# Patient Record
Sex: Female | Born: 1935 | Race: White | Hispanic: No | Smoking: Never smoker
Health system: Southern US, Community
[De-identification: ages and names within clinical notes are randomized; demographics above are authoritative.]

## PROBLEM LIST (undated history)

## (undated) DIAGNOSIS — L089 Local infection of the skin and subcutaneous tissue, unspecified: Secondary | ICD-10-CM

## (undated) DIAGNOSIS — K219 Gastro-esophageal reflux disease without esophagitis: Secondary | ICD-10-CM

## (undated) DIAGNOSIS — F419 Anxiety disorder, unspecified: Secondary | ICD-10-CM

## (undated) DIAGNOSIS — M545 Low back pain, unspecified: Secondary | ICD-10-CM

## (undated) DIAGNOSIS — G4700 Insomnia, unspecified: Secondary | ICD-10-CM

## (undated) DIAGNOSIS — R413 Other amnesia: Principal | ICD-10-CM

## (undated) DIAGNOSIS — R32 Unspecified urinary incontinence: Secondary | ICD-10-CM

## (undated) DIAGNOSIS — R7303 Prediabetes: Secondary | ICD-10-CM

## (undated) DIAGNOSIS — R0600 Dyspnea, unspecified: Secondary | ICD-10-CM

## (undated) DIAGNOSIS — E669 Obesity, unspecified: Secondary | ICD-10-CM

## (undated) DIAGNOSIS — F329 Major depressive disorder, single episode, unspecified: Secondary | ICD-10-CM

## (undated) DIAGNOSIS — F32A Depression, unspecified: Secondary | ICD-10-CM

## (undated) DIAGNOSIS — E039 Hypothyroidism, unspecified: Secondary | ICD-10-CM

## (undated) DIAGNOSIS — H3581 Retinal edema: Secondary | ICD-10-CM

## (undated) DIAGNOSIS — M1990 Unspecified osteoarthritis, unspecified site: Secondary | ICD-10-CM

## (undated) DIAGNOSIS — G4730 Sleep apnea, unspecified: Secondary | ICD-10-CM

## (undated) DIAGNOSIS — I10 Essential (primary) hypertension: Secondary | ICD-10-CM

## (undated) HISTORY — DX: Insomnia, unspecified: G47.00

## (undated) HISTORY — DX: Low back pain, unspecified: M54.50

## (undated) HISTORY — DX: Major depressive disorder, single episode, unspecified: F32.9

## (undated) HISTORY — DX: Anxiety disorder, unspecified: F41.9

## (undated) HISTORY — DX: Depression, unspecified: F32.A

## (undated) HISTORY — DX: Essential (primary) hypertension: I10

## (undated) HISTORY — PX: CHOLECYSTECTOMY: SHX55

## (undated) HISTORY — DX: Gastro-esophageal reflux disease without esophagitis: K21.9

## (undated) HISTORY — DX: Other amnesia: R41.3

## (undated) HISTORY — PX: LUMBAR DISC SURGERY: SHX700

## (undated) HISTORY — PX: OTHER SURGICAL HISTORY: SHX169

## (undated) HISTORY — DX: Low back pain: M54.5

## (undated) HISTORY — DX: Unspecified osteoarthritis, unspecified site: M19.90

## (undated) HISTORY — PX: KNEE SURGERY: SHX244

## (undated) HISTORY — DX: Obesity, unspecified: E66.9

## (undated) HISTORY — DX: Hypothyroidism, unspecified: E03.9

## (undated) HISTORY — DX: Retinal edema: H35.81

## (undated) HISTORY — PX: PARATHYROIDECTOMY: SHX19

---

## 1999-09-12 ENCOUNTER — Encounter: Payer: Self-pay | Admitting: Orthopedic Surgery

## 1999-09-18 ENCOUNTER — Inpatient Hospital Stay (HOSPITAL_COMMUNITY): Admission: RE | Admit: 1999-09-18 | Discharge: 1999-09-21 | Payer: Self-pay | Admitting: Orthopedic Surgery

## 1999-09-21 ENCOUNTER — Inpatient Hospital Stay (HOSPITAL_COMMUNITY)
Admission: RE | Admit: 1999-09-21 | Discharge: 1999-09-25 | Payer: Self-pay | Admitting: Physical Medicine & Rehabilitation

## 1999-09-25 ENCOUNTER — Emergency Department (HOSPITAL_COMMUNITY): Admission: EM | Admit: 1999-09-25 | Discharge: 1999-09-25 | Payer: Self-pay | Admitting: Emergency Medicine

## 1999-09-25 ENCOUNTER — Encounter: Payer: Self-pay | Admitting: Emergency Medicine

## 1999-10-03 ENCOUNTER — Inpatient Hospital Stay (HOSPITAL_COMMUNITY): Admission: RE | Admit: 1999-10-03 | Discharge: 1999-10-06 | Payer: Self-pay | Admitting: Orthopedic Surgery

## 1999-10-11 ENCOUNTER — Encounter: Payer: Self-pay | Admitting: Emergency Medicine

## 1999-10-11 ENCOUNTER — Emergency Department (HOSPITAL_COMMUNITY): Admission: EM | Admit: 1999-10-11 | Discharge: 1999-10-11 | Payer: Self-pay | Admitting: Emergency Medicine

## 2000-03-18 ENCOUNTER — Inpatient Hospital Stay (HOSPITAL_COMMUNITY): Admission: EM | Admit: 2000-03-18 | Discharge: 2000-03-24 | Payer: Self-pay | Admitting: Orthopedic Surgery

## 2000-03-21 ENCOUNTER — Encounter: Payer: Self-pay | Admitting: Orthopedic Surgery

## 2001-07-15 ENCOUNTER — Encounter: Payer: Self-pay | Admitting: Orthopedic Surgery

## 2001-07-17 ENCOUNTER — Inpatient Hospital Stay (HOSPITAL_COMMUNITY): Admission: RE | Admit: 2001-07-17 | Discharge: 2001-07-20 | Payer: Self-pay | Admitting: Orthopedic Surgery

## 2001-09-21 ENCOUNTER — Inpatient Hospital Stay (HOSPITAL_COMMUNITY): Admission: RE | Admit: 2001-09-21 | Discharge: 2001-09-23 | Payer: Self-pay | Admitting: Orthopedic Surgery

## 2001-09-23 ENCOUNTER — Inpatient Hospital Stay (HOSPITAL_COMMUNITY)
Admission: AD | Admit: 2001-09-23 | Discharge: 2001-10-02 | Payer: Self-pay | Admitting: Physical Medicine & Rehabilitation

## 2001-11-16 ENCOUNTER — Inpatient Hospital Stay (HOSPITAL_COMMUNITY): Admission: RE | Admit: 2001-11-16 | Discharge: 2001-11-25 | Payer: Self-pay | Admitting: Orthopedic Surgery

## 2001-11-17 ENCOUNTER — Encounter: Payer: Self-pay | Admitting: Orthopedic Surgery

## 2001-11-25 ENCOUNTER — Inpatient Hospital Stay (HOSPITAL_COMMUNITY)
Admission: RE | Admit: 2001-11-25 | Discharge: 2001-12-04 | Payer: Self-pay | Admitting: Physical Medicine & Rehabilitation

## 2001-12-03 ENCOUNTER — Encounter: Payer: Self-pay | Admitting: Physical Medicine & Rehabilitation

## 2002-01-01 ENCOUNTER — Inpatient Hospital Stay (HOSPITAL_COMMUNITY): Admission: RE | Admit: 2002-01-01 | Discharge: 2002-01-08 | Payer: Self-pay | Admitting: Orthopedic Surgery

## 2002-01-01 ENCOUNTER — Encounter (INDEPENDENT_AMBULATORY_CARE_PROVIDER_SITE_OTHER): Payer: Self-pay | Admitting: *Deleted

## 2002-01-01 ENCOUNTER — Encounter: Payer: Self-pay | Admitting: Orthopedic Surgery

## 2002-07-07 ENCOUNTER — Ambulatory Visit (HOSPITAL_COMMUNITY): Admission: RE | Admit: 2002-07-07 | Discharge: 2002-07-07 | Payer: Self-pay | Admitting: Family Medicine

## 2002-09-13 ENCOUNTER — Other Ambulatory Visit: Admission: RE | Admit: 2002-09-13 | Discharge: 2002-09-13 | Payer: Self-pay | Admitting: Family Medicine

## 2003-01-12 ENCOUNTER — Ambulatory Visit (HOSPITAL_COMMUNITY): Admission: RE | Admit: 2003-01-12 | Discharge: 2003-01-12 | Payer: Self-pay | Admitting: Family Medicine

## 2003-02-11 ENCOUNTER — Encounter: Payer: Self-pay | Admitting: Neurological Surgery

## 2003-02-16 ENCOUNTER — Inpatient Hospital Stay (HOSPITAL_COMMUNITY): Admission: RE | Admit: 2003-02-16 | Discharge: 2003-02-18 | Payer: Self-pay | Admitting: Neurological Surgery

## 2003-02-16 ENCOUNTER — Encounter: Payer: Self-pay | Admitting: Neurological Surgery

## 2003-03-29 ENCOUNTER — Ambulatory Visit: Admission: RE | Admit: 2003-03-29 | Discharge: 2003-03-29 | Payer: Self-pay | Admitting: Unknown Physician Specialty

## 2004-02-03 ENCOUNTER — Encounter: Admission: RE | Admit: 2004-02-03 | Discharge: 2004-02-03 | Payer: Self-pay | Admitting: Neurological Surgery

## 2005-02-13 ENCOUNTER — Ambulatory Visit (HOSPITAL_COMMUNITY): Admission: RE | Admit: 2005-02-13 | Discharge: 2005-02-13 | Payer: Self-pay | Admitting: Gastroenterology

## 2005-11-19 ENCOUNTER — Inpatient Hospital Stay (HOSPITAL_COMMUNITY): Admission: EM | Admit: 2005-11-19 | Discharge: 2005-11-24 | Payer: Self-pay | Admitting: Emergency Medicine

## 2007-02-24 ENCOUNTER — Ambulatory Visit: Payer: Self-pay | Admitting: Endocrinology

## 2007-02-24 ENCOUNTER — Encounter: Payer: Self-pay | Admitting: Endocrinology

## 2007-02-24 ENCOUNTER — Encounter: Payer: Self-pay | Admitting: *Deleted

## 2007-02-24 DIAGNOSIS — M545 Low back pain, unspecified: Secondary | ICD-10-CM | POA: Insufficient documentation

## 2007-02-24 DIAGNOSIS — Z862 Personal history of diseases of the blood and blood-forming organs and certain disorders involving the immune mechanism: Secondary | ICD-10-CM | POA: Insufficient documentation

## 2007-02-24 DIAGNOSIS — E039 Hypothyroidism, unspecified: Secondary | ICD-10-CM | POA: Insufficient documentation

## 2007-02-24 DIAGNOSIS — K219 Gastro-esophageal reflux disease without esophagitis: Secondary | ICD-10-CM | POA: Insufficient documentation

## 2007-02-24 DIAGNOSIS — F3289 Other specified depressive episodes: Secondary | ICD-10-CM | POA: Insufficient documentation

## 2007-02-24 DIAGNOSIS — Z8639 Personal history of other endocrine, nutritional and metabolic disease: Secondary | ICD-10-CM

## 2007-02-24 DIAGNOSIS — I10 Essential (primary) hypertension: Secondary | ICD-10-CM | POA: Insufficient documentation

## 2007-02-24 DIAGNOSIS — F329 Major depressive disorder, single episode, unspecified: Secondary | ICD-10-CM

## 2007-02-24 LAB — CONVERTED CEMR LAB: PTH: 131.7 pg/mL — ABNORMAL HIGH (ref 14.0–72.0)

## 2007-03-30 ENCOUNTER — Encounter: Payer: Self-pay | Admitting: Endocrinology

## 2008-02-14 ENCOUNTER — Ambulatory Visit (HOSPITAL_BASED_OUTPATIENT_CLINIC_OR_DEPARTMENT_OTHER): Admission: RE | Admit: 2008-02-14 | Discharge: 2008-02-14 | Payer: Self-pay | Admitting: Family Medicine

## 2008-02-20 ENCOUNTER — Ambulatory Visit: Payer: Self-pay | Admitting: Internal Medicine

## 2008-07-27 ENCOUNTER — Encounter: Admission: RE | Admit: 2008-07-27 | Discharge: 2008-08-24 | Payer: Self-pay | Admitting: Family Medicine

## 2008-11-10 ENCOUNTER — Emergency Department (HOSPITAL_COMMUNITY): Admission: EM | Admit: 2008-11-10 | Discharge: 2008-11-10 | Payer: Self-pay | Admitting: Emergency Medicine

## 2008-12-15 ENCOUNTER — Encounter (HOSPITAL_COMMUNITY): Admission: RE | Admit: 2008-12-15 | Discharge: 2009-03-15 | Payer: Self-pay | Admitting: General Surgery

## 2009-02-06 ENCOUNTER — Encounter (INDEPENDENT_AMBULATORY_CARE_PROVIDER_SITE_OTHER): Payer: Self-pay | Admitting: General Surgery

## 2009-02-06 ENCOUNTER — Ambulatory Visit (HOSPITAL_COMMUNITY): Admission: RE | Admit: 2009-02-06 | Discharge: 2009-02-08 | Payer: Self-pay | Admitting: General Surgery

## 2009-06-13 ENCOUNTER — Encounter: Payer: Self-pay | Admitting: Internal Medicine

## 2009-06-27 ENCOUNTER — Encounter: Payer: Self-pay | Admitting: Internal Medicine

## 2009-07-13 ENCOUNTER — Encounter: Payer: Self-pay | Admitting: Internal Medicine

## 2009-07-13 ENCOUNTER — Ambulatory Visit (HOSPITAL_BASED_OUTPATIENT_CLINIC_OR_DEPARTMENT_OTHER): Admission: RE | Admit: 2009-07-13 | Discharge: 2009-07-13 | Payer: Self-pay | Admitting: Allergy and Immunology

## 2009-07-15 ENCOUNTER — Ambulatory Visit: Payer: Self-pay | Admitting: Internal Medicine

## 2009-08-18 ENCOUNTER — Ambulatory Visit: Payer: Self-pay | Admitting: Internal Medicine

## 2009-08-18 DIAGNOSIS — G4730 Sleep apnea, unspecified: Secondary | ICD-10-CM

## 2009-08-18 DIAGNOSIS — G4700 Insomnia, unspecified: Secondary | ICD-10-CM | POA: Insufficient documentation

## 2009-08-21 DIAGNOSIS — J309 Allergic rhinitis, unspecified: Secondary | ICD-10-CM | POA: Insufficient documentation

## 2009-08-30 ENCOUNTER — Telehealth: Payer: Self-pay | Admitting: Internal Medicine

## 2009-08-31 ENCOUNTER — Encounter: Payer: Self-pay | Admitting: Internal Medicine

## 2009-09-15 ENCOUNTER — Encounter: Payer: Self-pay | Admitting: Internal Medicine

## 2009-09-24 ENCOUNTER — Encounter: Payer: Self-pay | Admitting: Internal Medicine

## 2010-06-02 ENCOUNTER — Encounter: Payer: Self-pay | Admitting: Neurological Surgery

## 2010-06-12 NOTE — Letter (Signed)
Summary: [facility]  [facility]   Imported By: Lester McLain 08/29/2009 09:20:19  _____________________________________________________________________  External Attachment:    Type:   Image     Comment:   External Document

## 2010-06-12 NOTE — Progress Notes (Signed)
Summary: status of order  Phone Note Call from Patient Call back at Home Phone [phone]   Caller: Patient Call For: young Summary of Call: Checking on the status of cpap order. Initial call taken by: Darletta Moll,  August 30, 2009 11:48 AM  Follow-up for Phone Call        CY sent order to start pt on CPAP on 08-18-2009.  Pt calling stating she still hasn't heard anything from DME company.  Will forward message to PCCs to addres.  Aundra Millet Reynolds LPN  August 30, 2009 11:57 AM   Additional Follow-up for Phone Call Additional follow up Details #1::        lmtcb with [facility] Kristen who is filling in for [place] at [facility]. Stated that she called office and they told her they didn't have an order. Reprinted order and gave order to [facility] and asked that they contact Mr. Arscott today. Rhonda Cobb  August 30, 2009 12:54 PM

## 2010-06-12 NOTE — Assessment & Plan Note (Signed)
Summary: sleep apnea/ mbw   Copy to:  karam Primary Provider/Referring Provider:  Della Goo  CC:  Pulmonary Consult-Dr. Lucie Leather..  History of Present Illness: August 18, 2009- 73 yoF referred by Dr Lucie Leather concerned about sleep problems. She says she has been a a poor sleeper much of her life, with difficulty initiating and maintaining sleep. She is widowed now and alone with no reports about sleep. She had used little medication, until recently Dr Lovell Sheehan has tried trazodone which hasn't helped. A combination of valium for sleep mixed with hydrocodone for pain caused confusion. Bedtime 11PM-1AM. Sleep latency 15 minutes to 2 hours, waking 2-5 x/ night before up at 730AM. Feels sleepy in day, but "busy brain" and can't sleep. 3-4 cups pf coffee daily. Feels badly/ malaise til noon. NPSG- 07/13/09- Delayed sleep onset 1230AM despite trazodone and oxycodone.AHI 3.5/hr, RDI 29.9/hr.  Current Medications (verified): 1)  Protonix 40 Mg  Solr (Pantoprazole Sodium) .... Take 1 By Mouth Qd 2)  Ibuprofen 800 Mg  Tabs (Ibuprofen) .... Take 1 By Mouth Two Times A Day As Needed 3)  Diphenhydramine Hcl 25 Mg  Caps (Diphenhydramine Hcl) .... Take 1 By Mouth At Bedtime As Needed 4)  Zantac 300 Mg Tabs (Ranitidine Hcl) .... Take 1 By Mouth Once Daily 5)  Pantoprazole Sodium 40 Mg Tbec (Pantoprazole Sodium) .... Take 1 By Mouth Once Daily 6)  Zyrtec Hives Relief 10 Mg Tabs (Cetirizine Hcl) .... Take 1 By Mouth Once Daily 7)  Trazodone Hcl 50 Mg Tabs (Trazodone Hcl) .... Take 2-3 At Bedtime As Needed Sleep 8)  Vitamin B-12 2500 Mcg Subl (Cyanocobalamin) .... Once Daily 9)  Magnesium 250 Mg Tabs (Magnesium) .... Take 1 By Mouth Once Daily 10)  Biotin 1000 Mcg Tabs (Biotin) .... Take 1 By Mouth Once Daily  Allergies (verified): 1)  ! Penicillin 2)  ! Cipro 3)  ! Valium 4)  ! * Zovirax  Past History:  Family History: Last updated: 08/18/2009 negative for parathyroid disease Hx of allergies in  family; pt unsure of who had/has allergies. Cancer: mother, father, both grandmothers, cousins; High rate of family members with Cancer.  Social History: Last updated: 08/18/2009 patient widowed and retired;lives alone; Has children Non smoker No ETOH Disabled due to knee injury  Risk Factors: Smoking Status: never (02/24/2007)  Past Medical History: LOW BACK PAIN (ICD-724.2) DEPRESSION (ICD-311) HYPERTENSION (ICD-401.9) GERD (ICD-530.81) HYPOTHYROIDISM NOS (ICD-244.9) HYPERPARATHYROIDISM, HX OF (ICD-V12.2) Insomnia with sleep apnea- NPSG 07/13/09- AHI 3.5, RDI 29.9/hr. Allergic Rhinitis  Past Surgical History: Cholecystectomy Lumbar disc surgery Parathyroidectomy  Family History: negative for parathyroid disease Hx of allergies in family; pt unsure of who had/has allergies. Cancer: mother, father, both grandmothers, cousins; High rate of family members with Cancer.  Social History: patient widowed and retired;lives alone; Has children Non smoker No ETOH Disabled due to knee injury  Review of Systems      See HPI       The patient complains of shortness of breath with activity, non-productive cough, acid heartburn, and sore throat.  The patient denies shortness of breath at rest, productive cough, coughing up blood, chest pain, irregular heartbeats, indigestion, loss of appetite, weight change, abdominal pain, difficulty swallowing, tooth/dental problems, headaches, nasal congestion/difficulty breathing through nose, sneezing, itching, ear ache, anxiety, depression, hand/feet swelling, joint stiffness or pain, rash, change in color of mucus, and fever.    Vital Signs:  Patient profile:   75 year old female Height:      58 inches  Weight:      196.13 pounds BMI:     41.14 O2 Sat:      97 % on Room air Pulse rate:   88 / minute BP sitting:   114 / 80  (right arm) Cuff size:   regular  Vitals Entered By: Reynaldo Minium CMA (August 18, 2009 2:36 PM)  O2 Flow:  Room  air  Physical Exam  Additional Exam:  General: A/Ox3; pleasant and cooperative, NAD, overweight, talkative SKIN: no rash, lesions NODES: no lymphadenopathy HEENT: [place]/AT, EOM- WNL, Conjuctivae- clear, PERRLA, TM-WNL, Nose- clear, Throat- clear and wnl, Mallampati  III NECK: Supple w/ fair ROM, JVD- none, normal carotid impulses w/o bruits Thyroid- normal to palpation CHEST: Clear to P&A HEART: RRR, no m/g/r heard ABDOMEN: Soft and nl; nml bowel sounds; ZOX:WRUE, nl pulses, no edema  NEURO: Grossly intact to observation      Impression & Recommendations:  Problem # 1:  INSOMNIA WITH SLEEP APNEA UNSPECIFIED (ICD-780.51)  The insomnia is chronic, but there may be enough sleep apnea to tie this together. We have discused medical and management options and will try autotitrating CPAP. Also, we will try changing trazodone, which hasn't helped, to temazepam. We spent time discussing sleep hygiene. We will want to emphasize other treatment and support, including cognitive behavioral therapy, in the future.  Medications Added to Medication List This Visit: 1)  Ibuprofen 800 Mg Tabs (Ibuprofen) .... Take 1 by mouth two times a day as needed 2)  Diphenhydramine Hcl 25 Mg Caps (Diphenhydramine hcl) .... Take 1 by mouth at bedtime as needed 3)  Zantac 300 Mg Tabs (Ranitidine hcl) .... Take 1 by mouth once daily 4)  Pantoprazole Sodium 40 Mg Tbec (Pantoprazole sodium) .... Take 1 by mouth once daily 5)  Zyrtec Hives Relief 10 Mg Tabs (Cetirizine hcl) .... Take 1 by mouth once daily 6)  Trazodone Hcl 50 Mg Tabs (Trazodone hcl) .... Take 2-3 at bedtime as needed sleep 7)  Temazepam 15 Mg Caps (Temazepam) .Marland Kitchen.. 1-2 for sleep if needed 8)  Vitamin B-12 2500 Mcg Subl (Cyanocobalamin) .... Once daily 9)  Magnesium 250 Mg Tabs (Magnesium) .... Take 1 by mouth once daily 10)  Biotin 1000 Mcg Tabs (Biotin) .... Take 1 by mouth once daily 11)  Cpap   Other Orders: Consultation Level IV (45409) DME  Referral (DME)  Patient Instructions: 1)  Please schedule a follow-up appointment in 6 weeks for CPAP follow-up 2)  See PCC to start CPAP 3)  Try script temazepam instead of trazodone as a sleep aid. Prescriptions: TEMAZEPAM 15 MG CAPS (TEMAZEPAM) 1-2 for sleep if needed  #50 x 2   Entered and Authorized by:   Waymon Budge MD   Signed by:   Waymon Budge MD on 08/18/2009   Method used:   Print then Give to Patient   RxID:   [id number]

## 2010-06-12 NOTE — Letter (Signed)
Summary: [facility]  [facility]   Imported By: Lester Woodall 08/29/2009 09:21:59  _____________________________________________________________________  External Attachment:    Type:   Image     Comment:   External Document

## 2010-06-12 NOTE — Letter (Signed)
Summary: CMN for CPAP Supplies/[facility]  CMN for CPAP Supplies/[facility]   Imported By: Sherian Rein 09/29/2009 14:26:33  _____________________________________________________________________  External Attachment:    Type:   Image     Comment:   External Document

## 2010-08-17 LAB — CBC
Hemoglobin: 14.3 g/dL (ref 12.0–15.0)
Platelets: 206 10*3/uL (ref 150–400)
RDW: 15.4 % (ref 11.5–15.5)

## 2010-08-17 LAB — CALCIUM
Calcium: 7.9 mg/dL — ABNORMAL LOW (ref 8.4–10.5)
Calcium: 8.2 mg/dL — ABNORMAL LOW (ref 8.4–10.5)
Calcium: 9 mg/dL (ref 8.4–10.5)

## 2010-08-17 LAB — BASIC METABOLIC PANEL
Calcium: 10.5 mg/dL (ref 8.4–10.5)
GFR calc non Af Amer: >60 mL/min (ref >60)
Glucose, Bld: 111 mg/dL — ABNORMAL HIGH (ref 70–99)
Sodium: 140 mEq/L (ref 135–145)

## 2010-08-19 LAB — COMPREHENSIVE METABOLIC PANEL
ALT: 22 U/L (ref 0–35)
AST: 27 U/L (ref 0–37)
Alkaline Phosphatase: 166 U/L — ABNORMAL HIGH (ref 39–117)
CO2: 24 mEq/L (ref 19–32)
GFR calc Af Amer: >60 mL/min (ref >60)
GFR calc non Af Amer: >60 mL/min (ref >60)
Glucose, Bld: 120 mg/dL — ABNORMAL HIGH (ref 70–99)
Potassium: 3.7 mEq/L (ref 3.5–5.1)
Sodium: 139 mEq/L (ref 135–145)
Total Protein: 7.1 g/dL (ref 6.0–8.3)

## 2010-08-19 LAB — URINALYSIS, ROUTINE W REFLEX MICROSCOPIC
Bilirubin Urine: NEGATIVE
Hgb urine dipstick: NEGATIVE
Ketones, ur: 15 mg/dL — AB
Protein, ur: 100 mg/dL — AB
Urobilinogen, UA: 0.2 mg/dL (ref 0.0–1.0)

## 2010-08-19 LAB — DIFFERENTIAL
Basophils Relative: 0 % (ref 0–1)
Eosinophils Absolute: 0 10*3/uL (ref 0.0–0.7)
Eosinophils Relative: 0 % (ref 0–5)
Monocytes Relative: 6 % (ref 3–12)
Neutrophils Relative %: 82 % — ABNORMAL HIGH (ref 43–77)

## 2010-08-19 LAB — GLUCOSE, CAPILLARY: Glucose-Capillary: 138 mg/dL — ABNORMAL HIGH (ref 70–99)

## 2010-08-19 LAB — CBC
Hemoglobin: 14.8 g/dL (ref 12.0–15.0)
RBC: 5.2 MIL/uL — ABNORMAL HIGH (ref 3.87–5.11)

## 2010-09-25 NOTE — Procedures (Signed)
NAME:  Jody Porter, Jody Porter           ACCOUNT NO.:  [id number]   MEDICAL RECORD NO.:  [id number]          PATIENT TYPE:  OUT   LOCATION:  SLEEP CENTER                 FACILITY:  [facility]   PHYSICIAN:  Clinton D. Maple Hudson, MD, FCCP, FACPDATE OF BIRTH:  1937[birth year]10-[birth year]   DATE OF STUDY:  02/14/2008                            NOCTURNAL POLYSOMNOGRAM   REFERRING PHYSICIAN:   INDICATION FOR STUDY:  Insomnia with sleep apnea.   EPWORTH SLEEPINESS SCORE:  Epworth sleepiness score 2/24.  BMI 44.5,  weight 213 pounds, height 58 inches, neck 16 inches.   MEDICATIONS:  Home medications charted and reviewed.   SLEEP ARCHITECTURE:  Total sleep time 325 minutes with sleep efficiency  73.3%.  Stage I was 9.2%, stage II 4.2%, stage III 0.8%, REM 5.8% of  total sleep time.  Sleep latency 66 minutes, REM latency 193.5 minutes,  awake after sleep onset 52 minutes, arousal index 16.4.  Elavil 50 mg  was taken at bedtime.   RESPIRATORY DATA:  Apnea-hypopnea index (AHI) 3.5 per hour.  A total of  19 events was scored, all hypopneas.  Events were not positional.  REM  AHI 28.4.  There were not enough earlier events to permit CPAP titration  by split protocol on the study night.   OXYGEN DATA:  Moderate snoring with oxygen desaturation to a nadir of  81%.  Mean oxygen saturation through the study was 92.6% on room air.  A  total of 2.2 minutes was spent with saturation less than 88%.   CARDIAC DATA:  Normal sinus rhythm.   MOVEMENT-PARASOMNIA:  No significant movement disturbance.  No bathroom  trips.   IMPRESSIONS-RECOMMENDATIONS:  1. Sleep onset was delayed until 11 p.m. and the patient took Elavil      at bedtime.  2. Occasional respiratory sleep disturbance, apnea-hypopnea index 3.5      per hour (normal range 0-5 per hour).  Moderate snoring with oxygen      desaturation to a nadir of 81%.  Events were not positional.     Clinton D. Maple Hudson, MD, [facility], FACP  Diplomate, Biomedical engineer of Sleep Medicine  Electronically Signed    CDY/MEDQ  D:  02/20/2008 13:24:03  T:  02/20/2008 23:43:49  Job:  [id number]

## 2010-09-28 NOTE — H&P (Signed)
[facility]  Patient:    Jody Porter, Jody Porter Visit Number: [id number] MRN: [id number]          Service Type: SUR Location: 4W 0484 01 Attending Physician:  Loanne Drilling Dictated by:   Marcie Bal Troncale, P.A.C. Admit Date:  07/17/2001 Discharge Date: 07/20/2001   CC:         Maricela Bo, M.D.   History and Physical  DATE OF BIRTH:  01-22-1936.  S.S. No.:  [id number]  CHIEF COMPLAINT:  Left knee pain.  HISTORY OF PRESENT ILLNESS:  The patient is a 75 female who has had worsening left knee pain to the point of daily constant pain that is interfering with her activities of daily living.  She has failed conservative measures including synvisc injections without relief.  Because of the findings on her clinical and radiographic examinations she has now elected to undergo surgical intervention on the left knee.  PAST MEDICAL HISTORY: 1. Significant for seasonal allergies. 2. Hypoglycemia. 3. History of constipation with impaction after narcotic pain medications.  PAST SURGICAL HISTORY: 1. She had a right total knee arthroplasty in May 2001, with subsequent fracture and arthrotomy, and debridement in February 2003. 2. She also had an earlier irrigation and debridement of that knee in November of 2001. 3. She has also had cholecystectomy, appendectomy, and C-sections x4.  SOCIAL HISTORY:  The patient is widowed and she lives alone.  She denies any tobacco or alcohol use.   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  FAMILY HISTORY:  Significant for a history of cerebral hemorrhage and hypoglycemia in her father who died at age 42.  Mother is living at age 78 with a history of skin and colorectal cancer.  ALLERGIES:  PENICILLIN causing a rash.  ZOVIRAX causing a rash.  VALIUM causing confusion.  ASPIRIN palpitations.  OGEN and PROGESTERONE nausea and vomiting.  VOLTAREN liver problems.   CODEINE GI upset and HYDROCODONE causing her to be "loopy."  CURRENT MEDICATIONS: 1. Elavil 25 mg q.h.s. 2. Ibuprofen p.r.n. 3. Tylenol p.r.n. 4. Ultracet p.r.n.  REVIEW OF SYSTEMS:  She denies any recent fever or chills.  She denies diplopia, headaches or blurred vision.  Does report some rhinorrhea secondary to seasonal allergies.  Also, questionable about a recent panic attack. Denies any chest pain, shortness of breath, or cough.  No abdominal pain, nausea, vomiting, diarrhea or constipation.  No melena or bright red blood per rectum.  No urinary frequency, hematuria, or dysuria.  No numbness or tingling in her extremities.  PHYSICAL EXAMINATION:  VITAL SIGNS:  Pulse is 100, respiratory rate 20 unlabored. Blood pressure 134/90.  GENERAL:  This is a mildly obese female in no acute distress.  HEENT:  Head is atraumatic, normocephalic.  Pupils are round and reactive to light and extraocular movements are intact.  Oropharynx is clear without redness or lesions.  NECK:  Supple.  No cervical adenopathy.  CHEST: Clear to auscultation bilaterally with no wheezes or crackles.  HEART:  Regular rate and rhythm with no murmur, rub or gallops.  ABDOMEN: Soft, nontender, nondistended with active bowel sounds.  BREAST AND GENITOURINARY:  Not examined and not pertinent to present illness.  SKIN:  Intact without rash or lesions.  MOTOR AND SENSORY EXAMINATIONS:  Grossly intact.  2+ DP and PT pulses in the affected leg.  Range of motion of the left knee is 5 to 95 degrees with severe crepitus.  There is no knee effusion.  She has diffuse tenderness to palpation over the knee.  RADIOGRAPHIC STUDIES:  Demonstrates severe osteoarthritis of the left knee.  IMPRESSION: 1. Osteoarthritis left knee. 2. Seasonal allergies. 3. Hypoglycemia. 4. History of constipation with impaction secondary to narcotic use.  PLAN:  The patient will be admitted to [facility] to undergo a  left total knee arthroplasty by Dr. Lequita Halt on Sep 21, 2001.  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  Also, the patient went to rehabilitation after her first total joint and feels like she will need that again with this surgery.  All questions have been encouraged and answered.  Preoperative labs and signed consents will be obtained. Dictated by:   Marcie Bal Troncale, P.A.C. Attending Physician:  Loanne Drilling DD:  09/15/01 TD:  09/16/01 Job: [id number]

## 2010-09-28 NOTE — Discharge Summary (Signed)
[facility]  Patient:    Jody Porter, Jody Porter            MRN: [id number] Adm. Date:  [id number] Disc. Date: [id number] Attending:  Devoria Albe Dictator:   Marveen Reeks. Dasnoit, P.A.                           Discharge Summary  ADMISSION DIAGNOSES: 1. Right patellar tendon rupture. 2. Right total knee replacement three weeks prior to this presentation. 3. Obesity. 4. Diffuse osteoarthritis. 5. Jehovahs Witness.  DISCHARGE DIAGNOSES: 1. Right patellar tendon rupture. 2. Right total knee replacement three weeks prior to this presentation. 3. Obesity. 4. Diffuse osteoarthritis. 5. Jehovahs Witness.  OPERATION PERFORMED:  Patellar tendon repair, right knee, by Dr. [name], Oct 03, 1999.  HISTORY:  Patient is a 75 year old white female who underwent right total knee replacement three weeks prior to this presentation.  She was doing well until she fell on her flexed knee, ambulating with her walker -- she apparently lost her balance.  She noted the immediate onset of pain in the knee.  She presented to Dr. Lequita Halt for evaluation.  It was found that she had ruptured the patellar tendon.  It was felt that she would need to go to the operating room for repair.  The procedure, risks, benefits, complications and postoperative course were discussed with the patient at length and she was in agreement with this plan.  LABORATORY DATA:  Labs on admission revealed a hemoglobin of 11.3 with hematocrit of 34.9, WBC of 9.1 and 514,000 platelets.  Coagulation studies revealed a slightly elevated pro time at 15.1 and was otherwise normal. Chemistry profile revealed a slightly elevated glucose at 119 and was otherwise normal except for an elevated ALP at 129.  Urinalysis was essentially normal.  HOSPITAL COURSE:  Patient was admitted after undergoing right patellar tendon repair with Restore patch augmentation; this was done by Dr. [name] under general  anesthesia, minimal blood loss.  Patient was restarted on her Coumadin per the pharmacy protocol.  Physical therapy was started at weightbearing as tolerating with no motion of the right knee, wearing knee immobilizer full-time on the right knee.  The first day postop, she was doing very well.  She was afebrile with stable vital signs.  Dressing was clean and dry.  Neurovascular and motor functions were intact.  Second day postop, pro time of 17.0 with an INR of 1.7.  She had a max temperature of 100.1, otherwise, doing well.  Mild-to-moderate pain.  Physical therapy was progressing slowly.  Dressing was changed.  The wound itself looked very good; minimal swelling.  Placed in a new dressing with her knee immobilizer.  The following day, on Oct 06, 1999, she had made excellent progress with her physical therapy, having moderate pain, and this was well-controlled though with p.o. medications.  She was voiding without difficulty and had flatus.  No other complaints of shortness of breath or chest pain.  On exam, her dressing was dry, knee immobilizer was in place, calf was soft and neurovascular and motor functions were intact.  At this time, it was felt that the patient was stable and ready for discharge after additional physical therapy that day, to be discharged in the afternoon of the 26th.  DISCHARGE MEDICATIONS: 1. Percocet 5 mg one or two p.o. q.4-6h. p.r.n. pain. 2. Robaxin 500 mg one p.o. q.8h. p.r.n. muscle spasms. 3. Coumadin 4 mg p.o. q.d. as  per pharmacy protocol.  WOUND CARE:  Her dressing will remain in place until she returns to see [place]. She will use her knee immobilizer full-time.  ACTIVITY:  The patient will continue to ambulate with her walker.  She will be weightbearing as tolerated.  No motion to the knee.  Patient will be seen by a home care physical therapist to assess safety; she will also be seen by home health nurse for pro times, with the first being on Oct 09, 1999.  RETURN APPOINTMENT:  She will return to see Dr. [name] in our office in approximately 10 days time or sooner should she have any problems.  CONDITION ON DISCHARGE:  Stable and improving.  FINAL DISCHARGE DIAGNOSIS:  Rupture of right patellar tendon, three weeks status post right total knee replacement. DD:  10/09/99 TD:  10/12/99 Job: [id number]

## 2010-09-28 NOTE — Discharge Summary (Signed)
[facility]  Patient:    Jody Porter, Jody Porter Visit Number: [id number] MRN: [id number]          Service Type: SUR Location: 4W 0467 01 Attending Physician:  Loanne Drilling Dictated by:   Druscilla Brownie Shela Nevin, P.A. Admit Date:  09/21/2001 Disc. Date: 09/23/01   CC:         Maricela Bo, M.D., [place], [place]   Discharge Summary  PROCEDURE:  On Sep 21, 2001, the patient underwent left total knee replacement arthroplasty, Ralene Bathe, P.A., assistant.  ADMITTING DIAGNOSES: 1. Osteoarthritis of the left knee. 2. Borderline anemia. 3. Episodes of hypoglycemia. 4. Seasonal allergies.  DISCHARGE DIAGNOSES: 1. Osteoarthritis of the left knee. 2. Borderline anemia. 3. Episodes of hypoglycemia. 4. Seasonal allergies. 5. Mild postoperative anemia treated with erythropoietin.  HISTORY OF PRESENT ILLNESS:  This 75 year old lady with deteriorating condition concerning her left knee has now come to the point where she has marked interference with day-to-day activities, constant pain, and no response to Synvisc injections.  X-rays have shown marked deterioration of the joint. It was decided that the patient would benefit for surgical intervention and was admitted for the above procedure.  HOSPITAL COURSE:  The patient tolerated the surgical procedure quite well.  As expected, she had a drop in her hemoglobin, particularly in light of the fact that she did have a chronic anemia preoperatively.  She was treated with erythropoietin as she is a Air traffic controller Witness and, of course, no blood products would be used with her.  She was also placed on iron supplements, Trinsicon one t.i.d.  The erythropoietin was begun on Sep 21, 2001, with 40,000 units subcu q.d. x3 days.  On the day of transfer, her wound was dry, and we were using p.o. medications for her discomfort.  We found that a bed would be available in rehabilitation and we felt this was necessary due to  the fact that the patient did so well with similar program last time and will have minimal help at home.  LABORATORY DATA:  Preoperative hemoglobin of 11.8 with hematocrit of 35.6. Blood chemistries were essentially normal, glucose was slightly elevated at 165.  Final hemoglobin was 9.3.  No chest x-ray, no EKG is seen on this chart.  CONDITION ON DISCHARGE:  Improved, stable.  PLAN:  The patient is transferred to [facility] rehabilitation for an intensive inpatient rehab program.  They are to follow Dr. Homero Fellers Aluisios protocol.  I believe she has one more dose of 40,000 units of Procrit.  Recommend continuing on the iron.  We used Compazine for nausea, and we used Dilaudid for discomfort, Reglan for nausea as well, and Robaxin as a muscle relaxant. Oxycodone was used for her discomfort. Dictated by:   Druscilla Brownie. Shela Nevin, P.A. Attending Physician:  Loanne Drilling DD:  09/23/01 TD:  09/23/01 Job: [id number]

## 2010-09-28 NOTE — Op Note (Signed)
[facility]  Patient:    Jody Porter, Jody Porter Visit Number: [id number] MRN: [id number]          Service Type: SUR Location: 4W 0484 01 Attending Physician:  Loanne Drilling Dictated by:   [name], M.D. Proc. Date: 07/17/01 Admit Date:  07/17/2001                             Operative Report  PREOPERATIVE DIAGNOSIS:  Right knee infection.  POSTOPERATIVE DIAGNOSIS:  Right knee infection.  PROCEDURE:  Irrigation and debridement, right knee.  SURGEON:  [name], M.D.  ASSISTANT:  Avel Peace, P.A.-C.  ANESTHESIA:  General.  ESTIMATED BLOOD LOSS:  Minimal.  DRAINS:  Hemovac x1.  TOURNIQUET TIME:  26 minutes ate 350 mmHg.  COMPLICATIONS:  None.  CONDITION:  Stable to recovery.  BRIEF CLINICAL NOTE:  [name] is a 75 year old female who had a right total knee done approximately two years ago. Postop course was complicated by a fall with patellar tendon rupture. She had that repaired and eventually developed infection. She was treated with IV antibiotics and did fine. Recently she developed increased pain and had an area on the skin that was draining. She presents now for irrigation and debridement. We discussed possible options if the joint was infected and we just opted today to wash it out and see how much improvement we could obtain as opposed to doing a resection arthroplasty.  DESCRIPTION OF PROCEDURE:  After successful administration of general anesthetic, a tourniquet was placed high on the right thigh and right lower extremity prepped and draped in the usual sterile fashion. Extremities elevated and the tourniquet was inflated to 300. The mid portion of her incision was utilized, skin cut with a 10 blade through subcutaneous tissue. There was an area where there was some abnormal appearing sinus. The tract is completely excised. This goes to the joint and there was some cloudy fluid draining from the joint. This was done  for Gram stain C&S. We then did a mini arthrotomy, opened the joint and removed abnormal appearing synovium. Pulsatile lavage was then used to thoroughly irrigate the joint with 3 liters of saline solution. We utilized another liter of antibiotic solution. The joint was then inspected and the remainder of the tissue looks normal. We then closed the arthrotomy over a Hemovac drain with interrupted #1 PDS. The tourniquet was released with a total time of 26 minutes. The subcu was closed with interrupted 2-0 Vicryl and skin with staples. The incision was clean and dry and a bulky sterile dressing applied. The patient was awakened and transported to recovery in stable condition. Dictated by:   [name], M.D. Attending Physician:  Loanne Drilling DD:  07/17/01 TD:  07/17/01 Job: [id number]

## 2010-09-28 NOTE — Discharge Summary (Signed)
NAME:  Jody Porter, Jody Porter                     ACCOUNT NO.:  [id number]   MEDICAL RECORD NO.:  [id number]                   PATIENT TYPE:  INP   LOCATION:  NA                                   FACILITY:  [facility]   PHYSICIAN:  Gus Rankin. Aluisio, M.D.              DATE OF BIRTH:  1936/04/09   DATE OF ADMISSION:  11/16/2001  DATE OF DISCHARGE:  11/25/2001                                 DISCHARGE SUMMARY   ADMITTING DIAGNOSES:  1. Infected right total knee replacement arthroplasty.  2. Obesity.  3. Jehovah's Witness.  4. Gastroesophageal reflux disease.   DISCHARGE DIAGNOSES:  1. Infected right total knee arthroplasty status post resection arthroplasty     right knee with placement of antibiotic impregnated spacer.  2. Obesity.  3. Jehovah's Witness.  4. Gastroesophageal reflux disease.  5. Moderate blood loss anemia.  Did not require transfusion.  6. PICC line placement.   PROCEDURE:  The patient was taken to the operating room on November 16, 2001 and  underwent resection of a right knee arthroplasty with placement of an  antibiotic impregnated spacer.  Surgeon Dr. Lequita Halt.  Assistant Avel Peace, P.A.-C.  Surgery under general anesthesia.  Hemovac drain x1.   BRIEF HISTORY:  The patient is a 75 year old female who had previous right  total knee arthroplasty done approximately two years ago.  Postoperatively  she developed a patella tendon rupture.  She had a repair and subsequently  developed an infection.  She has had two debridements with eventual  eradication, but then had a reinfection with a sinus tract.  At this point  due to the infection she has elected to proceed with stage I resection of  the right total knee arthroplasty.   LABORATORY DATA:  CBC on admission:  Hemoglobin 11.0, hematocrit 33.5, white  cell count 6.4, red cell count 4.38.  Differential within normal limits.  Serial CBCs were followed.  Hemoglobin dropped postoperatively down to 7.8,  then to 6.9, and  got as low as 6.4.  Was back up to 7.1 prior to discharge.  PT/PTT on admission were 14.2 and 36, respectively.  Serial pro times were  followed for Coumadin protocol.  Last noted PT/INR 22.1 and 2.2,  respectively.  Chemistry panel on admission:  Minimally elevated glucose of  130, elevated ALT of 162.  Remaining chemistry panel all within normal  limits.  Serial BMETs were followed.  Sodium did drop postoperative down  from 138 to 134, was back up to 135.  Calcium dropped from 9.7 to 8.3, was  back up to 8.4.  Vancomycin trough level taken on November 23, 2001 less than  5.0.  Urinalysis on admission was negative.  Fluid culture taken of the  right knee at the time of surgery:  Gram stain showed no organisms; however,  rare Enterococcus species grew out.  This was positive for Enterococcus  species x2 cultures.  The anaerobic culture of  the Gram stain showed no  organisms and no anaerobes on the culture x2.   EKG dated July 15, 2001:  Normal sinus rhythm, normal EKG, no significant  change since last tracing of August 11, 1999 confirmed by Dr. Corliss Marcus.  Chest x-ray November 17, 2001:  Placement of PICC line positioned with its tip at  the distal SVC and right atrial junction.  Postoperative chest confirms  appropriate catheter position.   HOSPITAL COURSE:  The patient was admitted to [facility], taken to  OR, underwent above stated procedure without complications.  The patient  tolerated procedure well.  Was later transferred to recovery room and then  to the orthopedic floor for continued postoperative care.  Vital signs were  followed.  Hemovac drain placed at time of surgery.  Hemovac drain was  pulled on postoperative day two.  H&Hs were followed very closely.  She had  blood loss anemia, but did not require transfusion.  Hemoglobins were  followed closely and found in the laboratory data section of this chart.  Two fluid cultures, two anaerobe cultures were taken at the time  of surgery.  The two fluid cultures did prove to be positive for Enterococcus species.  Anaerobe cultures were negative.  She continued on her iron supplement.  She  had an antibiotic impregnated spacer placed.  She was nonweightbearing to  touchdown weightbearing.  She was able to do physical therapy which was  ordered postoperatively; however, there was no specific range of motion  exercises due to the spacer being placed.  Pharmacy was consulted to assist  with vancomycin dosing and also for Coumadin protocol.  PICC line was placed  on November 17, 2001 for IV antibiotics.  Consult was called in to rehabilitation  services.  The patient was seen in consultation by rehabilitation for  possible evaluation stay on the rehabilitation services for continued IV  antibiotics, appropriate skilled care.  She was initially placed on IV Ancef  following surgery.  This was discontinued and was started on vancomycin on  day three.  The pharmacy was consulted at that point to assist with  vancomycin protocol.  The fluid cultures did prove to be positive by  postoperative day four.  She was very slow to progress with physical therapy  and there was some miscommunication between the patient and therapist of  what she was actually allowed to do.  The patient was told she was not to do  any physical therapy; however, she was allowed to do ambulation and gait  training with a touchdown weightbearing status.  However, she was not to do  any formal range of motion activities with the knee since the knee  arthroplasty had been resected.  She continued on IV antibiotics and was  eventually weaned over to p.o. medications for pain control.  She did have  intermittent bouts of pain throughout the hospital.  Wound care was  followed.  Dressing change was initiated on postoperative day three.  The  wound was healing well.  She did continue to have some intermittent drainage from the distal incision which is felt to be  related to some of her  postoperative swelling.  Appeared to be serosanguineous in nature.  Otherwise, the incision appeared to be healing well.  On November 24, 2001 she  was getting up with physical therapy and unfortunately slipped and lost her  footing.  She did not fall hard onto the floor.  She, with assistance, was  able to  sit down softly.  She did not have any increased pain in the knee  and she was not complaining of any back pain, neck pain, or leg pain.  She  was helped back to bed.  She continued on her IV antibiotics.  It was noted  on the following day of November 25, 2001 that a bed became available on the  subacute unit on [facility].  They were able to locate a bed for  skilled care.  Since the patient was doing much better, it was decided that  she would be transferred over to subacute unit for continued care.   DISCHARGE PLAN:  The patient was transferred to [facility].   DISCHARGE DIAGNOSES:  Please see above.   DISCHARGE MEDICATIONS:  1. Ultram one or two q.4-6h. as needed for mild pain.  2. Vancomycin IVprotocol.  3. Coumadin protocol.  4. Ferrous sulfate 325 mg p.o. t.i.d. with meals.  5. Colace 100 mg p.o. b.i.d.  6. Percocet one or two q.4-6h. p.r.n. for pain.  7. Tylenol one or two q.4-6h. p.r.n. for mild pain or temperature.  8. Elavil 50 mg one p.o. q.d.  9. Robaxin 500 mg p.o. q.6h. p.r.n. spasm.  10.      Restoril 15-30 mg p.o. q.h.s.   ACTIVITY:  She is touchdown weightbearing to the right lower extremity.  Continue gait training ambulation.  Touchdown weightbearing.  No active  range of motion or no passive range of motion to the knee at this point.  Knee immobilizer when up out of bed.  She may have the knee immobilizer off  while in bed and not doing any activity.  Continue with dressing changes  daily.   FOLLOW UP:  The patient is to follow up with Dr. Lequita Halt in the office  following discharge from the subacute unit.    DISPOSITION:  [facility].   CONDITION ON DISCHARGE:  Improving.     Alexzandrew L. Julien Girt, P.A.              Gus Rankin Aluisio, M.D.    ALP/MEDQ  D:  12/30/2001  T:  12/30/2001  Job:  [id number]

## 2010-09-28 NOTE — Op Note (Signed)
[facility]  Patient:    Jody Porter, Jody Porter Visit Number: [id number] MRN: [id number]          Service Type: SUR Location: 4W 0467 01 Attending Physician:  Loanne Drilling Dictated by:   [name], M.D. Proc. Date: 09/21/01 Admit Date:  09/21/2001                             Operative Report  PREOPERATIVE DIAGNOSIS:  Osteoarthritis, left knee.  POSTOPERATIVE DIAGNOSIS:  Osteoarthritis, left knee.  PROCEDURE:  Left total knee arthroplasty.  SURGEON:  [name], M.D.  ASSISTANT:  Ralene Bathe, P.A.  ANESTHESIA:  General.  ESTIMATED BLOOD LOSS:  Minimal.  DRAINS:  Hemovac x1.  COMPLICATIONS:  None.  TOURNIQUET TIME:  55 minutes at 350 mmHg.  CONDITION:  Stable to recovery.  BRIEF CLINICAL NOTE:  Jody Porter is a 75 year old female with severe osteoarthritis of the left knee with pain refractory to nonoperative management. She presents now for left total knee arthroplasty.  DESCRIPTION OF PROCEDURE:  After the successful administration of general anesthetic, a tourniquet was placed high the left thigh and left lower extremity prepped and draped in the usual sterile fashion. The extremity was wrapped in Esmarch, knee flexed, tourniquet inflated to 350 mmHg. A midline incision is made through the skin and then she had a very thick layer of subcutaneous tissue down to the extensor mechanism. A fresh blade was used to make a medial parapatellar arthrotomy. The soft tissue over the proximal medial tibia subperiosteally elevated to the joint line with a knife to the semimembranosus bursa with curved osteotome. The soft tissue over the proximal lateral tibia was also elevated with attention being paid to avoiding the patella tendon and the tibial tubercle. The patellofemoral ligament was cut, patella everted, and knee flexed to 90 degrees. The ACL and PCL removed.  A drill was used to create a starting hole in the distal femur and the  canal was irrigated. A five degree left valgus alignment guide was placed. Referencing off the posterior condyles, rotations marked, block pinned to remove 9 mm off the distal femur. Distal femoral resection was made with an oscillating saw.  A sizing block was placed, size 2.5 is the most appropriate. Rotation is marked off the epicondylar axis. The AP block is placed and the anterior and posterior cuts made with a 2.5.  The tibia subluxed forward, menisci removed. The extramedullary tibial alignment guide is placed referencing proximally to the medial third tibial tubercle and distally on the second metatarsal axis at the tibial crest. The block is then pinned to remove 10 mm off the nondeficient lateral side. Tibial resection is made with the oscillating saw. The cut surface shows a size 2 will be most appropriate. The intercondylar and chamfer cuts were then made on the femur. A size 2.5 posterior stabilized femur, size 2 fixed bearing tibial tray and 10 mm posterior stabilized insert placed. Full extension is achieved with excellent varus valgus balance past 100 degrees of flexion. Rotation is marked and corresponds to the second metatarsal axis. The patella is then everted, ______ to be 21 mm with free resection taken to 13 and 32 mm template placed. Lug holes drilled, trial placed and tracks normally. The proximal tibia is then prepared with the modular drill and keel punch. Osteophytes removed off the posterior femur with the trial in place. The wound is then copiously irrigated with the pulsatile lavage. Cement is  mixed and once ready for implantation, a size 2.5 posterior stabilized femur, size 2 fixed bearing tibial tray and 32 patella are cemented into place, patella is held with a clamp. A 10 mm trial insert is placed, knee held in full extension and all extruded cement removed. Once the cement is fully hardened, the permanent 10 mm posterior stabilized insert is impacted into  the tibial tray. Excellent balance is noted throughout range of motion. The wound is copiously irrigated with antibiotic solution, extensor mechanism closed over a Hemovac drain with interrupted #1 PDS. Flexing against gravity is at 110 degrees at which point her calf hits her thigh. The subcu is then closed in two layers with interrupted 2-0 Vicryl.  The tourniquet is released for a total time of 55 minutes. The subcuticular layer is closed with a continuous subcuticular 4-0 monocryl. The incision is clean and dry and Steri-Strips and bulky sterile dressing applied. The drain is hooked to suction, placed into a knee immobilizer, awakened and transported to recovery in stable condition. Dictated by:   [name], M.D. Attending Physician:  Loanne Drilling DD:  09/21/01 TD:  09/22/01 Job: [id number]

## 2010-09-28 NOTE — H&P (Signed)
Jody Porter, Jody Porter                     ACCOUNT NO.:  [id number]   MEDICAL RECORD NO.:  [id number]                   PATIENT TYPE:  INP   LOCATION:  0480                                 FACILITY:  [facility]   PHYSICIAN:  Gus Rankin. Aluisio, M.D.              DATE OF BIRTH:  02-18-1936   DATE OF ADMISSION:  11/16/2001  DATE OF DISCHARGE:  11/25/2001                                HISTORY & PHYSICAL   CHIEF COMPLAINT:  Infected right knee.   HISTORY OF PRESENT ILLNESS:  The patient is a 75 year old female who has a  known history of right total knee replacement.  She has developed problems  and has been seen and followed on an outpatient basis for possible infection  in the right knee.  It was determined that she did have infection in and  around the prosthesis.  Due to the significant findings and it was felt that  the patient would best be served undergoing a two-staged surgical resection  and reimplantation of the right total knee arthroplasty.  The risks and  benefits of the procedure have been discussed with the patient at length.  It is known that she is Jehovah's' Witness and has had previous history of  blood loss anemia.  She does not accept transfusions.  The risks and  benefits have been discussed at length with the patient and she has elected  to proceed with the first of two stages in surgical resection and  reimplantation.   ALLERGIES:  PENICILLIN causes rash.  VOLTAREN causes elevation of liver  enzymes. CODEINE and HYDROCODONE causes hallucinations.  ASPIRIN causes  increased heart rate.  ZOVIRAX caused a rash.  ESTROGEN feels horrible.   CURRENT MEDICATIONS:  1. Oxycodone p.r.n. pain.  2. Cipro 500 mg one half tablet twice a day.  3. Amitriptyline 50 mg a day.  4. Iron.   PAST MEDICAL HISTORY:  History of anemia. The patient is known Jehovah's'  Witness.  History of osteoarthritis.  Obesity.  Gastroesophageal reflux  disease.   PAST SURGICAL HISTORY:  Repair  of ruptured patella tendon on the right.  Right total knee replacement arthroplasty in 2001.  Debridement of right  knee March 2003.  Cholecystectomy.  Left knee in March 2003.   SOCIAL HISTORY:  The patient is a 75 year old female. She denies use of  alcohol products and tobacco products.  She is a known Jehovah's' Witness.   FAMILY HISTORY:  Noncontributory.   REVIEW OF SYSTEMS:  GENERAL:  No fever, chills, or night sweats.  NEUROLOGIC:  No seizures, syncope, or paralysis.  RESPIRATORY:  No shortness  of breath, productive cough, or hemoptysis.  CARDIOVASCULAR:  No chest pain,  angina or orthopnea.  GI:  No nausea, vomiting, diarrhea or constipation.  GU:  No dysuria, hematuria or discharge.  MUSCULOSKELETAL:  Pertinent to  that of the right knee found in history of present illness.   PHYSICAL EXAMINATION:  GENERAL  APPEARANCE:  The patient is a 75 year old  white female well-developed, well-nourished, overweight, obese. She is alert  and oriented, cooperative and pleasant at the time of the examination.  VITAL SIGNS:  Temperature 98, pulse 102, respiratory rate 18, blood pressure  110/70.  HEENT:  Normocephalic and atraumatic.  Pupils are equal, round and reactive  to light.  Few dentition. Otherwise oropharynx is clear.  NECK:  Supple.  LUNGS:  Clear to auscultation anterior and posterior chest wall.  CARDIOVASCULAR:  Regular rate and rhythm with no murmurs.  ABDOMEN:  Large, protuberant abdomen, bowel sounds present.  RECTAL, BREASTS, GENITALIA:  Examinations not done, no pertinent to present  illness.  EXTREMITIES:  Limited to the right lower extremity.  The patient does have  some swelling and fusion noted in and about the right knee with some mild  erythema.  Pain noted on passive range of motion.  Knee is stable.   IMPRESSION:  1. Infected right total knee.  2. Obesity.  3. Gastroesophageal reflux disease.  4. Jehovah's' Witness.  5. History of postoperative blood loss  anemia.   PLAN:  The patient will  be admitted to [facility] to undergo  resection of the right total knee arthroplasty.  Risks and benefits have  been discussed and the patient subsequently admitted to the hospital.      Alexzandrew L. Julien Girt, P.A.              Gus Rankin Aluisio, M.D.    ALP/MEDQ  D:  12/30/2001  T:  12/30/2001  Job:  [id number]

## 2010-09-28 NOTE — Discharge Summary (Signed)
[facility]  Patient:    Jody Porter, Jody Porter            MRN: [id number] Adm. Date:  [id number] Disc. Date: [id number] Attending:  Loanne Drilling Dictator:   Alexzandrew L. Perkins, P.A.-C.                           Discharge Summary  ADMISSION DIAGNOSES: 1. Superficial versus deep infection right total knee with draining sinus    tract. 2. Status post right total knee replacement arthroplasty. 3. Status post repair patellar tendon rupture. 4. History of hypoglycemia. 5. History of constipation and colonic impaction. 6. Seasonal allergies.  DISCHARGE DIAGNOSES: 1. Superficial versus deep infection right total knee with draining sinus    tract. 2. Status post right total knee replacement arthroplasty. 3. Status post repair patellar tendon rupture. 4. History of hypoglycemia. 5. History of constipation and colonic impaction. 6. Seasonal allergies. 7. Right knee infection/Enterococcus species.  PROCEDURE:  The patient was taken to the OR on March 19, 2000, and underwent irrigation and debridement of the right knee.  Surgeon:  Dr. Homero Fellers Aluisio. Surgery under general anesthesia.  Hemovac x 1.  Tourniquet time of 17 minutes at 400 mmHg.  BRIEF ADMISSION HISTORY:  The patient is a 75 year old female who had a total knee arthroplasty done approximately six months ago, then sustained a patella tendon rupture approximately two weeks postop.  She had a patellar tendon repair.  She presented with increased pain of about five days with draining from the inferior aspect of the old incision.  She was subsequently admitted to the hospital for IV antibiotics and also irrigation and debridement via surgery.  LABORATORY AND X-RAY DATA:  CBC on admission showed a hemoglobin of 12.3, hematocrit of 37.6, white cell count 9.6, red cell count 4.93.  Follow-up CBC on March 20, 2000, showed a hemoglobin drop down to 9.3, hematocrit of 28.6, white count of  9.5, red cell count of 3.69.  Differential on admission was normal.  Follow-up differential on March 20, 2000, showed an increase in neutrophils from 69 to 81.  Sed rate on admission was elevated at 36.  PT/ptt on admission were 14.2 and 35 respectively with an INR of 1.2.  BMET on admission:  Slightly elevated BUN of 29, elevated calcium of 11, otherwise BMET within normal limits.  Tissue culture taken at time of surgery on March 19, 2000, showed Grams smear with no organisms, no white cells. Culture grew out rate Enterococcus species sensitive to ampicillin, gentamicin, and vancomycin.  Secondary tissue culture taken on March 19, 2000:  Grams smear no wbcs, no organisms seen, also confirmed enterococcus species.  HOSPITAL COURSE:  The patient was admitted to [facility], placed at bed rest, placed on IV, and started on IV antibiotics.  She was taken to the operating room on the following day to undergo the above stated procedure. She tolerated the procedure well and has one Hemovac drain placed at the time of surgery.  Findings at the time of surgery appear to be fat necrosis with a sinus tract to the patella tendon.  There was no component loosening appreciated.  Dr. Lequita Halt decided to leave the drain in for a couple of days following surgery.  Also, postop she was taken to the recovery room and then to the orthopedic floor for recovery.  She had a PICC line placed postoperatively for appropriate IV antibiotics.  Drain was pulled on postop  day #2.  Discharge planning was consulted to assist with assistance for posthospital care and also arrangement of six weeks of IV antibiotics.  The patient had some intermittent nausea following surgery.  This was monitored closely, and antiemetics were used as necessary.  She was also placed back on Pepcid which she took at home as needed.  Antibiotics were adjusted due to sensitivities.  Ancef was discontinued; she was started on  ampicillin.  She was later switched over to vancomycin.  It was decided that vancomycin would be the drug of choice due to her PENICILLIN allergy.  Discharge planning arranged for IV antibiotics.  Once the patient was doing quite better, it was felt the patient could be discharged home on March 24, 2000.  DISCHARGE PLAN:  The patient is discharged on March 24, 2000.  DISCHARGE MEDICATIONS: 1. IV vancomycin x 6 weeks. 2. Darvocet p.r.n. pain. 3. Robaxin p.r.n. spasm. 4. Enteric-coated aspirin q.d.  DISCHARGE INSTRUCTIONS:  Diet:  As tolerated.  Activity:  As tolerated.  DISCHARGE FOLLOWUP:  Follow up middle or the end of next week following discharge.  Call the office for an appointment.  DISPOSITION:  Home.  CONDITION ON DISCHARGE:  Improved. DD:  04/24/00 TD:  04/25/00 Job: [id number]

## 2010-09-28 NOTE — H&P (Signed)
[facility]  Patient:    Jody Porter, Jody Porter            MRN: [id number] Adm. Date:  [id number] Disc. Date: [id number] Attending:  Devoria Albe Dictator:   Della Goo, P.A.-C.                         History and Physical  This is being dictated from the records, as the initial history and physical is  unable to be found, and a repeat is being done at this later date.  CHIEF COMPLAINT:  Right knee pain.  HISTORY OF PRESENT ILLNESS:  Ms. Sensabaugh is a 75 year old white female with progressive pain and dysfunction of both knees, with the right being more symptomatic than the left.  Unfortunately she has been unable to receive relief of her discomfort with conservative measures, and is now having pain that is interfering with her ability to do activities of daily living.  X-rays have revealed varus deformities with bone on bone changes of both knees.  Due to her  significant symptoms, as well as her findings on x-ray, it was felt she would require surgical intervention.  Being that her right knee is most symptomatic, he is being admitted at this time to undergo a right total knee replacement.  Due to the patients religion of Jehovahs Witness, and being unable to accept blood products, she did receive Procrit injections prior to this admission. She has signed a legal consent form asking that no blood products be given during this hospitalization.  CURRENT MEDICATIONS: 1. Effexor 75 mg q.d. 2. Advil p.r.n. 3. Tylenol p.r.n. 4. Pepcid AC over-the-counter p.r.n.  ALLERGIES:  ASPIRIN, PENICILLIN, ZOVIRAX, OGEN, VOLTAREN, AND PROGESTERONE. Specific reactions are unknown, other than PENICILLIN causes a rash.  PAST MEDICAL HISTORY: 1. Significant for an anxiety disorder, which she feels is related to the loss    of her husband in October 2000. 2. Occasional gastroesophageal reflux symptoms. 3. She has occasional environmental allergies. 4.  The patient had recent onset of borderline hypertension; however, is not    using any type of medication, and is trying to control this by diet.  PAST SURGICAL HISTORY: 1. C-section x 4. 2. Gallbladder in 1968. 3. Appendectomy in 1968.  SOCIAL HISTORY:  The patient lives alone.  She has no intake of tobacco or alcohol. The patients family medical doctor is Dr. Maricela Bo.  FAMILY HISTORY:  Significant for "multiple cancers" and arthritis.  She is unable to give specifics regarding these multiple cancers.  REVIEW OF SYSTEMS:  CNS:  The patient denies blurred vision, double vision, seizure disorder, headaches, or paralysis.  She does wear corrective lenses. CARDIORESPIRATORY:  No chest pain, shortness of breath, cough, or sputum production, or hemoptysis.  GU/GI:  No nausea, vomiting, diarrhea, or constipation. No dysuria, hematuria, melena, or blood stools.  When she does have occasional heartburn, she uses over-the-counter Pepcid AC.  MUSCULOSKELETAL:  As per the history of present illness.  HEMATOLOGIC:  She has no history of jaundice, hepatitis, anemia, bleeding tendencies, or blood clots.  Once again she will not receive blood products.  PHYSICAL EXAMINATION:  VITAL SIGNS:  The patient is afebrile, pulse 78 and regular, blood pressure 140/98.  GENERAL:  She is a well-developed, well-nourished white female, alert and oriented x 4, in no acute distress.  HEENT:  Normocephalic, atraumatic.  Pupils equal, round, reactive to light and accommodation.  Extraocular movements intact.  Nose is without  drainage. Oropharynx without erythema or edema.  NECK:  Supple, no adenopathy, no thyromegaly.  LUNGS:  Clear to auscultation.  HEART:  A regular rate and rhythm.  No murmur heard.  ABDOMEN:  Soft, nontender. Bowel sounds present.  GENITOURINARY/RECTAL/BREASTS:  Not performed, not pertinent to the present illness.  EXTREMITIES:  The patient has varus deformity of both  knees, right greater than  left.  She has pain with range of motion and with palpation of the joint line.  There is no ligamentous instability.  There is crepitus with range of motion. Distally pulses are +1 dorsalis pedis bilaterally.  There is no cyanosis, clubbing, or edema of the lower extremities.  IMPRESSION: 1. End-stage osteoarthritis bilateral knees, right greater than left. 2. Anxiety. 3. Borderline hypertension. 4. Occasional reflux. 5. Jehovahs Witness, requiring no use of blood products.  PLAN:  The patient will be admitted to the hospital to undergo a right total knee replacement.  This will be performed by Dr. [name].  Most likely the patient will require inpatient rehabilitation prior to discharge to her home, as she will require independence prior to returning to her home. DD:  11/07/99 TD:  11/07/99 Job: [id number]

## 2010-09-28 NOTE — Op Note (Signed)
[place]  Patient:    Jody Porter, Jody Porter            MRN: [id number] Proc. Date: 09/18/99 Adm. Date:  [id number] Attending:  [name] V                           Operative Report  PREOPERATIVE DIAGNOSIS:  Osteoarthritis, right knee.  POSTOPERATIVE DIAGNOSIS:  Osteoarthritis, right knee.  OPERATION PERFORMED:  Right total knee arthroplasty.  SURGEON:  Trudee Grip, M.D.  ASSISTANT:  Alexzandrew L. Perkins, P.A.-C.  ANESTHESIA:  General.  ESTIMATED BLOOD LOSS:  Minimal.  DRAINS:  Hemovac x 1.  TOURNIQUET TIME:  44 minutes at 375 and down for 10 minutes, then up for 33 at 400.  COMPLICATIONS:  None.  CONDITION:  Stable to recovery.  INDICATIONS FOR PROCEDURE:  Jody Porter is a 75 year old female with severe osteoarthritis of both knees which has been refractory to nonoperative management.  She is now for right total knee arthroplasty as it has been more symptomatic.  DESCRIPTION OF PROCEDURE:  After successful administration of general anesthetic, a tourniquet was placed high on her right thigh.  The right lower extremity was prepped and draped in the usual sterile fashion.  The extremity was wrapped with an Esmarch and the tourniquet was inflated to 375 secondary to massive soft tissue distribution in the thigh.  Knee was flexed and incision made with a 10 blade.  A very thick layer of subcutaneous tissue was cut down to the extensor mechanism.  Subcutaneous flaps were elevated and then a medial parapatellar arthrotomy was made with a fresh blade.  The soft tissue over the proximal medial tibial subperiosteally elevated at the joint line with a knife and the semimembranosus broached with a curved osteotome.  Soft tissue of the proximal lateral tibia was also elevated.  Careful attention was paid to avoiding injury to patellar tendon or tibial tubercle.  The lateral patellofemoral ligament was then cut, patella everted and  knee flexed to 90 degrees.  ACL and PCL were removed and then the drill holes made for the alignment guide.  The 5 degree right valgus alignment guide was placed and the distal femoral cutting block was pinned to remove 9 mm of the distal femur. Distal femoral resection was made and the sizing block placed and the most appropriate size was 2-1/2.  The rotation was marked and corresponded with the epicondylar axis.  The size 2.5 anterior posterior cutting block was placed and the anterior posterior cuts were made.  The tibia was then subluxed forward and the menisci removed.  The extramedullary tibial alignment guide was then placed referencing proximally at the medial aspect of the tibial tubercle and distally on the tibial crest and second metatarsal axis.  The block was then ____________ 10 mm over the nondeficient lateral side.  This barely skimmed the medial side thus an additional 2 mm was taken.  The intercondylar block was then placed on the femur and the intercondylar Chamfer cuts made.  The trial size 2.5 femur and size 2 tibia were placed with a 12.5 mm posterior stabilized insert.  She achieved full extension with excellent varus valgus balance.  She was also flexed beyond 90 with excellent balance in the left off.  Rotation was marked and corresponds to the second metatarsal axis.  Patella was then everted and ____________ measured to be 21 and free hand resection taken down to 12 mm.  A 35  mm template was placed and local drilled.  A trial patella was placed and tracked normally.  At this point the trial patella was removed.  Trial tibial removed and the femur palpated and there was no evidence of any posterior osteophytes.  The tibial base plate was then pinned to the proximal tibia and the ____________ drill and keel punch made.  The cut bone surface was then prepared with pulsatile lavage and cement mixed.  ____________ implantation tibial component, then the femoral  component were cemented and a 12.5 mm spacer was placed with the knee held in full extension.  All extruded cement was removed.  The patella was also cemented and held with the clamp.  Once the cement was fully hardened, patella clamp was removed and the permanent 12.5 mm posterior stabilized insert was placed in the size 2 tibial tray.  The wound was then copiously irrigated with antibiotic solution and the extensor mechanism closed over one limb of the Hemovac drain.  Subcutaneous was closed in three layers with interrupted 0, then 2-0 Vicryl.  Skin was closed with staples.  Skin was cleaned and dried and a bulky sterile dressing applied.  The patient was then awakened and transported to recovery in stable condition. DD:  09/18/99 TD:  09/19/99 Job: [id number]

## 2010-09-28 NOTE — Op Note (Signed)
[place]  Patient:    Jody Porter, Jody Porter Visit Number: [id number] MRN: [id number]          Service Type: [facility] Location: [id number] Attending Physician:  Jody Porter Dictated by:   Jody Porter, P.A. Proc. Date: 11/25/01 Admit Date:  11/25/2001   CC:         Jody Porter, M.D.  Jody Porter, M.D.   Operative Report  DISCHARGE DIAGNOSES: 1. Status post removal of right total knee arthroplasty and resection    secondary to infection. 2. Anemia.  HISTORY OF PRESENT ILLNESS:  The patient is a 75 year old white female with a history of right total knee arthroplasty in March 2001 and left total knee arthroplasty in May 2003, who presents with infected right total knee.  The patient underwent a removal of arthroplasty and placement of an antibiotic spacer and cement on July 7 by Dr. Lequita Porter.  PT reports at this time that patient is ambulating minimum assist 20 feet with a rolling walker, transfer with minimum assist.  She is touchdown weightbearing.  Hospital course significant for anemia.  The patient refused to receive transfusions secondary to being a [employer].  The patient is scheduled to have her knee replacement done within about five weeks of completing antibiotics.  The patient was transferred to inpatient rehab department on November 25, 2001.  PAST MEDICAL HISTORY:  Significant for seasonal allergies, ruptured patella.  PAST SURGICAL HISTORY:  Significant for right total knee arthroplasty March 2001, left total knee arthroplasty Sep 21, 2001, appendectomy, and cholecystectomy.  MEDICATIONS:  Oxycodone, Elavil, and iron.  ALLERGIES:  PENICILLIN, CODEINE, VALIUM, ASPIRIN, OGEN.  PRIMARY CARE Jody Porter:  Jody Porter, M.D.  FAMILY HISTORY:  Noncontributory.  SOCIAL HISTORY:  The patient is a widow and lives in a one-level home with one step to entry.  Independent prior to admission.  She denies any tobacco  or alcohol abuse.  HOSPITAL COURSE:  [name] was admitted to the [facility] rehab department on November 25, 2001, for comprehensive inpatient rehabilitation, where she received more than three hours of PT, OT therapy daily.  Overall [name] made great progress while in rehab.  She remained on IV vancomycin, which has been monitored by pharmacy.  The patient received oxycodone as needed for pain.  Hospital course significant for severe anemia.  Admission hemoglobin was 7.6, actually elevated from previous hemoglobin.  She remained on iron 325 mg p.o. q.d. and refused transfusions.  Repeat hemoglobin was performed on July 21, which was up to 8.1.  The patient remained on Coumadin for DVT prophylaxis.  She had Dopplers performed on November 26, 2001, which demonstrated no evidence of DVT, superficial thrombus, or Bakers cyst.  The patient was placed on Protonix 40 mg p.o. q.d. for GI prophylaxis while on Coumadin.  The patient also had experienced occasional nausea.  She received Phenergan 12.5-25 mg p.o. q.6h. p.r.n. as needed.  The nausea eventually subsided.  There were no other medical issues that occurred while the patient was in rehab.  Latest labs indicated the latest INR performed on December 02, 2001, was 1.9. Latest hemoglobin 8.1, hematocrit 25.0, white blood cell count 5.5, platelet count 391.  Latest potassium 4.1.  Sodium 138, glucose 107, BUN 10, creatinine 0.6, She had vancomycin trough performed on November 30, 2001, which was 15.2. AST 13, ALT 12.  She had a urine culture performed on November 25, 2001, which was greater than 100,000 _____ species,  probably a contaminant.  At time of discharge, sterile dry dressings demonstrate no signs of drainage, staples were removed, and Steri-Strips were applied.  The patient was approximately ambulating with modified assist 50-25 feet, performed most ADLs with supervision to modified assist.  The patient was transferred to  skilled nursing facility.  DISCHARGE MEDICATIONS: 1. Ferrous sulfate 325 mg p.o. t.i.d. 2. Senokot S two tablets p.o. q.h.s. 3. Multivitamin p.o. q.d. 4. Protonix 40 mg p.o. q.d. 5. Elavil 50 mg p.o. q.h.s. 6. Coumadin _____ mg p.o. q.d. 7. Vancomycin 150 mg q.24h. IV at 250 ml/hr., to complete a total of 42 days.    She is presently at day 16 out of 42. 6. Phenergan 12.5-25 mg p.o. q.6h. p.r.n. 7. Tylenol 325-650 mg p.o. q.4-6h. p.r.n. 8. Robaxin 500 mg p.o. q.6h. p.r.n. 9. Oxycodone 5-10 mg p.o. q.4-6h. p.r.n.  DISCHARGE INSTRUCTIONS:  Activity-wise, she is to use her walker.  Actually, no bending in her right knee.  She is touchdown weightbearing, to wear a knee immobilizer at all times.  FOLLOW-UP:  The patient is to follow up with Dr. Lequita Porter within two weeks, follow up with primary care Jody Porter within four to six weeks.  Follow up with Dr. Ellwood Porter as needed. Dictated by:   Jody Porter, P.A. Attending Physician:  Jody Porter DD:  12/03/01 TD:  12/03/01 Job: [id number]

## 2010-09-28 NOTE — H&P (Signed)
NAME:  Jody Porter, Jody Porter                     ACCOUNT NO.:  [id number]   MEDICAL RECORD NO.:  [id number]                   PATIENT TYPE:  INP   LOCATION:  NA                                   FACILITY:  [facility]   PHYSICIAN:  Gus Rankin. Aluisio, M.D.              DATE OF BIRTH:  1936-01-14   DATE OF ADMISSION:  01/01/2002  DATE OF DISCHARGE:                                HISTORY & PHYSICAL   CHIEF COMPLAINT:  Prior infected right knee.   HISTORY OF PRESENT ILLNESS:  The patient is a 75 year old female who has  recently undergone resection of right total knee arthroplasty approximately  six weeks ago for infection.  She developed subsequent recurrent infections  in the right knee.  Had original total knee replacement placed and did quite  well until she developed a patellar tendon rupture.  Following the repair of  the rupture she developed subsequent infections which were washed out and  irrigated, however, continued to reoccur.  She developed a draining sinus  tract which did require resection fo the entire prosthesis.  She has been  undergoing IV antibiotics, vancomycin, for six weeks.  Her labs have been  followed on an outpatient basis.  Her hemoglobin has come back up and her  sed rate is back down to normal limits.  It is felt that she has reached a  point where she is ready for stage two of the two stage surgery.  The risks  and benefits were discussed and she was subsequently admitted to the  hospital for stage two.   ALLERGIES:  PENICILLIN causes a rash.  ZOVIRAX causes a rash.  VALIUM causes  confusion.  ASPIRIN causes palpitations.  OGEN and PROGESTERONE cause  nausea.   CURRENT MEDICATIONS:  Ferrous sulfate 325 mg p.o. t.i.d., Senokot S two tabs  daily at night, multivitamin daily, Protonix 40 mg q.d., Elavil 50 mg q.h.s.  She is on Coumadin.  She has been utilizing IV vancomycin 1500 mg every 24  hours via PICC line.  Vicodin 1 every 6 hours as needed for pain.  She  is  recommended to stop the Coumadin prior to surgery.   PAST MEDICAL HISTORY:  Anxiety.  History of gallbladder disease.  Post  menopausal.  Seasonal allergies.  History of postoperative blood loss  anemia.  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   PAST SURGICAL HISTORY:  Right total knee replacement arthroplasty in 2001.  Repair of patellar tendon right knee with subsequent debridement of right  knee.  Cholecystectomy.  Appendectomy.  C-section x 4.  Most recently she  had resection of the right total knee arthroplasty with placement of  antibiotic impregnanted spacer.   SOCIAL HISTORY:  She is a widow, lives alone.  Denies the use of tobacco  products or alcohol products.  Please note that she is Jehovah's Witness and  does not accept blood products.  FAMILY HISTORY:  Significant for cerebral hemorrhage and hypoglycemia in her  father who is deceased at age 84.  Mother with history of skin and  colorectal cancer.   REVIEW OF SYMPTOMS:  GENERAL:  No fevers, chills or night sweats.  NEURO:  No seizure, syncope or paralysis.  RESPIRATORY:  No shortness of breath,  productive cough or hemoptysis.  CARDIOVASCULAR:  No chest pain, angina or  orthopnea.  GI:  No nausea, vomiting, diarrhea or constipation.  GU:  No  dysuria, hematuria or discharge.  MUSCULOSKELETAL:  Pertinent for the right  knee as indicated in the history of present illness.   PHYSICAL EXAMINATION:  VITAL SIGNS:  Pulse 74, respirations 12, blood  pressure 120/78.  GENERAL:  The patient is a 75 year old white female, well-nourished, well-  developed, overweight/obese.  She is alert, oriented and cooperative, very  pleasant at time of exam.  HEENT:  Normocephalic.  Few dentition.  Oropharynx is clear.  NECK:  Supple.  EOMs are intact.  CHEST:  Clear to auscultation anterior and posterior chest walls.  HEART:  Regular rate and rhythm.  No murmurs.  S1, S2 noted.  ABDOMEN:  Large, round,  protuberant abdomen.  Bowel sounds are present.  RECTAL, BREAST, GENITALIA:  Not done.  Not pertinent to present illness.  EXTREMITIES:  Right lower extremity previous incision is well healed.  Very  little range of motion with flexion from 10-30.  She does have a cement  spacer in the knee at this time.  Motor function is intact.   IMPRESSION:  1. Infected right total knee with subsequent removal and placement of     antibiotic impregnanted cement spacer.  2. Anxiety.  3. History of gallbladder disease.  4. Post menopausal.  5. History of blood loss anemia.  6. Seasonal allergies.   PLAN:  Patient admitted to [facility] to undergo stage two with  removal of the cement spacer and reimplantation of the right total knee  replacement arthroplasty.  Surgery will be performed by Dr. Trudee Grip.     Alexzandrew L. Julien Girt, P.A.              Gus Rankin Aluisio, M.D.    ALP/MEDQ  D:  12/30/2001  T:  01/01/2002  Job:  [id number]

## 2010-09-28 NOTE — Discharge Summary (Signed)
[place]  Patient:    Jody Porter, Jody Porter            MRN: [id number] Adm. Date:  [id number] Disc. Date: 09/25/99 Attending:  Herold Harms Dictator:   Bynum Bellows. Idacavage, P.A.C. CC:         Maricela Bo, M.D.             Trudee Grip, M.D.                           Discharge Summary  DIAGNOSES: 1. Status post right total knee arthroplasty. 2. Coumadin anticoagulation. 3. Anemia. 4. Anxiety.  HISTORY OF PRESENT ILLNESS:  The patient is a 75 year old female with a history of osteoarthritis in the bilateral knees, right greater than left, which failed conservative treatment.  She elected to undergo right total knee arthroplasty on Sep 18, 1999, by Trudee Grip, M.D.  She was allowed weightbearing as tolerated and placed on Coumadin for DVT prophylaxis.  She was admitted to inpatient rehabilitation on Sep 21, 1999, where she has participated in greater than three hours per day of physical and occupational therapy.  At the time of admission, she was noted to be minimal assist with bed mobility and transfers and able to ambulate 12 feet with a rolling walker and minimal assistance.  HOSPITAL COURSE:  While on the rehabilitation unit, the patient was plagued with muscle spasms which were somewhat alleviated with Robaxin.  Due to the fact that she was a difficult stick, she was converted to Lovenox so that she would not require daily INRs.  Blood work performed on Sep 22, 1999, demonstrated a white blood count of 5.6, a hemoglobin of 10, a hematocrit of 29.9, platelets of 236, a sodium of 137, potassium 3.6, chloride 100, CO2 29, BUN 10, creatinine 0.7, and glucose 149.  The patient has progressed to the point that she is ready for supervisory level, modified independent, and it was felt that she could be discharged to home with aspirin twice a day for DVT prophylaxis.  DISCHARGE MEDICATIONS: 1. Trinsicon three times a day. 2.  Percocet one every four hours as needed for pain. 3. Robaxin 500 mg every six hours as needed for spasm. 4. One enteric-coated aspirin twice a day.  ACTIVITY:  She is instructed to follow knee precautions and use a walker.  She is weightbearing as tolerated.  DIET:  Full and balanced diet.  WOUND CARE:  She is instructed to keep the incision clean and dry and wash with mild soap and water.  FOLLOW-UP:  She will have home health PT.  Follow up with Trudee Grip, M.D., in one week for staple removal.  CONDITION ON DISCHARGE:  Stable. DD:  09/25/99 TD:  09/25/99 Job: [id number]

## 2010-09-28 NOTE — Discharge Summary (Signed)
[facility]  Patient:    Jody Porter, Jody Porter Visit Number: [id number] MRN: [id number]          Service Type: SUR Location: 4W 0484 01 Attending Physician:  Loanne Drilling Dictated by:   Marcie Bal Troncale, P.A.C. Admit Date:  07/17/2001 Discharge Date: 07/20/2001                             Discharge Summary  PRIMARY DIAGNOSIS:  Right knee infection.  SECONDARY DIAGNOSES: 1. History of hypoglycemia. 2. History of seasonal allergies. 3. History of constipation with impaction.  PAST SURGICAL HISTORY:  Irrigation and debridement of right knee by Dr. Lequita Halt with the assistance of Alexzandrew Julien Girt, P.A.-C., on July 17, 2001.  See operative summary for further details.  CONSULTATIONS:  None.  LABORATORY DATA:  Admission H&H were 11.9 and 36.5 on March 5.  On March 8, she was 9.0 and 27.5 for her hemoglobin and hematocrit respectively.  PT, INR, and PTT preoperatively were 14.2, 1.1, and 37 respectively.  Routine chemistry preoperatively shows a slightly elevated calcium at 10.7.  All other parameters were within normal limits with a normal BUN and creatinine of 20 and 0.8 respectively, and a normal sodium and potassium 137 and 3.8 respectively.  Postoperative day #1 on July 18, 2001, she had a slight dip in her sodium to 134 and slight elevation of her glucose to 121.  Urinalysis preoperatively was all within normal limits with negative parameters. Cultures were preliminary at the time of discharge but, up to this point, showed rare WBCs but no organisms and no growth, both aerobic and anaerobic. A chest x-ray on July 15, 2001, showed no active disease.  EKG also on March 5 showed normal sinus rhythm and a normal EKG, and no significant change since the last tracing on August 11, 1999.  CHIEF COMPLAINT:  Right knee pain.  HISTORY OF PRESENT ILLNESS:  Jody Porter is a 75 year old female who underwent a primary right total knee  arthroplasty in May 2001.  She subsequently developed an infection to that right knee and underwent an irrigation and debridement in March 2001.  She had done fairly well until this past fall when she began developing increasing erythema and drainage from the knee.  She had been following up with Dr. Lequita Halt and it is felt that she would benefit from repeat irrigation and debridement as well as wound cultures.  She has elected now to undergo surgical intervention.  HOSPITAL COURSE:  Following the surgical procedure, patient was taken to the PACU in stable condition, transferred to the orthopedic floor in good condition.  She received routine postoperative pain medications.  She was on vancomycin IV initially for empiric treatment of her knee infection.  Wound cultures were followed closely and, at the time of discharge, they were still preliminary, but they were showing no growth, no organisms, and just rare WBCs.  Patients wound was examined on postoperative day #2 and #3 and found to be clean, dry, and intact without any erythema, swelling, or signs or symptoms of infection.  Patient did well with therapy ambulating without difficulty.  She reported significant improvement in her knee pain.  By postoperative day #3, Dr. Lequita Halt evaluated the patient and noted that the knee was looking great without any erythema and the cultures were remaining negative.  At that point, it was felt she was safe and stable for discharge home on oral antibiotics.  DISPOSITION:  Patient is being discharged home.  Diet is regular as tolerated. Follow up in the [facility] office to see either the nurses or one of the PAs for staple removal next week.  She is then to follow up with Dr. Lequita Halt in two weeks for a recheck of her knee.  She is to call [phone] for those appointments.  Activity:  She may be weightbearing as tolerated on the right lower extremity.  Once daily dressing changes of  the knee.  May shower on postoperative day #5.  DISCHARGE MEDICATIONS: 1. Cipro 500 mg p.o. b.i.d. x 2 weeks. 2. Robaxin 500 mg p.o. q.8h. p.r.n. spasm. 3. Percocet 5/325 one or two every 4 to 6 hours as needed for pain.  CONDITION ON DISCHARGE:  Good and improved. Dictated by:   Marcie Bal Troncale, P.A.C. Attending Physician:  Loanne Drilling DD:  07/20/01 TD:  07/21/01 Job: [id number]

## 2010-09-28 NOTE — H&P (Signed)
[facility]  Patient:    Jody Porter, Jody Porter            MRN: [id number] Adm. Date:  [id number] Disc. Date: [id number] Attending:  Loanne Drilling Dictator:   Alexzandrew L. Perkins, P.A.-C.                         History and Physical  CHIEF COMPLAINT:  Painful right knee.  HISTORY OF PRESENT ILLNESS:  The patient is a 75 year old female who is well known to Dr. Homero Fellers Aluisio.  She has previously undergone a right total knee replacement arthroplasty and then a subsequent patella tendon repair following a tendon rupture, which was sustained during a fall.  She has been followed for an open wound.  She is seen and found to have a draining sinus tract in and about the anterior right knee secondary to infection.  Due to the significant findings, it is felt she would best be served to be admitted to the hospital to undergo irrigation and debridement and IV antibiotic.  The patient is subsequently admitted to [facility].  ALLERGIES:  PENICILLIN causes hives.  ZOVIRAX causes rash.  INTOLERANCE:  ASPIRIN causes GI upset.  OGEN, PROGESTERONE nausea and vomiting.  VOLTAREN causes liver problems.  PAST MEDICAL HISTORY: 1. Seasonal allergies. 2. Hypoglycemia. 3. History of constipation with impaction.  PAST SURGICAL HISTORY: 1. Right total knee replacement arthroplasty in May 2001 with subsequent    patella tendon rupture following fall. 2. Gallbladder surgery. 3. Appendectomy. 4. C-section x 4.  SOCIAL HISTORY:  The patient denies tobacco products or alcohol products.  The patient is a Jehovah Witness, no blood products.  FAMILY HISTORY:  Noncontributory.  REVIEW OF SYSTEMS:  GENERAL:  No fevers, chills, or night sweats.  NEUROLOGIC: No seizures, syncope, paralysis.  RESPIRATORY:  No shortness of breath, productive cough, or hemoptysis.  The patient does have seasonal allergies. CARDIOVASCULAR:  No chest pain, angina, or orthopnea.  GI:   The patient does have a history of a colonic impaction, also constipation.  No blood or mucus in the stool.  No diarrhea.  GU:  No dysuria, hematuria, or discharge. MUSCULOSKELETAL:  Pertinent for the right knee found in the history of present illness.  PHYSICAL EXAMINATION:  VITAL SIGNS:  Temperature 96.8, pulse 74, respirations 18, blood pressure 139/76.  GENERAL:  The patient is a 75 year old female, short in stature, overweight. She is alert, oriented, and cooperative, somewhat mildly anxious at the time of exam.  She appears to be her stated age.  HEENT:  Normocephalic, atraumatic.  Pupils are round and reactive.  Oropharynx is clear.  NECK:  Supple.  No carotid bruits are appreciated.  CHEST:  Clear to auscultation and percussion.  No rhonchi or rales are appreciated.  HEART:  Regular rhythm.  S1, S2 noted.  No rubs, thrills, palpitations, or murmur appreciated on exam.  ABDOMEN:  She has a large, round, protuberant abdomen.  Bowel sounds are present.  She does not have any rebound or guarding on exam.  RECTAL/BREASTS/GENITALIA:  Not done.  Not pertinent to present illness.  EXTREMITIES:  Significant for the right lower extremity.  Previous right surgical anterior knee incision, as seen on exam.  The patient has a small, draining sinus tract over the joint line.  There is some surrounding erythema. Tissues are somewhat erythematous and indurated.  Distal pulses are noted to be intact.  Sensation is intact throughout the right lower extremity.  IMPRESSION: 1. Superficial versus deep infection, right knee. 2. Status post right total knee replacement arthroplasty. 3. Status post repair of patella tendon rupture.  PLAN:  The patient is subsequently admitted to [facility] for bed rest, IV antibiotics, and also to undergo surgical I&D, as needed. DD:  04/24/00 TD:  04/24/00 Job: [id number]

## 2010-09-28 NOTE — H&P (Signed)
NAMESERAH, NICOLETTI           ACCOUNT NO.:  [id number]   MEDICAL RECORD NO.:  [id number]          PATIENT TYPE:  EMS   LOCATION:  MAJO                         FACILITY:  [facility]   PHYSICIAN:  Hettie Holstein, D.O.    DATE OF BIRTH:  01-12-1936   DATE OF ADMISSION:  11/19/2005  DATE OF DISCHARGE:                                HISTORY & PHYSICAL   PRIMARY CARE PHYSICIAN:  Lianne Bushy, M.D.; unassigned to our service.   NEUROSURGEON:  Tia Alert, M.D.   CHIEF COMPLAINT:  Intractable left low back, buttock and thigh pain, and  inability to walk.   HISTORY OF PRESENTING ILLNESS:  Mrs. Lazarus is a 75 year old  independently-living, obese female with a history of degenerative disk  disease status post laminectomy, prostatectomy, foraminotomy, for central  cord and nerve root decompression by Dr. Yetta Barre in October of 2004.  She had  been doing fairly well, with the exception of the past several months when  she developed progressively worsening pain, to the point where she was  unable to walk at home today.  She had been seen by her primary care  physician, and had been following up with Dr. Yetta Barre, who ordered an MRI a  couple of weeks ago.  This was performed at [facility].  Therefore, I did  not have access to these images at this point.  I have contacted Dr. Purnell Shoemaker,  who was in the operating room, and I discussed this case with him.  He was  informed by Dr. Yetta Barre that there was nothing surgical on this particular  MRI, and that she did not need anything from them except to be admitted for  pain control.  In any event, will request these imaging studies just for our  records.  In any event, Mrs. Platte continues to be in quite  considerable pain.  She was on Dilaudid p.o. at home by Dr. Yetta Barre, and this  has not been successful.   She does have multiple drug allergies, as listed below.  In any event, she  was evaluated by the ED staff, and she was felt to have otherwise  no other  complaints, with the exception of the spasm pain as described above.  She  was examined in the department and exhibited no evidence of neurologic  deficits, though the exam was quite limited due to her intense reactions to  pain.  She is being admitted for further pain control.   PAST MEDICAL HISTORY:  As described above.  1.  Lumbar degenerative disk disease.  2.  Status post laminectomy.  3.  Fasciotomy.  4.  Foraminotomy for central canal, as well as nerve root decompression in      October of 2004.  5.  Apparently, she had some sciatica that has resolved and improved.  6.  History of anxiety.  7.  Status post cesarean section x4.  8.  Status post cholecystectomy and appendectomy.  9.  History of right knee surgery x6; left x1.   MEDICATIONS:  1.  Dilaudid 4 mg q.4h.  This was recently initiated.  She had been able to  tolerate both morphine and Dilaudid in the past.  2.  Amitriptyline 20 mg q.a.m.  3.  Protonix 40 mg daily.  4.  Benadryl 25 mg p.r.n. at home.   ALLERGIES:  1.  She reports a rash with PENICILLIN.  2.  She reports a rash with ZOVIRAX.  She reports intolerances to:  1.  Though she does state VOLTAREN, she did have elevations in her liver      enzymes.  2.  Intolerance to CODEINE where she develops some confusion.  3.  Intolerance to HYDROCODONE where she develops some confusion.  4.  ASPIRIN, she states, increased her heart rate.  5.  She reports intolerance to ESTROGEN.  6.  She develops GI upset with CIPRO.  7.  VALIUM causes prolonged confusion.   SOCIAL HISTORY:  The patient lives alone.  She is on disability.  She is  widowed for 6 years.  She has 4 children.  Her daughter can be reached at  [phone].  She denies tobacco or alcohol.   FAMILY HISTORY:  Mother is alive at 94.  Father died at age 70 with a  cerebral bleed.   REVIEW OF SYSTEMS:  The patient denies any loss of bladder or bowel control.  There has been no nausea, vomiting,  chest pain, shortness of breath.  She  has had exquisite pain.  She has been unable to walk.  No swelling of her  lower extremities.  No bloody emesis or bloody stools.  She denies  incontinence.  Further review of systems were unremarkable.   LABORATORY DATA:  None were obtained in the emergency department.  We are  requesting a urinalysis.   ASSESSMENT:  1.  Intractable low back pain.  2.  Lumbar degenerative disk disease.  3.  Morbid obesity.  4.  Anxiety.  5.  Multiple drug allergies and intolerances.   PLAN AT THIS TIME:  Mrs. Chiang is being admitted for further pain  management and control.  I have discussed this case with Dr. Purnell Shoemaker in the  operating room this evening, who is covering from Dr. Yetta Barre.  In addition,  will request [facility] MRI studies.  Mrs. Haller had been ordered  nerve conduction studies in the  outpatient setting; however, on discussion with Dr. Purnell Shoemaker, it was conveyed  to him that there were no acute surgical issues, and that her presentation  was predominantly for pain management.  In any event, we will follow her  course.  She will be placed on a PCA, as well as muscle relaxants.      Hettie Holstein, D.O.  Electronically Signed     ESS/MEDQ  D:  11/19/2005  T:  11/20/2005  Job:  [id number]   cc:   Lianne Bushy, M.D.  Fax: [phone]   Tia Alert, MD  Fax: [phone]

## 2010-09-28 NOTE — Discharge Summary (Signed)
[place]. [facility]  Patient:    Jody Porter, Jody Porter Visit Number: [id number] MRN: [id number]          Service Type: [facility] Location: 4100 [id number] [id number] Attending Physician:  Faith Rogue T Dictated by:   Dian Situ, P.A. Admit Date:  09/23/2001 Discharge Date: 10/02/2001   CC:         Maricela Bo, M.D.  [name], M.D.   Discharge Summary  DISCHARGE DIAGNOSES: 1. Status post left total knee replacement. 2. Postoperative anemia.  HISTORY OF PRESENT ILLNESS:  Jody Porter is a 75 year old female with history of right total knee replacement with OA of left knee who failed conservative therapy with increased pain and elected to undergo left total knee replacement on May 12, by Dr. Lequita Halt.  Postoperatively, she was noted to have decrease in her H&H, but has refused any transfusions as a Jehovah Witness.  She has had problems with nausea and pain control issues. Currently, she is weightbearing as tolerated and on Coumadin for DVT prophylaxis.  Physical therapy was initiated.  The patient was minimal assist for transfers and minimal assist with ambulating 12 feet with rolling walker, knee flexion at 40 degrees.  PAST MEDICAL HISTORY: 1. Seasonal allergies. 2. Constipation. 3. Obstipation with narcotics. 4. Right total knee replacement. 5. I&D in February 2003. 6. Cholecystectomy. 7. Appendectomy. 8. C-section x4. 9. Anxiety disorder.  ALLERGIES:  PENICILLIN, OGEN, PROGESTERONE, CODEINE, VALIUM, ASPIRIN, VOLTAREN, HYDROCODONE.  SOCIAL HISTORY:  The patient is widowed and lives in one-level home with one step at entry and was independent prior to admission.  She does not use any tobacco or alcohol.  HOSPITAL COURSE:  Jody Porter was admitted to rehabilitation on Sep 23, 2001, for inpatient therapies to consist of PT/OT daily.  Past admission, she has been maintained on Coumadin for DVT prophylaxis.  She was  started on OxyContin CR to help with pain control issue.  She has had complaints of nausea questioned secondary to Trinsicon or laxatives and medications were adjusted.  She was noted to have a moderate amount of edema and ecchymosis at the surgical site which is much improved by the time of discharge.  The patients knee incision is noted to be healing well without any signs or symptoms of infection, drainage or erythema noted.  Labs checked past admission showed hemoglobin 9.4, hematocrit 28.7, white count 5.8 and platelets 269.  Check of UA showed greater than 100,000 colonies of enterococcus species as well as hafnia alvei that was pansensitive.  She was initially started on amoxicillin, however, has had problems with nausea. She was changed over to Cipro and is to complete seven total days of antibiotic therapy.  During her stay in rehabilitation, Jody Porter has made good progress.  She has participated in therapies and has progressed currently to being modified independent for transfers, modified independent for ambulating 100-120 feet. She is able to navigate a ramp with independent levels requiring some supervision for car transfers.  She is modified independent for ADLs including toileting.  She will need supervision for kitchen activities.  The patient will continue to receive home health PT/OT by [facility] passed discharge.  Home health has been arranged to draw PT and [facility] to follow Coumadin for three additional weeks past discharge.  On Oct 02, 2001, the patient is discharged to home.  DISCHARGE MEDICATIONS: 1. Cipro 250 mg p.o. b.i.d. 2. OxyContin CR 20 mg p.o. b.i.d. x1 week, then one pill per day  x1 week, then    discontinue. 3. Senokot S two p.o. q.h.s. 4. Vioxx 25 mg per day. 5. Coumadin 6 mg per day. 6. Skelaxin 400 mg p.o. q.i.d. p.r.n.  DIET:  Regular.  WOUND CARE:  Keep area clean and dry.  SPECIAL INSTRUCTIONS:  No alcohol, no  smoking and no driving.  No aspirin products or NSAIDs while on Coumadin.  FOLLOWUP:  The patient is to follow up with Dr. Lequita Halt in one week.  Follow up with Dr. Riley Kill as needed. Dictated by:   Dian Situ, P.A. Attending Physician:  Faith Rogue T DD:  10/01/01 TD:  10/05/01 Job: [id number]

## 2010-09-28 NOTE — H&P (Signed)
[facility]  Patient:    Jody Porter, Jody Porter            MRN: [id number] Adm. Date:  [id number] Disc. Date: [id number] Attending:  Devoria Albe Dictator:   Alexzandrew L. Perkins, P.A.-C.                         History and Physical  CHIEF COMPLAINT:  Right knee pain.  HISTORY OF PRESENT ILLNESS:  The patient is a 75 year old female patient of Dr. Homero Fellers Aluisios whose previously undergone a right total knee replacement arthroplasty 3 weeks prior to her admission. She has been doing quite well in her postoperative period up until a point where she lost her balance at home, fell onto her right knee. She was seen for Dr. Lequita Halt due to the right knee pain and was found to have ruptured her patella tendon. It was felt that she would require surgical intervention. The patient was set up for surgery and subsequently admitted to [facility].  ALLERGIES:  No known drug allergies.  CURRENT MEDICATIONS:  Percocet p.r.n. pain, iron supplement, muscle relaxant, Coumadin daily, and aspirin.  PAST MEDICAL HISTORY:  Borderline hypertension, reflux, osteoarthritis.  PAST SURGICAL HISTORY:  Right total knee replacement arthroplasty, cholecystectomy, appendectomy, cesarean section x 4.  SOCIAL HISTORY:  The patient denies the use of alcohol products of tobacco products.  FAMILY HISTORY:  Significant for cancer and arthritis.  REVIEW OF SYSTEMS:  GENERAL:  No fevers, chills, night sweats. NEURO: No seizures, syncope or paralysis. RESPIRATORY;  The patient only has some shortness of breath with exertion; however, no shortness of breath at rest, no productive cough or hemoptysis. CARDIOVASCULAR: No chest pain, angina or orthopnea. GI:  No nausea, vomiting, diarrhea or constipation. She does have some occasional reflux. GU:  No dysuria, hematuria, discharge. MUSCULOSKELETAL:  Pertinent to that of the right knee found in the history of present  illness.  PHYSICAL EXAMINATION:  VITAL SIGNS:  Temperature 97.3, pulse 91, respirations 20, blood pressure 132/88.  GENERAL:  The patient is a 75 year old female, well-nourished, well-developed who appears to be in no acute distress.  She is alert, oriented and cooperative at the time of exam.  HEENT:  Normocephalic, atraumatic. Pupils are round and reactive. Oropharynx is clear.  NECK:  Supple.  CHEST:  Clear to auscultation and percussion. No rhonchi or rales.  HEART:  Regular rate and rhythm, no murmurs are appreciated.  ABDOMEN:  Soft abdomen, bowel sounds present. No rebound or guarding.  RECTAL/BREASTS/GENITALIA:  Not done not pertinent to present illness.  EXTREMITIES:  Right lower extremity prior healing right total knee anterior scar. The patient does have a moderate amount of swelling and much adiposity with the leg.  NEUROLOGIC:  Motor function is grossly intact. Sensation is intact.  IMPRESSION: 1. Ruptured patella tendon, right knee. 2. Status post right total knee replacement arthroplasty. 3. Obesity. 4. Diffuse osteoarthritis. 5. Jehovah Witness.  PLAN:  The patient will be admitted to [facility] to undergo repair of the ruptured right patella tendon.DD:  10/24/99 TD:  10/24/99 Job: [id number]

## 2010-09-28 NOTE — Discharge Summary (Signed)
[facility]  Patient:    Jody Porter, Jody Porter            MRN: [id number] Adm. Date:  [id number] Disc. Date: 09/21/99 Attending:  Loanne Drilling Dictator:   Atlee Abide, P.A.                           Discharge Summary  OPERATIONS:  On Sep 18, 1999 the patient underwent right total knee replacement arthroplasty, Avel Peace, P.A.-C. assisted.  ADMITTING DIAGNOSES: 1. Osteoarthritis right knee. 2. Chronic anxiety.  DISCHARGE DIAGNOSES: 1. Osteoarthritis right knee. 2. Chronic anxiety. 3. Mild postoperative anemia.  BRIEF HISTORY:  This 75 year old white female with progressive problems concerning both of her knees more so on the right than the left. She has developed some severe osteoarthritis with a varus deformity. She has collapse of the joint space and bone on bone deformity. Due to these positive findings and particularly in light of her relatively young age, it was felt that she would benefit with surgical intervention and is being admitted for the above procedure.  HOSPITAL COURSE:  The patient tolerated the surgical procedure quite well. She was an active participant in early physical therapy for the total knee protocol. She was  up to 55 degrees on CPM machine. Dr. Thomasena Edis saw the patient and thought that she would be an excellent candidate for an inpatient rehab program and the arrangements were made for her transfer. On the day of discharge, the wound was dry, vital signs were stable, she was afebrile and had no complaints. She had been ambulating in the room with her walker.  Laboratory values in the hospital hematologically showed a CBC with differential which was within normal limits but chemistries were essentially normal. Urinalysis negative for urinary tract infection.  Chest x-ray showed "no active lung disease".  Electrocardiogram with normal sinus rhythm.  CONDITION ON DISCHARGE:  Improved and stable.  PLAN:   The patient is transferred to [facility] for an intensive inpatient rehabilitation program. Recommend she continue on her Trinsicon 1 t.i.d. Home medications. We have been using the Percocet for discomfort and Robaxin as a muscle relaxant. She also uses Restoril from time to time for sleep. DD:  09/21/99 TD:  09/21/99 Job: [id number]

## 2010-09-28 NOTE — Op Note (Signed)
[facility]  Patient:    Jody Porter, Jody Porter Visit Number: [id number] MRN: [id number]          Service Type: SUR Location: 4W [id number] 01 Attending Physician:  Loanne Drilling Dictated by:   [name], M.D. Proc. Date: 11/16/01 Admit Date:  11/16/2001                             Operative Report  PREOPERATIVE DIAGNOSIS:  Infected right total knee arthroplasty.  POSTOPERATIVE DIAGNOSIS:  Infected right total knee arthroplasty.  OPERATION:  Resection arthroplasty right knee with placement of antibiotic-impregnated spacer.  SURGEON:  Trudee Grip, M.D.  ASSISTANT:  Alexzandrew L. Perkins, P.A.-C.  ANESTHESIA:  General.  ESTIMATED BLOOD LOSS:  400 cc.  DRAIN:  Hemovac x 1  COMPLICATIONS:  None.  CONDITION:  Stable, to recovery.  BRIEF CLINICAL NOTE: [name] is a 75 year old female who had right total knee arthroplasty done approximately two years ago.  Postoperatively, she had a fall causing a patella tendon rupture.  She had repair and then subsequently developed infection.  She has had debridement x 2 with eventual eradication but then subsequently reinfection.  She had a sinus tract, and at this point decided to do resection arthroplasty.  PROCEDURE IN DETAIL:  After successful administration of general anesthetic, a tourniquet was placed high on her right thigh and right lower extremity prepped and draped in usual sterile fashion.  Extremity was elevated.  The tourniquet inflated to 400 mmHg.  Her previous incision was utilized and sinus tract excised.  The incision was taken down to the extensor mechanism.  The joint was entered and there was purulent fluid which was sent for STAT Gram stain C&S.  There was a second pocket of purulent fluid found underneath the patella tendon.  This was down in the tubercle area.  The bone does not appear to be involved.  This fluid was also sent.  We then cleared out a large amount of scar tissue.   The tissue had a healthier appearance once the abnormal tissue was resected.  The femoral component was then removed with minimal bone loss.  We used osteotomes to disrupt the interface between cement and bone. There was some loosening of this prosthesis; thus it was easily removed.  The tibial tray was then removed, again easily.  There was some cavitary defect but no segmental defect.   The old cement was removed and the canal then identified and lavaged.  The abnormal-appearing soft tissue was completely excised to give more normal-appearing tissue.  Patella components were then removed.  The soft tissue debridement and bony debridement were then completed.  Two bags of Palacos cement with 1 g vancomycin and 1.2 g tobramycin were then mixed, and antibiotic spacer is configured.  Once the cement was fully hardened, the wound was copiously irrigated again, and the extensor mechanism closed over a Hemovac drain with interrupted #1 PDS.  The tourniquet is then released with a total time of 82 minutes.  Subcutaneous tissue is closed with interrupted 2-0 Vicryl and skin with staples.  Bulky sterile dressing applied.  The drain is hooked to suction.  The patient is placed into a knee immobilizer, awakened, and transferred to recovery in stable condition.Dictated by:   [name], M.D. Attending Physician:  Loanne Drilling DD:  11/16/01 TD:  11/18/01 Job: [id number]

## 2010-09-28 NOTE — Discharge Summary (Signed)
NAME:  Jody Porter, Jody Porter                     ACCOUNT NO.:  [id number]   MEDICAL RECORD NO.:  [id number]                   PATIENT TYPE:  INP   LOCATION:  5025                                 FACILITY:  [facility]   PHYSICIAN:  Alexzandrew L. Julien Girt, P.A.        DATE OF BIRTH:  January 24, 1936   DATE OF ADMISSION:  01/01/2002  DATE OF DISCHARGE:  01/07/2002                                 DISCHARGE SUMMARY   ADMISSION DIAGNOSES:  1. Infected right total knee with subsequent removal and placement of     antibiotic-impregnated spacer.  2. Anxiety.  3. History of gallbladder disease.  4. Postmenopausal.  5. History of blood loss anemia.  6. Seasonal allergies.   DISCHARGE DIAGNOSES:  1. Infected right total knee post resection, status post right total knee     reimplantation.  2. Postoperative blood loss anemia.  3. Anxiety.  4. History of gallbladder disease.  5. Postmenopausal.  6. History of blood loss anemia.  7. Seasonal allergies.   PROCEDURE:  The patient was taken to the operating room on 01/01/02,  underwent a reimplantation of the right total knee arthroplasty after  removal of antibiotic-impregnated spacer, surgeon Gus Rankin. Aluisio, M.D.,  assistant Alexzandrew L. Perkins, P.A.-C.  Surgery under general anesthesia.  Hemovac drain x1.  Tourniquet time 1 hour 40 minutes at 400 mmHg.   CONSULTATIONS:  None.   HISTORY OF PRESENT ILLNESS:  The patient is a 75 year old female who is well-  known to Dr. [name] and had previously undergone resection of a total  knee arthroplasty approximately six weeks ago for infection.  She has been  doing quite well and received six weeks of IV antibiotics.  Her outpatient  labs were negative, hemoglobin was back up, and her sedimentation rate was  back down to normal limits.  She is admitted for surgical intervention.   LABORATORY DATA:  CBC on admission:  Hemoglobin of 12.9, hematocrit 39.9,  white cell count 6.4, red cell count  5.07.  Postop H&H 9.6, hemoglobin  continued to drop down to 8.1 and hematocrit of 24.9.  Last noted CBC showed  hemoglobin back up to 9.7, hematocrit of 30.4, normal white count of 6.2.  On the admission CBC, the differential was all within normal limits.  A  follow-up CBC on 01/06/02, differential all within normal limits.  PT, PTT on  admission were 14.8 and 31, respectively, with an INR of 1.2.  Serial  proTimes followed per routine Coumadin protocol.  Last noted PT/INR was PT  of 19.3 with an INR of 1.8.  Chem panel on admission all within normal  limits with the exception of elevated ALP of 145.  Follow-up BMET on 01/02/02  showed an increase of glucose from 109 up to 152.  Follow-up BMET still  showed glucose elevated at 153.  BUN decreased from 13 down to 5.  Electrolytes remained within normal limits.  Blood type O positive.  Body  fluid culture  taken at the time of OR on 01/01/02 showed a Gram stain of no  organisms and no growth.  Stat Gram stain taken within the OR itself showed  no organisms, rare wbc's, rare rbc's.  Anaerobic culture Gram stain showed  no organisms and no anaerobes.   X-rays taken of the right knee postop on 01/01/02 show new right total knee  replacement arthroplasty in good position and alignment.  Chest x-ray report  dated 12/03/01:  No active cardiopulmonary process demonstrated.  EKG dated  07/15/01:  Normal sinus rhythm, normal EKG, no significant changes from the  last tracing of 08/11/99, confirmed by Francisca December, M.D.   HOSPITAL COURSE:  The patient was admitted to [facility], taken to  the OR, underwent the above-stated procedure without complications.  The  patient tolerated the procedure well, later was returned to the recovery  room and then to the floor to continue postop care.  Vital signs were  followed.  The patient did have a spike in temperature on postop day 5.  She  underwent a workup.  Her temperature did come back down with  incentive  spirometer and antipyretics, and she remained afebrile.  Dressing changes  were initiated on postop day 2.  The incision was healing well.  The Hemovac  drain placed at time of surgery was pulled on postop day 1 without  difficulty.  Physical therapy, occupational therapy was consulted to assist  with gait training, ambulation.  She was slow to progress with physical  therapy, but she was ambulating approximately 20 feet by postop day 3.  She  did increase that to approximately 100 feet by postop day 5.  Initially had  difficulty with pain control following surgery.  She was placed on PCA  analgesics and p.o. analgesics.  She was weaned off her PCA over to p.o.  analgesics throughout the hospital course.  It was noted from her last  procedure she did go out to [facility] for post-hospitalization care.  The  patient does live alone.  Discharge planning was consulted to assist with  disposition of the patient.  On 01/07/02 the patient was seen in rounds by  Dr. Lequita Halt.  She was doing quite well.  Her hemoglobin was back up, she was  afebrile, and the incision was healing well with no signs of infection and  no drainage.  It was felt she was ready for discharge.  Arrangements were  being made for transfer back to [facility].   DISCHARGE PLAN:  The patient is transferred back to [facility] on 01/07/02.   DISCHARGE DIAGNOSES:  Please see above.   DISCHARGE MEDICATIONS:  1. Colace 100 mg p.o. b.i.d.  2. Trinsicon one p.o. t.i.d.  3. Coumadin as per pharmacy protocol.  4. Multivitamin p.o. q.d.  5. Elavil 50 mg p.o. q.h.s.  6. Protonix 40 mg q.d.  7. Vicodin 5 mg, this is reduced down from Vicodin ES in the hospital.  Now     she will need to be in Vicodin 5 mg one or two every four to six hours as     needed for pain.  8. Dulcolax 10 mg p.o. q.d. p.r.n.  9. Laxative of choice.  10.      Enema of choice.  11.      Reglan 10 mg p.o. q.6h. p.r.n. nausea. 12.      Tylenol one or two  every four to six hours as needed for mild pain     except  for headache.  13.      Robaxin 500 mg p.o. q.6-8h. p.r.n. spasm.  14.      Restoril 15-30 mg p.o. q.h.s. p.r.n. pain.   DISCHARGE INSTRUCTIONS:  Diet:  Diet as tolerated.  Activity:  She may be  full weightbearing to the right lower extremity.  Continue gait training and  ambulation as per physical therapy and occupational therapy.  Continue wound  observation.  Staples may be discontinued 14 days postoperatively while at  [facility].   FOLLOW-UP:  Patient to follow up with Dr. Lequita Halt approximately two to three  weeks following the date of surgery.  The date of surgery was 01/01/02.  Please contact the office at [phone] to arrange appointment time and  transfer for the patient.   DISPOSITION:  [facility].   CONDITION ON DISCHARGE:  Improved.                                                Alexzandrew L. Julien Girt, P.A.    ALP/MEDQ  D:  01/07/2002  T:  01/07/2002  Job:  [id number]   cc:   [facility]

## 2010-09-28 NOTE — Discharge Summary (Signed)
NAMEADIN, LARICCIA           ACCOUNT NO.:  [id number]   MEDICAL RECORD NO.:  [id number]          PATIENT TYPE:  INP   LOCATION:  6739                         FACILITY:  [facility]   PHYSICIAN:  Michaelyn Barter, M.D. DATE OF BIRTH:  [birth year]   DATE OF ADMISSION:  11/19/2005  DATE OF DISCHARGE:  11/24/2005                                 DISCHARGE SUMMARY   PRIMARY CARE PHYSICIAN:  Dr. Kathrynn Running; therefore, she is unassigned.   FINAL DIAGNOSIS:  Intractable back pain   CONSULTATIONS:  Neurosurgery.   PROCEDURES:  No procedures.   HISTORY OF PRESENT ILLNESS:  Ms. Ferriss is a 75 year old female who  stated that she had been doing fairly well over the past several months.  She developed progressively worse back pain.  It became so progressive that  she was unable to walk at home today.  Both her primary care physician and  Dr. Yetta Barre, the neurosurgeon, had been following the patient.  Dr. Yetta Barre  ordered an MRI a few weeks prior to this admission.  At the time of  admission, it was stated that Dr. Yetta Barre had indicated in the past that there  was nothing surgical seen on the MRI and that the patient simply needed to  be admitted for pain control.  For past medical history, please see note  dictated by Dr. Hannah Beat on November 19, 2005.   HOSPITAL COURSE:  Intractable back pain.  Again, this appeared to be a  chronic ongoing problem with the patient.  She was provided with p.r.n. pain  medications around the clock.  Neurosurgery was consulted.  They saw the  patient on July 11.  They indicated that possible things that may help would  be:  1. Decadron for a short course.  2. LES1 via radiology or even S1 nerve root block.  3. Increased Elavil or add Lyrica/Cymbalta/Neurontin or other similar      medication.  4. Try to get the patient to undergo a CT/myelography of L-spine to make      sure this cannot be helped with further surgery.   By July 11, the patient was still  complaining of back pain, but this  occurred primarily with activity.  When the patient was at rest, she stated  that her back was significantly better.  Physical therapy was consulted.  Their note on July 12 indicated that the patient went from a supine position  to a sitting position with minimal assistance.  The patient walked with a  rolling walker with minimal assistance, and all were done without any pain.  They indicated that the patient could possibly benefit from outpatient  physical therapy for back pain relief.  Each day, the patient indicated that  her back pain had been improving, and by the date of discharge, she  indicated that her back was significantly better.  She had experienced one  episode of pain earlier in the day; however, that have alleviated itself.  Her vitals on the date of discharge:  Her temperature was 97.9, heart rate  86, respirations 20, blood pressure 139/87, O2 sat was 96% on room air.  The  decision was made to discharge the patient home as she requested to be  discharged.   She was discharged home on:  1. Amitriptyline 50 mg p.o. q.d.  2. Colace 100 mg p.o. q.d.  3. Robaxin 1500 mg p.o. q.6 h.  4. Senokot-S 1 tablet q.d.  5. Roxicodone 5 mg p.o. q.4 h. p.r.n.   She was told to follow-up with Dr. Kathrynn Running within 1 week, to take her  medications as prescribed and to avoid any strenuous lifting.      Michaelyn Barter, M.D.  Electronically Signed     OR/MEDQ  D:  01/22/2006  T:  01/22/2006  Job:  [id number]

## 2010-09-28 NOTE — Op Note (Signed)
NAME:  Jody Porter, Jody Porter                     ACCOUNT NO.:  [id number]   MEDICAL RECORD NO.:  [id number]                   PATIENT TYPE:  INP   LOCATION:  3172                                 FACILITY:  [facility]   PHYSICIAN:  Tia Alert, MD                  DATE OF BIRTH:  [birth year]   DATE OF PROCEDURE:  02/16/2003  DATE OF DISCHARGE:                                 OPERATIVE REPORT   PREOPERATIVE DIAGNOSIS:  Lumbar spondylosis with lumbar spinal stenosis, L2-  3, L3-4, and L4-5, with back and leg pain.   POSTOPERATIVE DIAGNOSIS:  Lumbar spondylosis with lumbar spinal stenosis, L2-  3, L3-4, and L4-5, with back and leg pain.   PROCEDURE:  Decompressive lumbar laminectomy, bilateral medial  facetectomies, and bilateral foraminotomies at L2-3, L3-4, and L4-5, for  central canal and nerve root decompression.   SURGEON:  Tia Alert, M.D.   ASSISTANT:  Kathaleen Maser. Pool, M.D.   ANESTHESIA:  General endotracheal.   COMPLICATIONS:  None apparent.   INDICATION FOR PROCEDURE:  [name] is a 75 year old white female who  presented to the neurosurgery clinic with complaints of back pain with  bilateral leg pain, which was continuing to worsen.  She had an MRI, which  showed severe spinal stenosis at L2-3 with moderate stenosis at L3-4 and  significant lateral recess stenosis at L4-5.  I recommended a decompressive  lumbar laminectomy.  She understood the risks, benefits, and alternatives,  and wished to proceed.   DESCRIPTION OF PROCEDURE:  The patient was taken to the operating room, and  after induction of adequate general endotracheal anesthesia she was rolled  into a prone position on the Wilson frame, and all pressure points were  padded.  The lumbar region was prepped with Duraprep and then draped in the  usual sterile fashion.  Local anesthesia 10 mL was injected and then a  dorsal midline incision was made and carried down to the lumbosacral fascia.  The fascia was  then opened and the paraspinous musculature was taken down in  a subperiosteal fashion to expose the L2, L3, and L4 laminae bilaterally.  Intraoperative x-ray confirmed my level, and then I used the Leksell rongeur  to remove the spinous processes of L2, L3, and L4, and then used Kerrison  punches to perform complete laminectomies, medial facetectomies, and  bilateral foraminotomies at L2-3, L3-4, and L4-5.  She had significant  overgrown yellow ligament in the lateral recesses at L4-5 bilaterally, and I  was able to decompress out to the pedicle level of L2, L3, L4, and L5, and  follow each of the nerve roots into their respective foramina.  Once the  decompression was complete bilaterally, I palpated with a curved dissector  to assure that adequate neural decompression.  I inspected the disk space at  L2-3 and found a hard disk herniation at L2-3 to the left, which we decided  to not remove as it did not seem to be causing significant neural  compression and her preoperative symptoms were not consistent with a right  L3 radiculopathy.  Therefore, we irrigated with copious amounts of  bacitracin-containing saline solution, dried all bleeding points with  bipolar cautery and with Surgifoam, and then lined the dura with Gelfoam and  then placed a medium Hemovac drain through a separate stab incision, then  closed the muscle and then the fascia with interrupted #1 Vicryl, closed the  subcutaneous tissue with 2-0 and 3-0 Vicryl,  and closed the skin with Dermabond.  The drapes were removed.  A sterile  dressing was applied.  The patient was awakened from general anesthesia and  transported to the recovery room in stable condition.  At the end of the  procedure all sponge, needle, and instrument counts were correct.                                               Tia Alert, MD    DSJ/MEDQ  D:  02/16/2003  T:  02/16/2003  Job:  [id number]

## 2010-09-28 NOTE — Op Note (Signed)
NAME:  Jody Porter, Jody Porter                     ACCOUNT NO.:  [id number]   MEDICAL RECORD NO.:  [id number]                   PATIENT TYPE:  INP   LOCATION:  5025                                 FACILITY:  [facility]   PHYSICIAN:  Gus Rankin. Aluisio, M.D.              DATE OF BIRTH:  Apr 28, 1936   DATE OF PROCEDURE:  DATE OF DISCHARGE:                                 OPERATIVE REPORT   PREOPERATIVE DIAGNOSES:  Infected right total knee post resection.   POSTOPERATIVE DIAGNOSES:  Infected right total knee post resection.   OPERATION PERFORMED:  Right total knee reimplantation.   SURGEON:  Gus Rankin. Aluisio, M.D.   ASSISTANT:  Alexzandrew L. Julien Girt, P.A.   ANESTHESIA:  General.   ESTIMATED BLOOD LOSS:  Minimal.   DRAINS:  Hemovac times one.   COMPLICATIONS:  None.   TOURNIQUET TIME:  One hour and 40 minutes at 400 mmHg.   DISPOSITION:  Condition stable to recovery.   INDICATIONS FOR PROCEDURE:  The patient is a 75 year old female with an  infected right total knee which was resected with placement of an antibiotic  spacer approximately eight weeks ago.  She presents now for reimplantation  with a revision prosthesis.   DESCRIPTION OF PROCEDURE:  After successful administration of general  anesthetic, tourniquet was placed on the right thigh.  The right lower  extremity was prepped and draped in the usual sterile fashion.  Extremity  was wrapped in an Esmarch, the knee flexed and tourniquet inflated to 400  mmHg.  Her previous midline incision was utilized and skin cut with a 10  blade through the subcutaneous tissues.  A median parapatellar incision was  made with a fresh blade.  The intra-articular fluid looked clear.  No  evidence of any infectious appearing tissue or fluid.  Fluid sent for stat  Gram stain and culture and sensitivity which came back no organisms.  The  two frozen sections subsequently sent which came back with no evidence of  any acute inflammation.  The  antibiotic spacer is removed.  The joint was  thoroughly irrigated.  All granulation tissue was excised to get healthy  appearing tissue throughout the joint.  At this point canals were drilled  and intramedullary reaming up to 18 mm in femoral size and went up to 15 on  the tibial size to allow for 13 mm cemented stem.  The intramedullary tibial  __________ guide was placed and the tibial cut freshened down to a nice flat  surface.  We then placed a trial size 2.5 tibia with 13 x 60 stem extension.  Rotation was marked with the medial aspect of the tibial tubercle.  On the  femoral side we placed the trial 18 mm sleeve in there and then did the  distal cut.  I was able to get bone off of the medial side but laterally, we  had to go to the +4 position and put  a 4 mm distal augment.  We then placed  the spacer block and got our alignment correct and did our AP cuts.  We put  the stem in the +2 position to effectively raise the stem and lower  prosthesis down so that the anterior flange was at the level of the anterior  femoral cortex.  The intercondylar and chamfer block was placed at a size 3  and those cuts subsequently made with a TC3 component.  We then traced the  trial size 3 TC3 femur, 18 mm stem x 125 with the 4 mm distal augment on the  lateral side, 4 mm posterior augment on the medial side.  We then placed a  tibial trial already described.  Got up to a 25 mm insert.  In extension it  was tight, in flexion it was slightly loose.  I then went back and redid the  femur taking an additional  4 mm.  We thus did not need any distal augments.  We placed the trial femur  this time.  We then placed a 10 mm platform on the tibial side, both medial  and lateral.  We thus went to a 15 mm insert, achieved full extension and  had excellent balance in flexion and extension.  At this point all the  permanent implants were opened.  We then put together the tibial side which  consisted of a  cemented stem 13 x 60 mm on a size 3 modular tray without  offset.  I then put a 10 mm medial and lateral augments on the undersurface  of the tibial tray.  On the femoral side, we had the size 3 femur with a 4  mm posterior medial augment and then the 18 x 125 press fit stem in the +2  position.  This was all put together and set on the back table.  We still  had the trials in place in the patient  and assessed patellar tracking.  The  patella tracked well.  There was too little of the patellar shell left to  consider doing a patellar component, thus we left the patella alone and it  was already covered with soft tissue, thus the bone would not be abrading on  the prosthesis.  All the trials were then removed and the joint prepared  with pulsatile lavage.  Put the cement restrictor at the appropriate gap in  the tibial canal after sizing it for a 4.  We then cleared the canals with  the pulsatile lavage.  Cement was mixed using Palacos cement with 2 gm of  vancomycin.  Once this was ready for implantation, this was injected into  the tibial canal pressurized.  I then placed the tibial component and put  the 15 trial insert and then placed a femoral component.  There was still a  little bit of play in flexion.  I think that was secondary to the fact that  the components were impacted further down to bone.  I then put a 17.5 trial  and this had great stability.  We took that out and put a 17.5 mm permanent  TC3 insert.  I was very pleased with the range of motion and stability with  this in place.  We reirrigated with antibiotic solution and once the cement  was fully hardened, all extruded cement was removed.  The extensor mechanism  was then closed with interrupted #1 PDS over a Hemovac drain.  Tourniquet  was released with a  total time of one hour and 40 minutes.  Minor bleeding  was stopped with cautery.  Subcutaneous tissues were closed in two layers with interrupted 0 and 2-0 Vicryl and  the skin closed with staples.  Drain  was hooked to suction.  She was placed in a knee immobilizer, awakened and  anesthesia was to perform a femoral block.                                                 Gus Rankin Aluisio, M.D.    FVA/MEDQ  D:  01/01/2002  T:  01/05/2002  Job:  [id number]

## 2010-09-28 NOTE — Op Note (Signed)
Jody Porter, Jody Porter           ACCOUNT NO.:  [id number]   MEDICAL RECORD NO.:  [id number]          PATIENT TYPE:  AMB   LOCATION:  ENDO                         FACILITY:  [facility]   PHYSICIAN:  Danise Edge, M.D.   DATE OF BIRTH:  08/31/1935   DATE OF PROCEDURE:  02/13/2005  DATE OF DISCHARGE:                                 OPERATIVE REPORT   REFERRING PHYSICIAN:  Dr. Kathrynn Running.   PROCEDURE INDICATIONS:  Ms. Kimie Pidcock is a 75 year old female born  07/02/1935. Ms. 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 Her maternal grandmother had colon cancer.   ENDOSCOPIST:  Danise Edge, M.D.   PREMEDICATION:  Versed 7 milligrams, Demerol 50 milligrams.   PROCEDURE:  After obtaining informed consent Ms. Rabelo was placed in  the left lateral decubitus position. I administered intravenous Demerol and  intravenous Versed to achieve conscious sedation for the procedure. The  patient's blood pressure, oxygen saturation and cardiac rhythm were  monitored throughout the procedure and documented in the medical record.   Anal inspection and digital rectal exam were normal. The Olympus adjustable  pediatric colonoscope was introduced into the rectum and advanced to the  cecum. A normal-appearing ileocecal valve was intubated and the distal ileum  inspected. Colonic preparation for the  exam today was excellent.   Rectum normal. Retroflex view of the distal rectum normal.  Sigmoid colon and descending colon.  Left colonic diverticulosis.  Splenic flexure normal.  Transverse colon normal.  Hepatic flexure normal.  Descending colon normal.  Cecum and ileocecal valve normal.  Distal ileum  normal.   ASSESSMENT:  Normal proctocolonoscopy to the cecum with inspection of the  distal ileum. Left colonic diverticulosis present.           ______________________________  Danise Edge, M.D.     MJ/MEDQ  D:  02/13/2005  T:  02/13/2005   Job:  [id number]   cc:   Lianne Bushy, M.D.  Fax: [phone]

## 2010-09-28 NOTE — Op Note (Signed)
[facility]  Patient:    Jody Porter, Jody Porter            MRN: [id number] Proc. Date: 03/19/00 Adm. Date:  [id number] Attending:  [name] V                           Operative Report  PREOPERATIVE DIAGNOSIS:  Infected right knee.  POSTOPERATIVE DIAGNOSIS:  Infected right knee.  PROCEDURE:  Irrigation and debridement, right knee.  SURGEON:  Dr. Lequita Halt.  ASSISTANT:  None.  ANESTHESIA:  General.  ESTIMATED BLOOD LOSS:  200.  DRAINS:  Hemovac x 1 to limbs.  COMPLICATIONS:  None.  TOURNIQUET TIME:  17 minutes at 400 mmHg.  CONDITION:  Stable to recovery.  BRIEF CLINICAL NOTE:  Jody Porter is a 75 year old female who had a total knee arthroplasty done approximately 6 months ago and then sustained a patella tendon rupture approximately 2 weeks postop. She had a talotendon repair. She presented with increased pain about 5 days ago with draining from the inferior aspect of the old incision. She did not have fever, chills or systemic symptoms. She was subsequently admitted to the hospital and presents now for I&d.  DESCRIPTION OF PROCEDURE:  After successful administration of general anesthetic, a tourniquet was placed high on the right thigh and right lower extremity prepped and draped in the usual sterile fashion. The previous incision was utilized, skin cut with a 10 blade through subcutaneous tissue and the tract is excised. The dissection is taken down through the extensor mechanism. The area where the patella tendon was repaired had a 1 x 2 cm defect out laterally. The tendon overall was intact except for this defect area. The tract did track down to this. Tissue sent for Gram stain C&S and fluid was also sent. There was no evidence of any obvious purulence or gross infection. This was more fat necrosis and necrosis of the small portion of the tendon. A medial arthrotomy was then made with a fresh blade and the joint inspected. The  interfaces between bone and prosthesis are solid throughout. There is no evidence of any softening of the bone at the interface and no evidence of any loosening of the components. The polyethylene also looks fine. The wound is copiously irrigated. The first 3 bleeders with saline were pulsatile lavaged and another liter of antibiotic solution and another 3 liters of saline. In between the saline, the remaining tissue that looked abnormal was debrided. There was a very healthy appearing tissue at the end of the second bag of saline. Of note when the tourniquet was up, it was more a venous tourniquet and it was released after 12 minutes because of a lot of back bleeding. Once the tourniquet was released, the blood loss essentially stopped. Once the debridement and irrigation is completed then the medial arthrotomy is closed with interrupted #1 PDS. The open area is closed down about half way by reapproximating the adjacent tissues. There is still about a 1 x 1 cm defect not communicating to the joint. The subcu is closed in 3 layers deep with interrupted #1 PDS then #0 Vicryl and then 2-0 Vicryl. The skin really came together very nicely even in the area where the tract was excised. There was no excess tension. The skin was closed with staples. A Hemovac drain had been placed deep to the extensor mechanism coming through that defect plus it was partially deep and partially in subcutaneous tissue. This will  remain in until the drainage ceases. She is placed into a bulky sterile dressing, drains hooked to suction. She is awakened and transported to the recovery room in stable condition. DD:  03/19/00 TD:  03/20/00 Job: [id number]

## 2010-09-28 NOTE — Op Note (Signed)
Jody Porter. [facility]  Patient:    Jody Porter, Jody Porter            MRN: [id number] Proc. Date: 10/03/99 Adm. Date:  [id number] Disc. Date: [id number] Attending:  [name] V                           Operative Report  PREOPERATIVE DIAGNOSIS:  Right ruptured patella tendon.  POSTOPERATIVE DIAGNOSIS:  Right ruptured patella tendon.  OPERATION PERFORMED:  Right patella tendon repair.  SURGEON:  [name], M.D.  ASSISTANT:  Alexzandrew L. Perkins, P.A.-C.  ANESTHESIA:  General.  ESTIMATED BLOOD LOSS:  Minimal.  DRAINS:  None.  TOURNIQUET TIME:  50 mg at 400 mmHg.  COMPLICATIONS:  None.  CONDITION:  Stable to recovery.  INDICATIONS FOR PROCEDURE:  Jody Porter is a 75 year old female who had a right total knee arthroplasty done approximately two weeks ago and had a fall last week.  Since then she is unable to extend her knee.  She was seen in the office two days ago and noted to have lack of extension.  Radiograph taken then showed patella alta.  This was consistent with patellar tendon rupture and she presents now for repair.  DESCRIPTION OF PROCEDURE:  After successful administration of general anesthetic, a tourniquet was placed on the right thigh and the right lower extremity was prepped and draped in the usual sterile fashion.  The extremity was wrapped in an Esmarch and the tourniquet inflated to 400.  Prior incisions were utilized.  Skin cut with a 10 blade through subcutaneous tissues to the level of the extensor mechanism.  She had torn her patellar tendon at its junction with the patella on the lateral part and then on the junction with the tubercle in the medial aspect.  She also had torn the medial and lateral retinaculum.  This wound is copiously irrigated with pulsatile lavage to evacuate the hematoma.  All her components appear in good position including the patellar component.  At this point a drill hole was placed through  the tubercle transversely and one suture passed through there, #5 Ethibond. Another 35 Ethibond was woven from proximal to distal through the medial aspect of the tendon with a Bunnell type stitch.  The lateral portion of the tendon has a weave from distal to proximal with a Bunnell and then two holes were placed in the patella distal to the component.  At this point two Mitek anchors were then placed just proximal to the tubercle into the tibia and those sutures were passed up through the patellar tendon on the medial side as well as the lateral side.  The medial aspect was advanced down through the tubercle and the two ends of the suture were tied to the other suture that was placed transversely through the bone.  This led to very stable reduction. Laterally the suture was then passed through two drill holes of the patella and the tendon was tied down to the bone.  The other sutures that were passed via the Mitek anchor were tied to give effective reduction to the bone more proximally.  The retinaculum was then repaired with interrupted #5 Ethibond. This all led to stable reduction.  Flexion against gravity was only 45 degrees.  A Restore biologic patch was then placed over the tendon since it was thin, in order to help regenerate thicker tissue.  This was tacked down to the tendon and on the retinaculum with interrupted #  1 Vicryl.  Once this was all completed, the tourniquet was released with a total time of 50 minutes. The subcu was closed in two layers with interrupted 0, then 2-0 Vicryl.  The skin was closed with staples.  The incision was cleaned and dried and bulky sterile dressing applied.  She will be held in full extension for four weeks and then very slowly begin flexion exercises.  I wont get her down to 90 degrees of flexion probably for at least eight weeks.  The patient is subsequently awakened and transported to recovery in stable condition. DD:  10/03/99 TD:   10/08/99 Job: [id number]

## 2010-09-28 NOTE — H&P (Signed)
[facility]  Patient:    Jody Porter, Jody Porter Visit Number: [id number] MRN: [id number]          Service Type: Attending:  [name], M.D. Dictated by:   Dorie Rank, P.A. Adm. Date:  07/17/01   CC:         Gaylene Brooks, M.D., [facility] [place]   History and Physical  DATE OF BIRTH:  [birth year]  CHIEF COMPLAINT:  Right knee pain.  HISTORY OF PRESENT ILLNESS:  Jody Porter is a pleasant 75 year old female who underwent a right total knee arthroplasty in May of 2001.  Unfortunately, she went on to develop a subsequent infection to that right total knee.  She underwent I&D in November of 2001.  Over the past several weeks she has developed continued erythema and drainage to that area, as well as continued pain.  It was thought she would benefit from undergoing an I&D to that knee. The risks and benefits of all the procedures were discussed with the patient and she elected to proceed.  ALLERGIES AND INTOLERANCES:  PENICILLIN causes a rash.  ZOVIRAX - rash, VALIUM - confusion, ASPIRIN - palpitations, OGEN and PROGESTERONE cause nausea and vomiting.  VOLTAREN causes "liver problems," and CODEINE makes her upset.  MEDICATIONS: 1. Amitriptyline one p.o. q.d. 2. Ultracet p.r.n. 3. Over the counter Benadryl p.r.n.  PAST MEDICAL HISTORY: 1. Significant for seasonal allergies. 2. Hypoglycemia. 3. History of constipation with impaction.  PAST SURGICAL HISTORY: 1. May 2001, right total knee arthroplasty. 2. I&D in November 2001 to right total knee. 3. Cholecystectomy. 4. Appendectomy. 5. C-section x4.  SOCIAL HISTORY:  The patient is a widow.  She lives alone.  She denies any tobacco or alcohol use.  It is of note that she is a Jehovah witness and does not want any blood products.  FAMILY HISTORY:  Mother living age 41, history of skin, colorectal cancer. Father deceased age 37, history of cerebral hemorrhage and hypoglycemia.  REVIEW OF  SYMPTOMS:  GENERAL:  No fevers, chills, night sweats, or bleeding tendencies.  PULMONARY:  No shortness of breath, productive cough, or hemoptysis.  CARDIOVASCULAR:  No chest pain, angina, or orthopnea. GASTROINTESTINAL:  Constipation, no diarrhea, melena, nausea, or vomiting. GENITOURINARY:  No hematuria, dysuria, or discharge.  NEUROLOGIC:  No seizures, headaches, or paralysis.  MUSCULOSKELETAL:  Significant for bilateral knee pain.  PHYSICAL EXAMINATION:  GENERAL:  Alert and oriented, obese, 75 year old female, conversant, well-developed, well-nourished.  HEENT:  Head is atraumatic, normocephalic.  Oropharynx is clear.  NECK:  Supple.  Negative for carotid bruits, negative for cervical lymphadenopathy.  CHEST:  Lungs clear to auscultation bilaterally.  No wheezes, rhonchi, or rales.  BREASTS:  Not pertinent to present illness.  HEART:  S1, S2, negative for murmur, rub, or gallop.  ABDOMEN:  Round, nontender.  Positive bowel sounds.  GENITOURINARY:  Not pertinent to present illness.  EXTREMITIES:  There is some mild edema to the right knee.  There is no warmth or erythema.  She has some superficial layers of skin that are soft and fluctuant.  SKIN:  Intact.  There is a scar along the anterior right knee.  LABORATORY:  Preoperative labs and x-rays are pending.  IMPRESSION: 1. Infected right total knee arthroplasty. 2. Seasonal allergies. 3. History of constipation.  PLAN:  The patient will be scheduled for an I&D and a right total knee arthroplasty with Dr. [name]. Dictated by:   Dorie Rank, P.A. Attending:  [name], M.D. DD:  07/16/01 TD:  07/16/01 Job: [id number]

## 2010-11-08 ENCOUNTER — Encounter: Payer: Self-pay | Admitting: Internal Medicine

## 2010-11-09 ENCOUNTER — Ambulatory Visit (INDEPENDENT_AMBULATORY_CARE_PROVIDER_SITE_OTHER): Payer: Medicare Other | Admitting: Internal Medicine

## 2010-11-09 ENCOUNTER — Encounter: Payer: Self-pay | Admitting: Internal Medicine

## 2010-11-09 VITALS — BP 126/82 | HR 83 | Ht <= 58 in | Wt 211.8 lb

## 2010-11-09 DIAGNOSIS — J309 Allergic rhinitis, unspecified: Secondary | ICD-10-CM

## 2010-11-09 DIAGNOSIS — G4700 Insomnia, unspecified: Secondary | ICD-10-CM

## 2010-11-09 NOTE — Assessment & Plan Note (Addendum)
She asks help to get "energy". She denies depression and seems very outgoing and talkative. Itr may be a perception issue, neuromuscular or something else. I will let her explore Remus Loffler for sleep consolidation, and a sample of Nuvigil for daytime use to see what she feels from these.

## 2010-11-09 NOTE — Patient Instructions (Signed)
Ok to use ambien at bedtime for sleep as needed  Try sample Nuvigil 150, 1/2 or one in the mornings to see if it makes you feel better.

## 2010-11-09 NOTE — Progress Notes (Signed)
  Subjective:    Patient ID: JUNA CABAN, female    DOB: Jan 07, 1936, 75 y.o.   MRN: [id number]  HPI 11/09/10- 59 yo F  followed for Obstructive Sleep Apnea Last here August 18, 2009  NPSG form 07/13/09 showed AHI 3.5/hr, RDI 29.9/ hr. Tried CPAP for RDI but panicked- too claustrophobic and won't try again.  Feels exhausted. Fighting daytime "exhaustion/ tiredness" x 2 years. She asks if it could be an allergy to sugar. Awake off and on at night for long periods. Unusal that she slept 5 hours once. Ever since childhood has never slept much at one time. Caffeine little effect. Has Remus Loffler she has never tried for fear it would be unsafe with sleep apnea. Is on thyroid replacement with no benefit. We discussed meaning of "exhaustion' and that it may not be a sleep issue.   Review of Systems Constitutional:   No weight loss, night sweats,  Fevers, chills, fatigue, lassitude. HEENT:   No headaches,  Difficulty swallowing,  Tooth/dental problems,  Sore throat,                No sneezing, itching, ear ache, nasal congestion, post nasal drip,   CV:  No chest pain, orthopnea, PND, swelling in lower extremities, anasarca, dizziness, palpitations  GI  No heartburn, indigestion, abdominal pain, nausea, vomiting, diarrhea, change in bowel habits, loss of appetite  Resp: No shortness of breath with exertion or at rest.  No excess mucus, no productive cough,  No non-productive cough,  No coughing up of blood.  No change in color of mucus.  No wheezing.   Skin: no rash or lesions.  GU: no dysuria, change in color of urine, no urgency or frequency.  No flank pain.  MS:  No joint pain or swelling.  No decreased range of motion.  No back pain.  Psych:  No change in mood or affect. No depression or anxiety.  No memory loss.      Objective:   Physical Exam General- Alert, Oriented, Affect-appropriate, Distress- none acute  Overweight, talkative  Skin- rash-none, lesions- none, excoriation-  none  Lymphadenopathy- none  Head- atraumatic  Eyes- Gross vision intact, PERRLA, conjunctivae clear secretions  Ears- Hearing, canals, Tm- normal  Nose- Clear, No Septal dev, mucus, polyps, erosion, perforation   Throat- Mallampati II , mucosa clear , drainage- none, tonsils- atrophic  Neck- flexible , trachea midline, no stridor , thyroid nl, carotid no bruit  Chest - symmetrical excursion , unlabored     Heart/CV- RRR , no murmur , no gallop  , no rub, nl s1 s2                     - JVD- none , edema- none, stasis changes- none, varices- none     Lung- clear to P&A, wheeze- none, cough- none , dullness-none, rub- none     Chest wall-   Abd- tender-no, distended-no, bowel sounds-present, HSM- no  Br/ Gen/ Rectal- Not done, not indicated  Extrem- cyanosis- none, clubbing, none, atrophy- none, strength- nl  Neuro- grossly intact to observation        Assessment & Plan:

## 2010-11-14 NOTE — Assessment & Plan Note (Signed)
This doesn't sem to be bothering her sleep or disturbing her enough that she wants to do much about it. Discussed nasal saline.

## 2010-11-15 ENCOUNTER — Telehealth: Payer: Self-pay | Admitting: Internal Medicine

## 2010-11-15 NOTE — Telephone Encounter (Signed)
LMOMTCB

## 2010-11-16 MED ORDER — ARMODAFINIL 150 MG PO TABS: ORAL_TABLET | ORAL | Status: DC

## 2010-11-16 NOTE — Telephone Encounter (Signed)
Per CY-okay if insurance will agree Nuvigil 150mg  #30 take 1 daily as needed with 5 refills.

## 2010-11-16 NOTE — Telephone Encounter (Signed)
Spoke with the pt and she states she loves the nuvigil. She states that it has relieved her of the "fuzzy" head feeling that she used to have during the day. She is requesting an rx be sent to [facility]. Please advise if ok to send. Carron Curie, CMA

## 2010-11-16 NOTE — Telephone Encounter (Signed)
Called, spoke with pt.  She is aware nuvigil 150 mg take 1 daily as needed will be called into [facility].  She is aware insurance may not cover this -- if this is the case I advised her to call office back to let us know.  She verbalized understanding of these instructions.  Rx called into [facility].

## 2010-12-12 ENCOUNTER — Telehealth: Payer: Self-pay | Admitting: Internal Medicine

## 2010-12-12 NOTE — Telephone Encounter (Signed)
Spoke with pt. She is requesting alternative for nuvigil since ins will not cover. They did not give list of alternatives, just rec that we prescribe something else. CDY, pls advise, thanks!

## 2010-12-13 NOTE — Telephone Encounter (Signed)
LMOMTCB x 1 

## 2010-12-13 NOTE — Telephone Encounter (Signed)
Offer to try generic adderall 10 mg XR   # 30, 1 daily prn, no refill

## 2010-12-14 MED ORDER — AMPHETAMINE-DEXTROAMPHET ER 10 MG PO CP24: 10.0000 mg | ORAL_CAPSULE | Freq: Every day | ORAL | Status: AC | PRN

## 2010-12-14 NOTE — Telephone Encounter (Signed)
Called, spoke with pt.  She is aware CDY recs she try adderall xr 10mg  qd prn.  She verbalized understanding and requesting rx to be mailed to home address which was incorrect in system.  I correct this for pt -- [address], [place], [place] [address].  Rx printed and placed on CDY's cart for signature.

## 2010-12-14 NOTE — Telephone Encounter (Signed)
Pt returned call Krista L Lilly  °

## 2010-12-14 NOTE — Telephone Encounter (Signed)
Spoke with pt and she is aware rx was printed off and signed and was put in the mail to the address listed in phone note. Pt verbalized understanding and had no further questions

## 2011-01-10 ENCOUNTER — Ambulatory Visit: Payer: Medicare Other | Admitting: Internal Medicine

## 2011-05-29 ENCOUNTER — Encounter (INDEPENDENT_AMBULATORY_CARE_PROVIDER_SITE_OTHER): Payer: Medicare Other | Admitting: Ophthalmology

## 2011-05-29 DIAGNOSIS — H43819 Vitreous degeneration, unspecified eye: Secondary | ICD-10-CM | POA: Diagnosis not present

## 2011-05-29 DIAGNOSIS — H3530 Unspecified macular degeneration: Secondary | ICD-10-CM

## 2011-06-13 ENCOUNTER — Ambulatory Visit (INDEPENDENT_AMBULATORY_CARE_PROVIDER_SITE_OTHER): Payer: Medicare Other | Admitting: Ophthalmology

## 2011-07-05 DIAGNOSIS — H26499 Other secondary cataract, unspecified eye: Secondary | ICD-10-CM | POA: Diagnosis not present

## 2011-07-05 DIAGNOSIS — H20019 Primary iridocyclitis, unspecified eye: Secondary | ICD-10-CM | POA: Diagnosis not present

## 2011-08-01 DIAGNOSIS — H20019 Primary iridocyclitis, unspecified eye: Secondary | ICD-10-CM | POA: Diagnosis not present

## 2011-08-01 DIAGNOSIS — H26499 Other secondary cataract, unspecified eye: Secondary | ICD-10-CM | POA: Diagnosis not present

## 2011-09-06 DIAGNOSIS — H20019 Primary iridocyclitis, unspecified eye: Secondary | ICD-10-CM | POA: Diagnosis not present

## 2011-09-06 DIAGNOSIS — H26499 Other secondary cataract, unspecified eye: Secondary | ICD-10-CM | POA: Diagnosis not present

## 2011-10-14 DIAGNOSIS — H26499 Other secondary cataract, unspecified eye: Secondary | ICD-10-CM | POA: Diagnosis not present

## 2011-10-14 DIAGNOSIS — H35359 Cystoid macular degeneration, unspecified eye: Secondary | ICD-10-CM | POA: Diagnosis not present

## 2011-12-09 DIAGNOSIS — H26499 Other secondary cataract, unspecified eye: Secondary | ICD-10-CM | POA: Diagnosis not present

## 2011-12-09 DIAGNOSIS — H35359 Cystoid macular degeneration, unspecified eye: Secondary | ICD-10-CM | POA: Diagnosis not present

## 2012-01-06 DIAGNOSIS — H26499 Other secondary cataract, unspecified eye: Secondary | ICD-10-CM | POA: Diagnosis not present

## 2012-01-06 DIAGNOSIS — H35359 Cystoid macular degeneration, unspecified eye: Secondary | ICD-10-CM | POA: Diagnosis not present

## 2012-01-07 DIAGNOSIS — G4700 Insomnia, unspecified: Secondary | ICD-10-CM | POA: Diagnosis not present

## 2012-01-07 DIAGNOSIS — R5381 Other malaise: Secondary | ICD-10-CM | POA: Diagnosis not present

## 2012-01-07 DIAGNOSIS — F411 Generalized anxiety disorder: Secondary | ICD-10-CM | POA: Diagnosis not present

## 2012-01-07 DIAGNOSIS — R5383 Other fatigue: Secondary | ICD-10-CM | POA: Diagnosis not present

## 2012-01-07 DIAGNOSIS — E213 Hyperparathyroidism, unspecified: Secondary | ICD-10-CM | POA: Diagnosis not present

## 2012-01-15 DIAGNOSIS — E213 Hyperparathyroidism, unspecified: Secondary | ICD-10-CM | POA: Diagnosis not present

## 2012-01-15 DIAGNOSIS — R5381 Other malaise: Secondary | ICD-10-CM | POA: Diagnosis not present

## 2012-03-02 DIAGNOSIS — H20019 Primary iridocyclitis, unspecified eye: Secondary | ICD-10-CM | POA: Diagnosis not present

## 2012-03-02 DIAGNOSIS — H26499 Other secondary cataract, unspecified eye: Secondary | ICD-10-CM | POA: Diagnosis not present

## 2012-03-02 DIAGNOSIS — H35359 Cystoid macular degeneration, unspecified eye: Secondary | ICD-10-CM | POA: Diagnosis not present

## 2012-04-06 DIAGNOSIS — H35359 Cystoid macular degeneration, unspecified eye: Secondary | ICD-10-CM | POA: Diagnosis not present

## 2012-04-13 DIAGNOSIS — H353190 Nonexudative age-related macular degeneration, unspecified eye, stage unspecified: Secondary | ICD-10-CM | POA: Diagnosis not present

## 2012-04-13 DIAGNOSIS — H35379 Puckering of macula, unspecified eye: Secondary | ICD-10-CM | POA: Diagnosis not present

## 2012-04-13 DIAGNOSIS — H43819 Vitreous degeneration, unspecified eye: Secondary | ICD-10-CM | POA: Diagnosis not present

## 2012-04-13 DIAGNOSIS — H35359 Cystoid macular degeneration, unspecified eye: Secondary | ICD-10-CM | POA: Diagnosis not present

## 2012-04-21 DIAGNOSIS — H35379 Puckering of macula, unspecified eye: Secondary | ICD-10-CM | POA: Diagnosis not present

## 2012-04-23 DIAGNOSIS — F3289 Other specified depressive episodes: Secondary | ICD-10-CM | POA: Diagnosis not present

## 2012-04-23 DIAGNOSIS — M545 Low back pain, unspecified: Secondary | ICD-10-CM | POA: Diagnosis not present

## 2012-04-23 DIAGNOSIS — E039 Hypothyroidism, unspecified: Secondary | ICD-10-CM | POA: Diagnosis not present

## 2012-04-23 DIAGNOSIS — F329 Major depressive disorder, single episode, unspecified: Secondary | ICD-10-CM | POA: Diagnosis not present

## 2012-04-23 DIAGNOSIS — K219 Gastro-esophageal reflux disease without esophagitis: Secondary | ICD-10-CM | POA: Diagnosis not present

## 2012-05-21 DIAGNOSIS — F329 Major depressive disorder, single episode, unspecified: Secondary | ICD-10-CM | POA: Diagnosis not present

## 2012-05-21 DIAGNOSIS — F411 Generalized anxiety disorder: Secondary | ICD-10-CM | POA: Diagnosis not present

## 2012-05-21 DIAGNOSIS — F3289 Other specified depressive episodes: Secondary | ICD-10-CM | POA: Diagnosis not present

## 2012-06-05 DIAGNOSIS — R7309 Other abnormal glucose: Secondary | ICD-10-CM | POA: Diagnosis not present

## 2012-06-05 DIAGNOSIS — E119 Type 2 diabetes mellitus without complications: Secondary | ICD-10-CM | POA: Diagnosis not present

## 2012-06-05 DIAGNOSIS — E039 Hypothyroidism, unspecified: Secondary | ICD-10-CM | POA: Diagnosis not present

## 2012-06-05 DIAGNOSIS — F411 Generalized anxiety disorder: Secondary | ICD-10-CM | POA: Diagnosis not present

## 2012-06-05 DIAGNOSIS — R5383 Other fatigue: Secondary | ICD-10-CM | POA: Diagnosis not present

## 2012-06-05 DIAGNOSIS — K219 Gastro-esophageal reflux disease without esophagitis: Secondary | ICD-10-CM | POA: Diagnosis not present

## 2012-06-05 DIAGNOSIS — R5381 Other malaise: Secondary | ICD-10-CM | POA: Diagnosis not present

## 2012-07-02 DIAGNOSIS — R7989 Other specified abnormal findings of blood chemistry: Secondary | ICD-10-CM | POA: Diagnosis not present

## 2012-07-02 DIAGNOSIS — R030 Elevated blood-pressure reading, without diagnosis of hypertension: Secondary | ICD-10-CM | POA: Diagnosis not present

## 2012-07-02 DIAGNOSIS — R7309 Other abnormal glucose: Secondary | ICD-10-CM | POA: Diagnosis not present

## 2012-08-31 DIAGNOSIS — H35379 Puckering of macula, unspecified eye: Secondary | ICD-10-CM | POA: Diagnosis not present

## 2012-08-31 DIAGNOSIS — H01009 Unspecified blepharitis unspecified eye, unspecified eyelid: Secondary | ICD-10-CM | POA: Diagnosis not present

## 2012-09-25 DIAGNOSIS — Z9181 History of falling: Secondary | ICD-10-CM | POA: Diagnosis not present

## 2012-09-25 DIAGNOSIS — E119 Type 2 diabetes mellitus without complications: Secondary | ICD-10-CM | POA: Diagnosis not present

## 2012-09-25 DIAGNOSIS — E039 Hypothyroidism, unspecified: Secondary | ICD-10-CM | POA: Diagnosis not present

## 2012-09-25 DIAGNOSIS — R5383 Other fatigue: Secondary | ICD-10-CM | POA: Diagnosis not present

## 2012-09-25 DIAGNOSIS — I10 Essential (primary) hypertension: Secondary | ICD-10-CM | POA: Diagnosis not present

## 2012-09-25 DIAGNOSIS — R030 Elevated blood-pressure reading, without diagnosis of hypertension: Secondary | ICD-10-CM | POA: Diagnosis not present

## 2012-09-25 DIAGNOSIS — R5381 Other malaise: Secondary | ICD-10-CM | POA: Diagnosis not present

## 2012-09-25 DIAGNOSIS — Z13220 Encounter for screening for lipoid disorders: Secondary | ICD-10-CM | POA: Diagnosis not present

## 2012-10-01 DIAGNOSIS — H353190 Nonexudative age-related macular degeneration, unspecified eye, stage unspecified: Secondary | ICD-10-CM | POA: Diagnosis not present

## 2012-10-01 DIAGNOSIS — H43819 Vitreous degeneration, unspecified eye: Secondary | ICD-10-CM | POA: Diagnosis not present

## 2012-10-01 DIAGNOSIS — H35379 Puckering of macula, unspecified eye: Secondary | ICD-10-CM | POA: Diagnosis not present

## 2012-10-01 DIAGNOSIS — H35359 Cystoid macular degeneration, unspecified eye: Secondary | ICD-10-CM | POA: Diagnosis not present

## 2012-10-06 DIAGNOSIS — G4730 Sleep apnea, unspecified: Secondary | ICD-10-CM | POA: Diagnosis not present

## 2012-10-06 DIAGNOSIS — Z6841 Body Mass Index (BMI) 40.0 and over, adult: Secondary | ICD-10-CM | POA: Diagnosis not present

## 2012-10-06 DIAGNOSIS — E039 Hypothyroidism, unspecified: Secondary | ICD-10-CM | POA: Diagnosis not present

## 2012-10-06 DIAGNOSIS — I10 Essential (primary) hypertension: Secondary | ICD-10-CM | POA: Diagnosis not present

## 2012-11-26 DIAGNOSIS — H01009 Unspecified blepharitis unspecified eye, unspecified eyelid: Secondary | ICD-10-CM | POA: Diagnosis not present

## 2012-11-26 DIAGNOSIS — H353190 Nonexudative age-related macular degeneration, unspecified eye, stage unspecified: Secondary | ICD-10-CM | POA: Diagnosis not present

## 2012-11-26 DIAGNOSIS — H35359 Cystoid macular degeneration, unspecified eye: Secondary | ICD-10-CM | POA: Diagnosis not present

## 2012-11-26 DIAGNOSIS — H43819 Vitreous degeneration, unspecified eye: Secondary | ICD-10-CM | POA: Diagnosis not present

## 2012-12-09 ENCOUNTER — Ambulatory Visit (INDEPENDENT_AMBULATORY_CARE_PROVIDER_SITE_OTHER): Payer: Medicare Other | Admitting: Nurse Practitioner

## 2012-12-09 ENCOUNTER — Encounter: Payer: Self-pay | Admitting: Nurse Practitioner

## 2012-12-09 VITALS — BP 164/92 | HR 82 | Temp 97.0°F | Resp 14 | Ht 58.5 in | Wt 205.4 lb

## 2012-12-09 DIAGNOSIS — G4700 Insomnia, unspecified: Secondary | ICD-10-CM | POA: Diagnosis not present

## 2012-12-09 DIAGNOSIS — F329 Major depressive disorder, single episode, unspecified: Secondary | ICD-10-CM

## 2012-12-09 DIAGNOSIS — I10 Essential (primary) hypertension: Secondary | ICD-10-CM | POA: Diagnosis not present

## 2012-12-09 DIAGNOSIS — R5381 Other malaise: Secondary | ICD-10-CM

## 2012-12-09 DIAGNOSIS — R5383 Other fatigue: Secondary | ICD-10-CM

## 2012-12-09 DIAGNOSIS — R7309 Other abnormal glucose: Secondary | ICD-10-CM | POA: Diagnosis not present

## 2012-12-09 DIAGNOSIS — F3289 Other specified depressive episodes: Secondary | ICD-10-CM

## 2012-12-09 DIAGNOSIS — G4730 Sleep apnea, unspecified: Secondary | ICD-10-CM

## 2012-12-09 DIAGNOSIS — R739 Hyperglycemia, unspecified: Secondary | ICD-10-CM

## 2012-12-09 NOTE — Patient Instructions (Addendum)
Will need records from previous offices-- to get name of previous PCP and to get records from Dr Peggye Pitt office [facility] retina specialist) Follow up in 4 weeks for EV with MMSE and labs prior to visit.   Stop taking benadryl-- to be off 2 weeks before next visit.   Minimized caffeine use- no coffee after 2 pm  Be active throughout the day   Get blood pressure machine and take readings- after you have been sitting relaxed for 2 mins  Record and bring those readings to next visit.   Write down a food log - 1 week prior to next appt  If blood looks okay will call you in a medication to [facility] for anxiety and depression

## 2012-12-09 NOTE — Progress Notes (Signed)
Patient ID: Jody Porter, female   DOB: [birth year], 77 y.o.   MRN: [id number]   Allergies  Allergen Reactions  . Ciprofloxacin   . Diazepam   . Penicillins     Chief Complaint  Patient presents with  . NP to Establish    HPI: Patient is a 77 y.o. female seen in the office today to establish care Has previously been seen by multiple providers. Needs to figure out why she has no energy.  Reports the biggest concern today is she has no energy and no memory Has chronic back pain  Take benedryl 100 mg daily nerves and pain but reports she does not have anxiety- feels jittery, does not allow herself to get angry or mad.  Has always had trouble sleeping at night but in the last 4 years she has had no energy and a failing memory.  Was previoulsy Rx metformin, lisinopril, htcz, sertraline, citalopram however she does not take these due to the fact they do not work. Per pt She does not have HTN, diabetes, anxiety  Reports sertraline and citalopram made her drowsy but did not help Review of Systems:  Review of Systems  Constitutional: Positive for malaise/fatigue. Negative for weight loss.  HENT: Negative for neck pain.   Eyes:       Sees eye doctor regularly due to "right eye problems"    Respiratory: Negative for cough and shortness of breath.   Cardiovascular: Negative for chest pain, palpitations and leg swelling.  Gastrointestinal: Positive for heartburn. Negative for abdominal pain, diarrhea and constipation.  Genitourinary: Negative for dysuria, urgency and frequency.  Musculoskeletal: Positive for back pain and falls (fallen 4 times in the past 6 months). Negative for myalgias and joint pain.  Skin: Negative.   Neurological: Negative for dizziness, weakness and headaches.       Reports she is unbalanced  Endo/Heme/Allergies: Positive for environmental allergies.  Psychiatric/Behavioral: Positive for memory loss. The patient is nervous/anxious and has insomnia.      Past  Medical History  Diagnosis Date  . LBP (low back pain)   . Depression   . Hypertension   . GERD (gastroesophageal reflux disease)   . Hypothyroid   . Hyperparathyroidism   . Insomnia     w/ sleep apnea NPSG 07/13/2009 AHI 3.5, RDI 29.9/hr  . Allergic rhinitis   . Anxiety    Past Surgical History  Procedure Laterality Date  . Lumbar disc surgery    . Parathyroidectomy    . Cholecystectomy    . Knee surgery Right     reports multiple surgery on knee  . Carpel tunnel Bilateral   . Cesarean section    . Cataracts     Social History:   reports that she has never smoked. She does not have any smokeless tobacco history on file. She reports that she does not drink alcohol or use illicit drugs.  Family History  Problem Relation Age of Onset  . Cancer Mother   . Cancer Father   . Cancer Paternal Grandmother   . Cancer Maternal Grandmother     Medications: Patient's Medications  New Prescriptions   No medications on file  Previous Medications   BIOTIN 1000 MCG TABLET    Take 1,000 mcg by mouth daily.     DIPHENHYDRAMINE (BENADRYL) 25 MG CAPSULE    Take 25 mg by mouth at bedtime as needed.     IBUPROFEN (ADVIL,MOTRIN) 800 MG TABLET    Take 800 mg by mouth 2 (  two) times daily as needed.     KETOROLAC (ACULAR) 0.4 % SOLN    One drop right eye four times daily   PANTOPRAZOLE (PROTONIX) 40 MG TABLET    Take 40 mg by mouth daily.    Modified Medications   No medications on file  Discontinued Medications   ARMODAFINIL (NUVIGIL) 150 MG TABLET    Take 1 daily as needed   CETIRIZINE (ZYRTEC HIVES RELIEF) 10 MG TABLET    Take 10 mg by mouth daily.     CYANOCOBALAMIN (VITAMIN B-12) 2500 MCG SUBL    Place under the tongue daily.     LEVOTHYROXINE (SYNTHROID, LEVOTHROID) 100 MCG TABLET    Take 100 mcg by mouth daily.     LISINOPRIL-HYDROCHLOROTHIAZIDE (PRINZIDE,ZESTORETIC) 10-12.5 MG PER TABLET       MAGNESIUM 250 MG TABS    Take 1 tablet by mouth daily.     RANITIDINE (ZANTAC) 300 MG  TABLET    Take 300 mg by mouth daily.       Physical Exam:  Filed Vitals:   12/09/12 1314  BP: 164/92  Pulse: 82  Temp: 97 F (36.1 C)  TempSrc: Oral  Resp: 14  Height: 4' 10.5" (1.486 m)  Weight: 205 lb 6.4 oz (93.169 kg)    Physical Exam  Vitals reviewed. Constitutional: She is well-developed, well-nourished, and in no distress. No distress.  HENT:  Head: Normocephalic and atraumatic.  Right Ear: External ear normal.  Left Ear: External ear normal.  Mouth/Throat: Oropharynx is clear and moist. No oropharyngeal exudate.  Neck: Normal range of motion. Neck supple.  Cardiovascular: Normal rate, regular rhythm and normal heart sounds.   Pulmonary/Chest: Effort normal and breath sounds normal. No respiratory distress.  Abdominal: Soft. Bowel sounds are normal.  Musculoskeletal: Normal range of motion. She exhibits no edema.  Neurological: She is alert.  Skin: Skin is warm and dry. She is not diaphoretic. No erythema.       Assessment/Plan 1. Other malaise and fatigue Pt appears very alert and awake-- reports she has never slept well  Will have her stop taking benedryl 100 mg daily; decrease caffeine use and  - CBC With differential/Platelet - TSH - Hemoglobin A1c - Comprehensive metabolic panel  2. Hyperglycemia Was previously on metformin but does not remember taking this will get Hemoglobin A1c  3. INSOMNIA WITH SLEEP APNEA UNSPECIFIED Was previously following with Dr Maple Hudson; may need to follow up with him regarding sleep apnea and insomnia  4. HYPERTENSION Pt reports she does not have hypertension but blood pressure elevated today- will have her take BP log and bring to next visit.   5. DEPRESSION Willing to try another medication- will call pt in cymbalta 30 mg daily if tolerating may increase to 60 mg daily once blood work results since I do not have pts records

## 2012-12-10 ENCOUNTER — Other Ambulatory Visit: Payer: Self-pay | Admitting: Nurse Practitioner

## 2012-12-10 LAB — COMPREHENSIVE METABOLIC PANEL
ALT: 15 IU/L (ref 0–32)
Albumin: 4.2 g/dL (ref 3.5–4.8)
Alkaline Phosphatase: 116 IU/L (ref 39–117)
BUN: 16 mg/dL (ref 8–27)
CO2: 23 mmol/L (ref 18–29)
Chloride: 106 mmol/L (ref 97–108)
GFR calc Af Amer: 74 mL/min/{1.73_m2} (ref >59)
Glucose: 89 mg/dL (ref 65–99)
Potassium: 4 mmol/L (ref 3.5–5.2)
Total Bilirubin: 0.2 mg/dL (ref 0.0–1.2)
Total Protein: 6.5 g/dL (ref 6.0–8.5)

## 2012-12-10 LAB — CBC WITH DIFFERENTIAL
Basos: 0 % (ref 0–3)
Eos: 2 % (ref 0–5)
HCT: 41.8 % (ref 34.0–46.6)
Hemoglobin: 13.8 g/dL (ref 11.1–15.9)
Lymphocytes Absolute: 1.9 10*3/uL (ref 0.7–3.1)
Lymphs: 22 % (ref 14–46)
MCV: 86 fL (ref 79–97)
Monocytes: 10 % (ref 4–12)
Neutrophils Absolute: 5.8 10*3/uL (ref 1.4–7.0)
RBC: 4.84 x10E6/uL (ref 3.77–5.28)
WBC: 8.8 10*3/uL (ref 3.4–10.8)

## 2012-12-10 LAB — HEMOGLOBIN A1C: Hgb A1c MFr Bld: 6.4 % — ABNORMAL HIGH (ref 4.8–5.6)

## 2012-12-10 LAB — TSH: TSH: 6.05 u[IU]/mL — ABNORMAL HIGH (ref 0.450–4.500)

## 2012-12-10 MED ORDER — DULOXETINE HCL 30 MG PO CPEP: ORAL_CAPSULE | ORAL | Status: DC

## 2012-12-11 ENCOUNTER — Other Ambulatory Visit: Payer: Self-pay | Admitting: Geriatric Medicine

## 2012-12-11 MED ORDER — DULOXETINE HCL 30 MG PO CPEP: ORAL_CAPSULE | ORAL | Status: DC

## 2013-01-06 ENCOUNTER — Ambulatory Visit (INDEPENDENT_AMBULATORY_CARE_PROVIDER_SITE_OTHER): Payer: Medicare Other | Admitting: Nurse Practitioner

## 2013-01-06 ENCOUNTER — Encounter: Payer: Self-pay | Admitting: Nurse Practitioner

## 2013-01-06 VITALS — BP 140/110 | HR 79 | Temp 95.9°F | Resp 20 | Ht <= 58 in | Wt 200.0 lb

## 2013-01-06 DIAGNOSIS — M545 Low back pain, unspecified: Secondary | ICD-10-CM

## 2013-01-06 DIAGNOSIS — F329 Major depressive disorder, single episode, unspecified: Secondary | ICD-10-CM

## 2013-01-06 DIAGNOSIS — E039 Hypothyroidism, unspecified: Secondary | ICD-10-CM

## 2013-01-06 DIAGNOSIS — F3289 Other specified depressive episodes: Secondary | ICD-10-CM

## 2013-01-06 DIAGNOSIS — I10 Essential (primary) hypertension: Secondary | ICD-10-CM | POA: Diagnosis not present

## 2013-01-06 NOTE — Patient Instructions (Addendum)
May take tylenol 650 mg (2 of the 325 tablets) every 6 hours for pain  Ice and heat  Follow up in 3 months for EV with MMSE

## 2013-01-06 NOTE — Progress Notes (Signed)
Patient ID: Jody Porter, female   DOB: [birth year], 77 y.o.   MRN: [id number]   Allergies  Allergen Reactions  . Ciprofloxacin   . Diazepam   . Penicillins     Chief Complaint  Patient presents with  . Annual Exam    HPI: Patient is a 77 y.o. female seen in the office today for follow up Was ordinally scheduled for physical but does not want physical today- having increased back pain which happens occasionally- no injury- has taken ibuprofen for this. Normally pain comes and goes and ibuprofen helps.  Was feeling exhausted and then went off grain and now she is feeling 100 times better after 2 days off grain. She needs more endurance since it has been so long since she has had energy.  Has started to take cymbalta without side effects except she feels sleepy-- takes in the morning-- otherwise does not feel like she is having any other side effects -- no worsening depression or thought of harming herself or others  Blood pressure improved on home reading 133/76, 136/78, 148/78 Review of Systems:  Review of Systems  Constitutional: Negative for fever and chills.  Respiratory: Negative for cough and shortness of breath.   Cardiovascular: Negative for chest pain, palpitations and leg swelling.  Gastrointestinal: Negative for heartburn, abdominal pain, diarrhea and constipation.  Genitourinary: Negative for dysuria, urgency and frequency.  Musculoskeletal: Positive for myalgias and back pain.  Skin: Negative.   Neurological: Negative for dizziness, weakness and headaches.  Psychiatric/Behavioral: Positive for depression. The patient is nervous/anxious and has insomnia.      Past Medical History  Diagnosis Date  . LBP (low back pain)   . Depression   . Hypertension   . GERD (gastroesophageal reflux disease)   . Hypothyroid   . Hyperparathyroidism   . Insomnia     w/ sleep apnea NPSG 07/13/2009 AHI 3.5, RDI 29.9/hr  . Allergic rhinitis   . Anxiety    Past Surgical History   Procedure Laterality Date  . Lumbar disc surgery    . Parathyroidectomy    . Cholecystectomy    . Knee surgery Right     reports multiple surgery on knee  . Carpel tunnel Bilateral   . Cesarean section    . Cataracts     Social History:   reports that she has never smoked. She does not have any smokeless tobacco history on file. She reports that she does not drink alcohol or use illicit drugs.  Family History  Problem Relation Age of Onset  . Cancer Mother   . Cancer Father   . Cancer Paternal Grandmother   . Cancer Maternal Grandmother     Medications: Patient's Medications  New Prescriptions   No medications on file  Previous Medications   BIOTIN 1000 MCG TABLET    Take 1,000 mcg by mouth daily.     DIPHENHYDRAMINE (BENADRYL) 25 MG CAPSULE    Take 25 mg by mouth at bedtime as needed.     DULOXETINE (CYMBALTA) 30 MG CAPSULE    Take one tablet by mouth daily for the first week and then increase to two tablets by mouth daily.   IBUPROFEN (ADVIL,MOTRIN) 800 MG TABLET    Take 800 mg by mouth 2 (two) times daily as needed.     KETOROLAC (ACULAR) 0.4 % SOLN    One drop right eye four times daily   PANTOPRAZOLE (PROTONIX) 40 MG TABLET    Take 40 mg by mouth daily.  Modified Medications   No medications on file  Discontinued Medications   DULOXETINE (CYMBALTA) 30 MG CAPSULE    30 mg capsule for 1 week then may increase to 60 mg (2 tablets) daily     Physical Exam:  Filed Vitals:   01/06/13 1457  BP: 140/110  Pulse: 79  Temp: 95.9 F (35.5 C)  TempSrc: Oral  Resp: 20  Height: 4' 9.87" (1.47 m)  Weight: 200 lb (90.719 kg)  SpO2: 97%    Physical Exam  Vitals reviewed. Constitutional: She is oriented to person, place, and time and well-developed, well-nourished, and in no distress. No distress.  HENT:  Head: Normocephalic and atraumatic.  Eyes: Conjunctivae and EOM are normal. Pupils are equal, round, and reactive to light.  Neck: Normal range of motion. Neck  supple.  Cardiovascular: Normal rate, regular rhythm and normal heart sounds.   Pulmonary/Chest: Effort normal and breath sounds normal. No respiratory distress.  Abdominal: Soft. Bowel sounds are normal. She exhibits no distension.  Musculoskeletal: Normal range of motion. She exhibits no edema and no tenderness.  paraspinal muscle tenderness of the lumbar region   Neurological: She is alert and oriented to person, place, and time. Gait normal.  Skin: Skin is warm and dry. She is not diaphoretic. No erythema.     Labs reviewed: Basic Metabolic Panel:  Recent Labs  45/40/98 1438  NA 142  K 4.0  CL 106  CO2 23  GLUCOSE 89  BUN 16  CREATININE 0.89  CALCIUM 9.3  TSH 6.050*   Liver Function Tests:  Recent Labs  12/09/12 1438  AST 17  ALT 15  ALKPHOS 116  BILITOT 0.2  PROT 6.5   No results found for this basename: LIPASE, AMYLASE,  in the last 8760 hours No results found for this basename: AMMONIA,  in the last 8760 hours CBC:  Recent Labs  12/09/12 1438  WBC 8.8  NEUTROABS 5.8  HGB 13.8  HCT 41.8  MCV 86  PLT 243    Assessment/Plan 1. HYPERTENSION Elevated here in the office may be related to pain- home readings WNL Will cont current medications and cont to monitor 2. HYPOTHYROIDISM NOS Currently not on medication for thyroid. (per pt she does not need them). Lab Results  Component Value Date   TSH 6.050* 12/09/2012   Will cont to follow TSH.  3. LOW BACK PAIN Worse today. ibuprofen helps but has not resolved pain. May use tylenol 650 q 6 hours as needed. ibuprofen not advised for long term use.  May use heat, icyhot or other topicals, rest to follow up in the office if this does not improve   4. DEPRESSION With anxiety. On cymbalta and tolerating without side effects except for increase drowsiness- instructed to take this at night to help with sleep  Pt to follow up in 3 months for EV with MMSE   Or before if needed

## 2013-04-05 DIAGNOSIS — H02839 Dermatochalasis of unspecified eye, unspecified eyelid: Secondary | ICD-10-CM | POA: Diagnosis not present

## 2013-04-05 DIAGNOSIS — H40039 Anatomical narrow angle, unspecified eye: Secondary | ICD-10-CM | POA: Diagnosis not present

## 2013-04-07 ENCOUNTER — Encounter: Payer: Medicare Other | Admitting: Nurse Practitioner

## 2013-04-14 ENCOUNTER — Ambulatory Visit (INDEPENDENT_AMBULATORY_CARE_PROVIDER_SITE_OTHER): Payer: Medicare Other | Admitting: Nurse Practitioner

## 2013-04-14 VITALS — BP 130/74 | HR 71 | Temp 97.6°F | Resp 14 | Ht 58.5 in | Wt 210.2 lb

## 2013-04-14 DIAGNOSIS — H9193 Unspecified hearing loss, bilateral: Secondary | ICD-10-CM

## 2013-04-14 DIAGNOSIS — H6120 Impacted cerumen, unspecified ear: Secondary | ICD-10-CM

## 2013-04-14 DIAGNOSIS — E039 Hypothyroidism, unspecified: Secondary | ICD-10-CM | POA: Diagnosis not present

## 2013-04-14 DIAGNOSIS — Z23 Encounter for immunization: Secondary | ICD-10-CM | POA: Diagnosis not present

## 2013-04-14 DIAGNOSIS — H9190 Unspecified hearing loss, unspecified ear: Secondary | ICD-10-CM

## 2013-04-14 DIAGNOSIS — G4700 Insomnia, unspecified: Secondary | ICD-10-CM | POA: Diagnosis not present

## 2013-04-14 DIAGNOSIS — H6121 Impacted cerumen, right ear: Secondary | ICD-10-CM

## 2013-04-14 DIAGNOSIS — K219 Gastro-esophageal reflux disease without esophagitis: Secondary | ICD-10-CM

## 2013-04-14 DIAGNOSIS — F411 Generalized anxiety disorder: Secondary | ICD-10-CM

## 2013-04-14 DIAGNOSIS — M545 Low back pain, unspecified: Secondary | ICD-10-CM

## 2013-04-14 MED ORDER — TETANUS-DIPHTHERIA TOXOIDS TD 2-2 LF/0.5ML IM SUSP: 0.5000 mL | Freq: Once | INTRAMUSCULAR | Status: DC

## 2013-04-14 MED ORDER — DULOXETINE HCL 60 MG PO CPEP: ORAL_CAPSULE | ORAL | Status: DC

## 2013-04-14 NOTE — Progress Notes (Signed)
Patient ID: Jody Porter, female   DOB: Sep 10, 1935, 77 y.o.   MRN: [id number]    Allergies  Allergen Reactions  . Ciprofloxacin   . Diazepam   . Penicillins     Chief Complaint  Patient presents with  . Annual Exam    physical with no labs prior.  . Immunizations    declines flu, will print RX for shingles & Tdap.  . other    1) sleepiness all day long, not sleeping more than 4 hours, 2) coughing attacks 4-5 x a day, 3) bladder control with coughing/sneezing and mobilty issues (off balance more)    HPI: Patient is a 77 y.o. female seen in the office today for extended visit but declines this again today -reports she is overwhelmed fatigue and eyes are not staying open but she is staying awake all night -known OSA but is not willing to use cpap (does not feel like being sleepy is related to this) -unsteady with some hearing loss; no dizziness;  ringing in ears has been a chronic problem for may years  - has coughing fits that happen occasionally (uses sugar free cough drops and it improves)  Review of Systems:  Review of Systems  Constitutional: Positive for malaise/fatigue. Negative for fever and chills.  HENT: Positive for hearing loss and tinnitus.   Eyes: Positive for blurred vision.       Appt with eye MD on 11th of this month   Respiratory: Negative for cough and shortness of breath.   Cardiovascular: Negative for chest pain, palpitations and leg swelling.  Gastrointestinal: Negative for heartburn (with protonix), abdominal pain, diarrhea and constipation.  Genitourinary: Negative for dysuria, urgency and frequency.       Stress incontience  Musculoskeletal: Positive for back pain and myalgias.  Skin: Negative.   Neurological: Negative for dizziness, weakness and headaches.  Psychiatric/Behavioral: Negative for depression. The patient is nervous/anxious (daughter notices anxiety) and has insomnia.      Past Medical History  Diagnosis Date  . LBP (low back  pain)   . Depression   . Hypertension   . GERD (gastroesophageal reflux disease)   . Hypothyroid   . Hyperparathyroidism   . Insomnia     w/ sleep apnea NPSG 07/13/2009 AHI 3.5, RDI 29.9/hr  . Allergic rhinitis   . Anxiety    Past Surgical History  Procedure Laterality Date  . Lumbar disc surgery    . Parathyroidectomy    . Cholecystectomy    . Knee surgery Right     reports multiple surgery on knee  . Carpel tunnel Bilateral   . Cesarean section    . Cataracts     Social History:   reports that she has never smoked. She does not have any smokeless tobacco history on file. She reports that she does not drink alcohol or use illicit drugs.  Family History  Problem Relation Age of Onset  . Cancer Mother   . Cancer Father   . Cancer Paternal Grandmother   . Cancer Maternal Grandmother     Medications: Patient's Medications  New Prescriptions   No medications on file  Previous Medications   BIOTIN 1000 MCG TABLET    Take 1,000 mcg by mouth daily.     DIPHENHYDRAMINE (BENADRYL) 25 MG CAPSULE    Take 25 mg by mouth at bedtime as needed.     DULOXETINE (CYMBALTA) 30 MG CAPSULE    Take one tablet by mouth daily for the first week and then  increase to two tablets by mouth daily.   IBUPROFEN (ADVIL,MOTRIN) 800 MG TABLET    Take 800 mg by mouth 2 (two) times daily as needed.     KETOROLAC (ACULAR) 0.4 % SOLN    One drop right eye four times daily   PANTOPRAZOLE (PROTONIX) 40 MG TABLET    Take 40 mg by mouth daily.    Modified Medications   No medications on file  Discontinued Medications   DIPTHERIA-TETANUS TOXOIDS (DECAVAC) 2-2 LF/0.5ML INJECTION    Inject 0.5 mLs into the muscle once.   ZOSTER VACCINE LIVE [place]    Inject 0.5 mLs into the skin.   ZOSTER VACCINE LIVE, PF, (ZOSTAVAX) 16109 UNT/0.65ML INJECTION    Inject 0.65 mLs into the skin once.     Physical Exam:  Filed Vitals:   04/14/13 1343  BP: 130/74  Pulse: 71  Temp: 97.6 F (36.4 C)  TempSrc: Oral  Resp: 14    Height: 4' 10.5" (1.486 m)  Weight: 210 lb 3.2 oz (95.346 kg)  SpO2: 97%    Physical Exam  Vitals reviewed. Constitutional: She is oriented to person, place, and time and well-developed, well-nourished, and in no distress. No distress.  HENT:  Head: Normocephalic and atraumatic.  Mouth/Throat: Oropharynx is clear and moist. No oropharyngeal exudate.  Eyes: Conjunctivae and EOM are normal. Pupils are equal, round, and reactive to light.  Neck: Normal range of motion. Neck supple.  Cardiovascular: Normal rate, regular rhythm and normal heart sounds.   Pulmonary/Chest: Effort normal and breath sounds normal. No respiratory distress.  Abdominal: Soft. Bowel sounds are normal. She exhibits no distension.  Musculoskeletal: Normal range of motion. She exhibits no edema and no tenderness.  Neurological: She is alert and oriented to person, place, and time. Gait normal.  Skin: Skin is warm and dry. She is not diaphoretic. No erythema.     Labs reviewed: Basic Metabolic Panel:  Recent Labs  60/45/40 1438  NA 142  K 4.0  CL 106  CO2 23  GLUCOSE 89  BUN 16  CREATININE 0.89  CALCIUM 9.3  TSH 6.050*   Liver Function Tests:  Recent Labs  12/09/12 1438  AST 17  ALT 15  ALKPHOS 116  BILITOT 0.2  PROT 6.5   No results found for this basename: LIPASE, AMYLASE,  in the last 8760 hours No results found for this basename: AMMONIA,  in the last 8760 hours CBC:  Recent Labs  12/09/12 1438  WBC 8.8  NEUTROABS 5.8  HGB 13.8  HCT 41.8  MCV 86  PLT 243   Lipid Panel: No results found for this basename: CHOL, HDL, LDLCALC, TRIG, CHOLHDL, LDLDIRECT,  in the last 8760 hours TSH:  Recent Labs  12/09/12 1438  TSH 6.050*   A1C: No components found with this basename: A1C,    Assessment/Plan  1. HYPOTHYROIDISM NOS -worsening fatigue; will follow up TSH may need medication at this time due to worsening symptoms   2. Severe obesity (BMI >= 40) -encouraged to eat heart  healthy diet, increase activity and be more active during the day  3. LOW BACK PAIN -recommend tylenol as needed instead of NSAID  4. INSOMNIA WITH SLEEP APNEA UNSPECIFIED -does not wear cpap but is willing to revisit this and try a different mask - Ambulatory referral to Pulmonology - Comprehensive metabolic panel  5. Anxiety state, unspecified - DULoxetine (CYMBALTA) 60 MG capsule; Take one tablet by mouth daily  Dispense: 60 capsule; Refill: 3  6. Hearing  loss, bilateral -with cerumen impaction that has been removed, still would like hearing loss evaluated - Ambulatory referral to Audiology  7. GERD (gastroesophageal reflux disease) -stable on protonix   8. Cerumen impaction, right -removed successfully; pt tolerated well  -will have pt follow up with [facility], DO will not schedule another physical at this time due to the fact pt has not wanted one the past 2 times she has been scheduled

## 2013-04-15 ENCOUNTER — Telehealth: Payer: Self-pay

## 2013-04-15 LAB — COMPREHENSIVE METABOLIC PANEL
ALT: 12 IU/L (ref 0–32)
Albumin/Globulin Ratio: 1.9 (ref 1.1–2.5)
Albumin: 4.3 g/dL (ref 3.5–4.8)
Alkaline Phosphatase: 116 IU/L (ref 39–117)
BUN/Creatinine Ratio: 18 (ref 11–26)
BUN: 14 mg/dL (ref 8–27)
Calcium: 9.3 mg/dL (ref 8.6–10.2)
Chloride: 99 mmol/L (ref 97–108)
Creatinine, Ser: 0.78 mg/dL (ref 0.57–1.00)
GFR calc Af Amer: 86 mL/min/{1.73_m2} (ref >59)
GFR calc non Af Amer: 75 mL/min/{1.73_m2} (ref >59)
Globulin, Total: 2.3 g/dL (ref 1.5–4.5)
Glucose: 110 mg/dL — ABNORMAL HIGH (ref 65–99)
Total Protein: 6.6 g/dL (ref 6.0–8.5)

## 2013-04-15 LAB — TSH: TSH: 8.01 u[IU]/mL — ABNORMAL HIGH (ref 0.450–4.500)

## 2013-04-15 MED ORDER — LEVOTHYROXINE SODIUM 25 MCG PO TABS: ORAL_TABLET | ORAL | Status: DC

## 2013-04-15 NOTE — Telephone Encounter (Signed)
Message copied by Maurice Small on Thu Apr 15, 2013 11:27 AM ------      Message from: Claudie Revering      Created: Thu Apr 15, 2013  8:47 AM       TSH is more elevated; this could be the cause of fatigue along with sleep apnea- will start synthroid at 25 mcg every morning with full glass of water 1 hour before eating or drinking anything else; please fax to pharm ------

## 2013-04-15 NOTE — Telephone Encounter (Signed)
Spoke with patient, patient verbalized understanding of lab results. Patient requested rx be sent to [facility]. Patient with pending appointment to seen in January by Dr.Reed

## 2013-04-20 ENCOUNTER — Ambulatory Visit (INDEPENDENT_AMBULATORY_CARE_PROVIDER_SITE_OTHER): Payer: Medicare Other | Admitting: Internal Medicine

## 2013-04-20 ENCOUNTER — Ambulatory Visit (INDEPENDENT_AMBULATORY_CARE_PROVIDER_SITE_OTHER)
Admission: RE | Admit: 2013-04-20 | Discharge: 2013-04-20 | Disposition: A | Discharge status: Home / Self Care | Payer: Medicare Other | Source: Ambulatory Visit | Attending: Internal Medicine | Admitting: Internal Medicine

## 2013-04-20 ENCOUNTER — Encounter: Payer: Self-pay | Admitting: Internal Medicine

## 2013-04-20 VITALS — BP 122/84 | HR 77 | Ht <= 58 in | Wt 213.0 lb

## 2013-04-20 DIAGNOSIS — R05 Cough: Secondary | ICD-10-CM | POA: Diagnosis not present

## 2013-04-20 DIAGNOSIS — R053 Chronic cough: Secondary | ICD-10-CM

## 2013-04-20 DIAGNOSIS — G4700 Insomnia, unspecified: Secondary | ICD-10-CM

## 2013-04-20 DIAGNOSIS — R059 Cough, unspecified: Secondary | ICD-10-CM

## 2013-04-20 MED ORDER — CLONAZEPAM 0.5 MG PO TABS: ORAL_TABLET | ORAL | Status: DC

## 2013-04-20 NOTE — Patient Instructions (Signed)
Order- CXR   Dx chronic cough  Sample Nuvigil 150 mg   1 each morning as needed for alertness  Script for clonazepam 0.5 mg,  1-2 taken about 1/2 hour before bedtime to help sleep more soundly

## 2013-04-20 NOTE — Progress Notes (Signed)
HPI  11/09/10- 77 yo F followed for Obstructive Sleep Apnea  Last here August 18, 2009 NPSG from 07/13/09 showed AHI 3.5/hr, RDI 29.9/ hr. Tried CPAP for RDI but panicked- too claustrophobic and won't try again.  Feels exhausted. Fighting daytime "exhaustion/ tiredness" x 2 years. She asks if it could be an allergy to sugar. Awake off and on at night for long periods. Unusal that she slept 5 hours once. Ever since childhood has never slept much at one time. Caffeine little effect. Has Remus Loffler she has never tried for fear it would be unsafe with sleep apnea. Is on thyroid replacement with no benefit. We discussed meaning of "exhaustion' and that it may not be a sleep issue.  04/20/13- 60 yo F never smoker followed for OSA/ failed CPAP, cough,  FOLLOWS FOR: last seen 2012; has not been able to use CPAP-needs other options. Pt states she has never really slept well. Pt states she has developed a cough in the past 6-7 months.  Admits chronic sleepy, better since parathyroidectomy. Better after avoiding all grains in her diet. First she said exhaustion is gone, next she said she can't keep her eyes open. She feels she wakes every hour or so during the night with no sense of deep sleep and no dream recall.says she has slept this way since age 17 and her whole family sleeps this way. Now here for complaint of dry cough x 8 months.Easy DOE, does not think it is allergy or a cold. Treated for GERD. Denies postnasal drip or history of asthma or pneumonia.  ROS-see HPI Constitutional:   No-   weight loss, night sweats, fevers, chills, +fatigue, lassitude. HEENT:   No-  headaches, difficulty swallowing, tooth/dental problems, sore throat,       No-  sneezing, itching, ear ache, nasal congestion, post nasal drip,  CV:  No-   chest pain, orthopnea, PND, swelling in lower extremities, anasarca, dizziness, palpitations Resp: No-   shortness of breath with exertion or at rest.              No-   productive cough,  +  non-productive cough,  No- coughing up of blood.              No-   change in color of mucus.  No- wheezing.   Skin: No-   rash or lesions. GI:  No-   heartburn, indigestion, abdominal pain, nausea, vomiting,  GU:  MS:  No-   joint pain or swelling.   Neuro-     nothing unusual Psych:  No- change in mood or affect. No depression or anxiety.  No memory loss.  OBJ- Physical Exam General- Alert, Oriented, Affect-+pressured speech, Distress- none acute, overweight Skin- rash-none, lesions- none, excoriation- none Lymphadenopathy- none Head- atraumatic            Eyes- Gross vision intact, PERRLA, conjunctivae and secretions clear            Ears- Hearing, canals-normal            Nose- Clear, no-Septal dev, mucus, polyps, erosion, perforation             Throat- Mallampati II , mucosa clear , drainage- none, tonsils- atrophic Neck- flexible , trachea midline, no stridor , thyroid nl, carotid no bruit Chest - symmetrical excursion , unlabored           Heart/CV- RRR , no murmur , no gallop  , no rub, nl s1 s2                           -  JVD- none , edema- none, stasis changes- none, varices- none           Lung- +few crackles, wheeze- none, cough- none , dullness-none, rub- none           Chest wall-  Abd-  Br/ Gen/ Rectal- Not done, not indicated Extrem- cyanosis- none, clubbing, none, atrophy- none, strength- nl Neuro- grossly intact to observation

## 2013-04-22 ENCOUNTER — Encounter (INDEPENDENT_AMBULATORY_CARE_PROVIDER_SITE_OTHER): Payer: Self-pay | Admitting: Ophthalmology

## 2013-04-22 ENCOUNTER — Encounter (INDEPENDENT_AMBULATORY_CARE_PROVIDER_SITE_OTHER): Payer: Medicare Other | Admitting: Ophthalmology

## 2013-04-22 DIAGNOSIS — H3530 Unspecified macular degeneration: Secondary | ICD-10-CM | POA: Diagnosis not present

## 2013-04-22 DIAGNOSIS — I10 Essential (primary) hypertension: Secondary | ICD-10-CM | POA: Diagnosis not present

## 2013-04-22 DIAGNOSIS — H43819 Vitreous degeneration, unspecified eye: Secondary | ICD-10-CM

## 2013-04-22 DIAGNOSIS — H35039 Hypertensive retinopathy, unspecified eye: Secondary | ICD-10-CM | POA: Diagnosis not present

## 2013-04-22 DIAGNOSIS — H35359 Cystoid macular degeneration, unspecified eye: Secondary | ICD-10-CM | POA: Diagnosis not present

## 2013-04-22 NOTE — Progress Notes (Signed)
Quick Note:  LMTCB ______ 

## 2013-04-26 ENCOUNTER — Telehealth: Payer: Self-pay | Admitting: Internal Medicine

## 2013-04-26 MED ORDER — ARMODAFINIL 150 MG PO TABS: ORAL_TABLET | ORAL | Status: DC

## 2013-04-26 NOTE — Telephone Encounter (Signed)
Last OV 04-20-13: Sample Nuvigil 150 mg 1 each morning as needed for alertness Pt states that Nuvugil is working great for her and she wants rx sent. Rx sent. Carron Curie, CMA

## 2013-04-29 DIAGNOSIS — H9319 Tinnitus, unspecified ear: Secondary | ICD-10-CM | POA: Diagnosis not present

## 2013-04-29 DIAGNOSIS — R42 Dizziness and giddiness: Secondary | ICD-10-CM | POA: Diagnosis not present

## 2013-04-29 DIAGNOSIS — H903 Sensorineural hearing loss, bilateral: Secondary | ICD-10-CM | POA: Diagnosis not present

## 2013-05-03 ENCOUNTER — Telehealth: Payer: Self-pay | Admitting: Internal Medicine

## 2013-05-03 NOTE — Telephone Encounter (Signed)
Spoke with pt. She has not been able to get her nuvigil. I advised will call the pharm to see what is going on. I called [facility] and was advised it needs PA. PHONE #: [phone] ID#: [id number] I called # and was advised I was giving the number for the pharmacy and needed to call [facility] back for correct phone #. Called [facility] back and was giving # [phone] I called and initiated PA. This was approved from 05/03/13-05/03/14. Approval #: [id number] I called and made pt aware and [facility] is aware as well. Nothing further needed

## 2013-05-05 NOTE — Progress Notes (Signed)
Quick Note:  Pt aware of results. ______ 

## 2013-05-12 DIAGNOSIS — R059 Cough, unspecified: Secondary | ICD-10-CM | POA: Insufficient documentation

## 2013-05-12 DIAGNOSIS — R05 Cough: Secondary | ICD-10-CM | POA: Insufficient documentation

## 2013-05-12 NOTE — Assessment & Plan Note (Signed)
Nonspecific cough without obvious triggers. Plan-chest x-ray

## 2013-05-12 NOTE — Assessment & Plan Note (Signed)
Primary complaint is insomnia with history of markedly fragmented sleep since childhood. Combined with daytime tiredness, consider primary disorder of excessive daytime sleepiness. I don't get a history of cataplexy or sleep paralysis Plan-try Nuvigil samples during the day to stabilize wakefulness and clonazepam at night to stabilize sleep. Consider MSLT

## 2013-05-21 DIAGNOSIS — H2510 Age-related nuclear cataract, unspecified eye: Secondary | ICD-10-CM | POA: Diagnosis not present

## 2013-06-01 ENCOUNTER — Encounter: Payer: Self-pay | Admitting: Internal Medicine

## 2013-06-01 ENCOUNTER — Ambulatory Visit (INDEPENDENT_AMBULATORY_CARE_PROVIDER_SITE_OTHER)
Admission: RE | Admit: 2013-06-01 | Discharge: 2013-06-01 | Disposition: A | Discharge status: Home / Self Care | Payer: Medicare Other | Source: Ambulatory Visit | Attending: Internal Medicine | Admitting: Internal Medicine

## 2013-06-01 ENCOUNTER — Ambulatory Visit (INDEPENDENT_AMBULATORY_CARE_PROVIDER_SITE_OTHER): Payer: Medicare Other | Admitting: Internal Medicine

## 2013-06-01 VITALS — BP 138/92 | HR 91 | Ht <= 58 in | Wt 217.0 lb

## 2013-06-01 DIAGNOSIS — G4700 Insomnia, unspecified: Secondary | ICD-10-CM | POA: Diagnosis not present

## 2013-06-01 DIAGNOSIS — J40 Bronchitis, not specified as acute or chronic: Secondary | ICD-10-CM

## 2013-06-01 DIAGNOSIS — R059 Cough, unspecified: Secondary | ICD-10-CM

## 2013-06-01 DIAGNOSIS — G4730 Sleep apnea, unspecified: Secondary | ICD-10-CM

## 2013-06-01 DIAGNOSIS — R05 Cough: Secondary | ICD-10-CM

## 2013-06-01 DIAGNOSIS — K219 Gastro-esophageal reflux disease without esophagitis: Secondary | ICD-10-CM

## 2013-06-01 DIAGNOSIS — R0989 Other specified symptoms and signs involving the circulatory and respiratory systems: Secondary | ICD-10-CM | POA: Diagnosis not present

## 2013-06-01 MED ORDER — ARMODAFINIL 150 MG PO TABS: ORAL_TABLET | ORAL | Status: DC

## 2013-06-01 NOTE — Patient Instructions (Signed)
Order- CXR dx bronchitis  Script to refill Nuvigil  Watch for reflux and possibly aspiration of stomach juice at night.

## 2013-06-01 NOTE — Progress Notes (Signed)
HPI  11/09/10- 78 yo F followed for Obstructive Sleep Apnea  Last here August 18, 2009 NPSG from 07/13/09 showed AHI 3.5/hr, RDI 29.9/ hr. Tried CPAP for RDI but panicked- too claustrophobic and won't try again.  Feels exhausted. Fighting daytime "exhaustion/ tiredness" x 2 years. She asks if it could be an allergy to sugar. Awake off and on at night for long periods. Unusal that she slept 5 hours once. Ever since childhood has never slept much at one time. Caffeine little effect. Has Remus Loffler she has never tried for fear it would be unsafe with sleep apnea. Is on thyroid replacement with no benefit. We discussed meaning of "exhaustion' and that it may not be a sleep issue.  04/20/13- 26 yo F never smoker followed for Insomnia w/ OSA/ failed CPAP, cough,  FOLLOWS FOR: last seen 2012; has not been able to use CPAP-needs other options. Pt states she has never really slept well. Pt states she has developed a cough in the past 6-7 months.  Admits chronic sleepy, better since parathyroidectomy. Better after avoiding all grains in her diet. First she said exhaustion is gone, next she said she can't keep her eyes open. She feels she wakes every hour or so during the night with no sense of deep sleep and no dream recall.says she has slept this way since age 80 and her whole family sleeps this way. Now here for complaint of dry cough x 8 months.Easy DOE, does not think it is allergy or a cold. Treated for GERD. Denies postnasal drip or history of asthma or pneumonia.  06/01/13- 89 yo F never smoker followed for Cough, hx OSA (failed CPAP),  FOLLOWS FOR: Sleeping has improved since starting Nuvigil. Not using CPAP due to claustrophobia. We had given samples Nuvigil for alertness in day, clonazepam for sleep consolidation at night.  No prior use of stimulant medications like Adderall Ritalin. Clonazepam for sleep med magnified claustrophobia. Cough-still coughing without change, dry. Heart burn controlled. Denies recent  respiratory infection. Sleeps with head of bed elevated. Prednisone for an eye problem caused confusion and poor balance. CXR 04/20/13 IMPRESSION:  Lower lung volumes. Increased left-greater-than-right basilar  heterogeneous opacities likely represents atelectasis and  bronchovascular crowding. Infection not excluded.  Electronically Signed  By: Annia Belt M.D.  On: 04/20/2013 16:55  ROS-see HPI Constitutional:   No-   weight loss, night sweats, fevers, chills, +fatigue, lassitude. HEENT:   No-  headaches, difficulty swallowing, tooth/dental problems, sore throat,       No-  sneezing, itching, ear ache, nasal congestion, post nasal drip,  CV:  No-   chest pain, orthopnea, PND, swelling in lower extremities, anasarca, dizziness, palpitations Resp: No-   shortness of breath with exertion or at rest.              No-   productive cough,  + non-productive cough,  No- coughing up of blood.              No-   change in color of mucus.  No- wheezing.   Skin: No-   rash or lesions. GI:  No-   heartburn, indigestion, abdominal pain, nausea, vomiting,  GU:  MS:  No-   joint pain or swelling.   Neuro-     nothing unusual Psych:  No- change in mood or affect. No depression or anxiety.  No memory loss.  OBJ- Physical Exam General- Alert, Oriented, Affect-+talkative, Distress- none acute, overweight Skin- rash-none, lesions- none, excoriation- none Lymphadenopathy- none Head-  atraumatic            Eyes- Gross vision intact, PERRLA, conjunctivae and secretions clear            Ears- Hearing, canals-normal            Nose- Clear, no-Septal dev, mucus, polyps, erosion, perforation             Throat- Mallampati II , mucosa clear , drainage- none, tonsils- atrophic Neck- flexible , trachea midline, no stridor , thyroid nl, carotid no bruit Chest - symmetrical excursion , unlabored           Heart/CV- RRR , no murmur , no gallop  , no rub, nl s1 s2                           - JVD- none , edema-  none, stasis changes- none, varices- none           Lung- +few crackles, wheeze- none, cough- none , dullness-none, rub- none           Chest wall-  Abd-  Br/ Gen/ Rectal- Not done, not indicated Extrem- cyanosis- none, clubbing, none, atrophy- none, strength- nl Neuro- grossly intact to observation

## 2013-06-03 ENCOUNTER — Encounter: Payer: Self-pay | Admitting: Internal Medicine

## 2013-06-03 ENCOUNTER — Ambulatory Visit (INDEPENDENT_AMBULATORY_CARE_PROVIDER_SITE_OTHER): Payer: Medicare Other | Admitting: Internal Medicine

## 2013-06-03 VITALS — BP 148/82 | HR 78 | Temp 96.9°F | Resp 14 | Ht <= 58 in | Wt 213.6 lb

## 2013-06-03 DIAGNOSIS — G4730 Sleep apnea, unspecified: Secondary | ICD-10-CM

## 2013-06-03 DIAGNOSIS — E034 Atrophy of thyroid (acquired): Secondary | ICD-10-CM

## 2013-06-03 DIAGNOSIS — I10 Essential (primary) hypertension: Secondary | ICD-10-CM

## 2013-06-03 DIAGNOSIS — G4700 Insomnia, unspecified: Secondary | ICD-10-CM

## 2013-06-03 DIAGNOSIS — F329 Major depressive disorder, single episode, unspecified: Secondary | ICD-10-CM

## 2013-06-03 DIAGNOSIS — E0789 Other specified disorders of thyroid: Secondary | ICD-10-CM

## 2013-06-03 DIAGNOSIS — R7309 Other abnormal glucose: Secondary | ICD-10-CM

## 2013-06-03 DIAGNOSIS — F3289 Other specified depressive episodes: Secondary | ICD-10-CM | POA: Diagnosis not present

## 2013-06-03 DIAGNOSIS — R739 Hyperglycemia, unspecified: Secondary | ICD-10-CM

## 2013-06-03 MED ORDER — SERTRALINE HCL 50 MG PO TABS: 50.0000 mg | ORAL_TABLET | Freq: Every day | ORAL | Status: DC

## 2013-06-03 MED ORDER — SERTRALINE HCL 25 MG PO TABS: 25.0000 mg | ORAL_TABLET | Freq: Every day | ORAL | Status: DC

## 2013-06-03 NOTE — Progress Notes (Signed)
Patient ID: Jody Porter, female   DOB: 07/04/1935, 78 y.o.   MRN: [id number]   Location:  [facility]   Allergies  Allergen Reactions  . Ciprofloxacin   . Diazepam   . Penicillins   . Prednisone     Marked mentation/ mood change    Chief Complaint  Patient presents with  . Medical Managment of Chronic Issues    Complains of roaring in head and unstable mobility    HPI: Patient is a 78 y.o. female seen in the office today for medical mgt of chronic diseases and roaring in her head with instability walking.  Thinks have been bad.  Gets up in the morning and takes her until 11-12 to feel half descent.  Has a roaring in her head, and has difficulty walking around especially turning.  Easily drops things.  Has stopped most of her medicines.  Is now on nuvigil.  Had a serious reaction to prednisone when given to bring down swelling of her eye.  Lost three weeks of her life.  Has gained almost 20 lbs since July.  Quit eating grain at that time.  For 4.5 years felt incredibly exhausted--got worse and worse.  Has son in law with celiac disease.  Feeling of exhaustion went away after 2 days.  Has reintroduced oats and some rice.  Says she cannot focus and pay attention to drive a car.  Went slowly to drive here.    Balance will go all of a sudden.  Is NOT dizzy.  Does have peripheral neuropathy.    Son in law has brain cancer now.  He has had salivary cancer 2 mos ago.    Has taken protonix since 2001 for acid reflux.  Quit taking it two days ago.    Uses benadryl for depression--took two this am and two ibuprofen.  Will take benadryl for nerves, nausea.  Discussed that benadryl may be affecting her walking.    Nuvigil is actually helping her sleep.  Says nobody in her family sleeps well.    Hyperparathyroidism:  Had pain everywhere.  Had difficulty thinking.  Did have parathyroidectomy 3 years ago.    Lives here for 47 years, but is from [place].   Is Jehovah's witness.    Left eye vision is good.  Right eye treated with abx, steroids, shots, surgery which finally did help, but still inflamed.    Cymbalta did not have any effect whatsoever on her mood.    Talked about nasal cpap as an option for her sleep apnea since she is claustrophobia.    Is working on her diet to try to lose weight.  Says she does have an addiction to [employer] milk shakes.  Grazes when she eats.  Admits she doesn't do a lot of exercise.  Is afraid of falling if she walks outside.  Due to prior knee surgery, needs something to pull herself up.  Has treadmill and rowing machine.  Is up to 10 mins per day.    Review of Systems:  Review of Systems  Constitutional: Positive for malaise/fatigue. Negative for fever and weight loss.  HENT:       Roaring in head  Eyes: Negative for blurred vision.  Respiratory: Negative for shortness of breath.        OSA  Cardiovascular: Negative for chest pain.  Gastrointestinal: Positive for heartburn.  Genitourinary: Negative for dysuria.  Musculoskeletal: Positive for joint pain.  Skin: Negative for rash.  Neurological: Positive for weakness. Negative for headaches.       Unsteady gait  Psychiatric/Behavioral: Positive for depression.    Past Medical History  Diagnosis Date  . LBP (low back pain)   . Depression   . Hypertension   . GERD (gastroesophageal reflux disease)   . Hypothyroid   . Hyperparathyroidism   . Insomnia     w/ sleep apnea NPSG 07/13/2009 AHI 3.5, RDI 29.9/hr  . Allergic rhinitis   . Anxiety   . Celiac disease     Past Surgical History  Procedure Laterality Date  . Lumbar disc surgery    . Parathyroidectomy    . Cholecystectomy    . Knee surgery Right     reports multiple surgery on knee  . Carpel tunnel Bilateral   . Cesarean section    . Cataracts      Social History:   reports that she has never smoked. She does not have any smokeless tobacco history on file. She reports that she  does not drink alcohol or use illicit drugs.  Family History  Problem Relation Age of Onset  . Cancer Mother   . Cancer Father   . Cancer Paternal Grandmother   . Cancer Maternal Grandmother     Medications: Patient's Medications  New Prescriptions   No medications on file  Previous Medications   ARMODAFINIL (NUVIGIL) 150 MG TABLET    Take 150 mg by mouth daily.   BIOTIN 1000 MCG TABLET    Take 1,000 mcg by mouth daily.     DIPHENHYDRAMINE (BENADRYL) 25 MG CAPSULE    Take 25 mg by mouth at bedtime as needed.    KETOROLAC (ACULAR) 0.4 % SOLN    One drop right eye four times daily   LEVOTHYROXINE (SYNTHROID, LEVOTHROID) 25 MCG TABLET    1 by mouth every morning with full glass of water 1 hour before eating or drinking anything else   PANTOPRAZOLE (PROTONIX) 40 MG TABLET    Take 40 mg by mouth daily.    Modified Medications   Modified Medication Previous Medication   ARMODAFINIL (NUVIGIL PO) Armodafinil (NUVIGIL PO)      Take by mouth. Take one tablet once daily in the morning for alertness    Take by mouth. Take one tablet once daily in the morning for alertness  Discontinued Medications   ARMODAFINIL (NUVIGIL) 150 MG TABLET    Take one each morning as needed for alertness     Physical Exam: Filed Vitals:   06/03/13 1320  BP: 148/82  Pulse: 78  Temp: 96.9 F (36.1 C)  TempSrc: Oral  Resp: 14  Height: 4\' 10"  (1.473 m)  Weight: 213 lb 9.6 oz (96.888 kg)  Physical Exam  Constitutional: She is oriented to person, place, and time.  Obese female  HENT:  Head: Normocephalic and atraumatic.  Eyes: EOM are normal. Pupils are equal, round, and reactive to light.  Neck: Neck supple. No JVD present.  Cardiovascular: Normal rate, regular rhythm, normal heart sounds and intact distal pulses.   Pulmonary/Chest: Effort normal and breath sounds normal. No respiratory distress.  Abdominal: Soft. Bowel sounds are normal. She exhibits no distension and no mass. There is no tenderness.    Musculoskeletal: Normal range of motion. She exhibits no edema and no tenderness.  Gait appears steady  Neurological: She is alert and oriented to person, place, and time. No cranial nerve deficit. Coordination normal.  Skin: Skin is warm and dry.  Psychiatric: She has a  normal mood and affect.  Tangential    Labs reviewed: Basic Metabolic Panel:  Recent Labs  82/95/62 1438 04/14/13 1506  NA 142 140  K 4.0 4.1  CL 106 99  CO2 23 22  GLUCOSE 89 110*  BUN 16 14  CREATININE 0.89 0.78  CALCIUM 9.3 9.3  TSH 6.050* 8.010*   Liver Function Tests:  Recent Labs  12/09/12 1438 04/14/13 1506  AST 17 15  ALT 15 12  ALKPHOS 116 116  BILITOT 0.2 <0.2  PROT 6.5 6.6  CBC:  Recent Labs  12/09/12 1438  WBC 8.8  NEUTROABS 5.8  HGB 13.8  HCT 41.8  MCV 86  PLT 243   Lab Results  Component Value Date   HGBA1C 6.4* 12/09/2012  Assessment/Plan 1. Depressive disorder, not elsewhere classified -seems to be the overlying cause of her various unusual symptoms -will treat with zoloft for this and see if it is helpful with some of her somatic symptoms, as well as her mood -says cymbalta did not help in the past  2. Severe obesity (BMI >= 40) -encouraged exercise for mood and weight loss, healthy balanced diet - Lipid panel; Future - CBC with Differential; Future  3. Hyperglycemia - noted on prior labs -will r/o diabetes with labs: - Hemoglobin A1c; Future - Comprehensive metabolic panel; Future  4. Hypothyroidism -not on treatment at present, but will assess for this - TSH; Future  5. Insomnia with sleep apnea, unspecified -says nuvigil helps her sleep -needs to get nasal cpap to help with this -avoid benadryl use as this will make gait unsteady and increase confusion, fall risk  6. Essential hypertension, benign -bp satisfactory for her age with goal <150/90  Labs/tests ordered: Orders Placed This Encounter  Procedures  . Hemoglobin A1c    Standing Status:  Future     Number of Occurrences:      Standing Expiration Date: 12/01/2013  . Lipid panel    Standing Status: Future     Number of Occurrences:      Standing Expiration Date: 12/01/2013    Order Specific Question:  Has the patient fasted?    Answer:  Yes  . CBC with Differential    Standing Status: Future     Number of Occurrences:      Standing Expiration Date: 12/01/2013  . Comprehensive metabolic panel    Standing Status: Future     Number of Occurrences:      Standing Expiration Date: 12/01/2013    Order Specific Question:  Has the patient fasted?    Answer:  Yes  . TSH    Standing Status: Future     Number of Occurrences:      Standing Expiration Date: 12/01/2013    Next appt: 3 mos

## 2013-06-03 NOTE — Patient Instructions (Addendum)
TRY TO STOP USING THE BENADRYL IN THE DAYTIME.  IT IS PROBABLY AFFECTING YOUR WALKING/MOBILITY.    LET'S TRY A NEW ANTIDEPRESSANT AND SEE IF IT HELPS YOUR MOOD AND SLEEP.    Diabetes and Exercise Exercising regularly is important. It is not just about losing weight. It has many health benefits, such as:  Improving your overall fitness, flexibility, and endurance.  Increasing your bone density.  Helping with weight control.  Decreasing your body fat.  Increasing your muscle strength.  Reducing stress and tension.  Improving your overall health. People with diabetes who exercise gain additional benefits because exercise:  Reduces appetite.  Improves the body's use of blood sugar (glucose).  Helps lower or control blood glucose.  Decreases blood pressure.  Helps control blood lipids (such as cholesterol and triglycerides).  Improves the body's use of the hormone insulin by:  Increasing the body's insulin sensitivity.  Reducing the body's insulin needs.  Decreases the risk for heart disease because exercising:  Lowers cholesterol and triglycerides levels.  Increases the levels of good cholesterol (such as high-density lipoproteins [HDL]) in the body.  Lowers blood glucose levels. YOUR ACTIVITY PLAN  Choose an activity that you enjoy and set realistic goals. Your health care provider or diabetes educator can help you make an activity plan that works for you. You can break activities into 2 or 3 sessions throughout the day. Doing so is as good as one long session. Exercise ideas include:  Taking the dog for a walk.  Taking the stairs instead of the elevator.  Dancing to your favorite song.  Doing your favorite exercise with a friend. RECOMMENDATIONS FOR EXERCISING WITH TYPE 1 OR TYPE 2 DIABETES   Check your blood glucose before exercising. If blood glucose levels are greater than 240 mg/dL, check for urine ketones. Do not exercise if ketones are present.  Avoid  injecting insulin into areas of the body that are going to be exercised. For example, avoid injecting insulin into:  The arms when playing tennis.  The legs when jogging.  Keep a record of:  Food intake before and after you exercise.  Expected peak times of insulin action.  Blood glucose levels before and after you exercise.  The type and amount of exercise you have done.  Review your records with your health care provider. Your health care provider will help you to develop guidelines for adjusting food intake and insulin amounts before and after exercising.  If you take insulin or oral hypoglycemic agents, watch for signs and symptoms of hypoglycemia. They include:  Dizziness.  Shaking.  Sweating.  Chills.  Confusion.  Drink plenty of water while you exercise to prevent dehydration or heat stroke. Body water is lost during exercise and must be replaced.  Talk to your health care provider before starting an exercise program to make sure it is safe for you. Remember, almost any type of activity is better than none. Document Released: 07/20/2003 Document Revised: 12/30/2012 Document Reviewed: 10/06/2012 [facility] Patient Information 2014 [place], [place]. Diabetes Meal Planning Guide The diabetes meal planning guide is a tool to help you plan your meals and snacks. It is important for people with diabetes to manage their blood glucose (sugar) levels. Choosing the right foods and the right amounts throughout your day will help control your blood glucose. Eating right can even help you improve your blood pressure and reach or maintain a healthy weight. CARBOHYDRATE COUNTING MADE EASY When you eat carbohydrates, they turn to sugar. This raises your  blood glucose level. Counting carbohydrates can help you control this level so you feel better. When you plan your meals by counting carbohydrates, you can have more flexibility in what you eat and balance your medicine with your food  intake. Carbohydrate counting simply means adding up the total amount of carbohydrate grams in your meals and snacks. Try to eat about the same amount at each meal. Foods with carbohydrates are listed below. Each portion below is 1 carbohydrate serving or 15 grams of carbohydrates. Ask your dietician how many grams of carbohydrates you should eat at each meal or snack. Grains and Starches  1 slice bread.   English muffin or hotdog/hamburger bun.   cup cold cereal (unsweetened).   cup cooked pasta or rice.   cup starchy vegetables (corn, potatoes, peas, beans, winter squash).  1 tortilla (6 inches).   bagel.  1 waffle or pancake (size of a CD).   cup cooked cereal.  4 to 6 small crackers. *Whole grain is recommended. Fruit  1 cup fresh unsweetened berries, melon, papaya, pineapple.  1 small fresh fruit.   banana or mango.   cup fruit juice (4 oz unsweetened).   cup canned fruit in natural juice or water.  2 tbs dried fruit.  12 to 15 grapes or cherries. Milk and Yogurt  1 cup fat-free or 1% milk.  1 cup soy milk.  6 oz light yogurt with sugar-free sweetener.  6 oz low-fat soy yogurt.  6 oz plain yogurt. Vegetables  1 cup raw or  cup cooked is counted as 0 carbohydrates or a "free" food.  If you eat 3 or more servings at 1 meal, count them as 1 carbohydrate serving. Other Carbohydrates   oz chips or pretzels.   cup ice cream or frozen yogurt.   cup sherbet or sorbet.  2 inch square cake, no frosting.  1 tbs honey, sugar, jam, jelly, or syrup.  2 small cookies.  3 squares of graham crackers.  3 cups popcorn.  6 crackers.  1 cup broth-based soup.  Count 1 cup casserole or other mixed foods as 2 carbohydrate servings.  Foods with less than 20 calories in a serving may be counted as 0 carbohydrates or a "free" food. You may want to purchase a book or computer software that lists the carbohydrate gram counts of different foods. In  addition, the nutrition facts panel on the labels of the foods you eat are a good source of this information. The label will tell you how big the serving size is and the total number of carbohydrate grams you will be eating per serving. Divide this number by 15 to obtain the number of carbohydrate servings in a portion. Remember, 1 carbohydrate serving equals 15 grams of carbohydrate. SERVING SIZES Measuring foods and serving sizes helps you make sure you are getting the right amount of food. The list below tells how big or small some common serving sizes are.  1 oz.........4 stacked dice.  3 oz........Marland KitchenDeck of cards.  1 tsp.......Marland KitchenTip of little finger.  1 tbs......Marland KitchenMarland KitchenThumb.  2 tbs.......Marland KitchenGolf ball.   cup......Marland KitchenHalf of a fist.  1 cup.......Marland KitchenA fist. SAMPLE DIABETES MEAL PLAN Below is a sample meal plan that includes foods from the grain and starches, dairy, vegetable, fruit, and meat groups. A dietician can individualize a meal plan to fit your calorie needs and tell you the number of servings needed from each food group. However, controlling the total amount of carbohydrates in your meal or snack is more  important than making sure you include all of the food groups at every meal. You may interchange carbohydrate containing foods (dairy, starches, and fruits). The meal plan below is an example of a 2000 calorie diet using carbohydrate counting. This meal plan has 17 carbohydrate servings. Breakfast  1 cup oatmeal (2 carb servings).   cup light yogurt (1 carb serving).  1 cup blueberries (1 carb serving).   cup almonds. Snack  1 large apple (2 carb servings).  1 low-fat string cheese stick. Lunch  Chicken breast salad.  1 cup spinach.   cup chopped tomatoes.  2 oz chicken breast, sliced.  2 tbs low-fat [place] dressing.  12 whole-wheat crackers (2 carb servings).  12 to 15 grapes (1 carb serving).  1 cup low-fat milk (1 carb serving). Snack  1 cup carrots.    cup hummus (1 carb serving). Dinner  3 oz broiled salmon.  1 cup brown rice (3 carb servings). Snack  1  cups steamed broccoli (1 carb serving) drizzled with 1 tsp olive oil and lemon juice.  1 cup light pudding (2 carb servings). DIABETES MEAL PLANNING WORKSHEET Your dietician can use this worksheet to help you decide how many servings of foods and what types of foods are right for you.  BREAKFAST Food Group and Servings / Carb Servings Grain/Starches __________________________________ Dairy __________________________________________ Vegetable ______________________________________ Fruit ___________________________________________ Meat __________________________________________ Fat ____________________________________________ LUNCH Food Group and Servings / Carb Servings Grain/Starches ___________________________________ Dairy ___________________________________________ Fruit ____________________________________________ Meat ___________________________________________ Fat _____________________________________________ Laural GoldenINNER Food Group and Servings / Carb Servings Grain/Starches ___________________________________ Dairy ___________________________________________ Fruit ____________________________________________ Meat ___________________________________________ Fat _____________________________________________ SNACKS Food Group and Servings / Carb Servings Grain/Starches ___________________________________ Dairy ___________________________________________ Vegetable _______________________________________ Fruit ____________________________________________ Meat ___________________________________________ Fat _____________________________________________ DAILY TOTALS Starches _________________________ Vegetable ________________________ Fruit ____________________________ Dairy ____________________________ Meat ____________________________ Fat  ______________________________ Document Released: 01/24/2005 Document Revised: 07/22/2011 Document Reviewed: 12/05/2008 [employer] Patient Information 2014 [employer].

## 2013-06-07 ENCOUNTER — Encounter (INDEPENDENT_AMBULATORY_CARE_PROVIDER_SITE_OTHER): Payer: Medicare Other | Admitting: Ophthalmology

## 2013-06-07 DIAGNOSIS — H35359 Cystoid macular degeneration, unspecified eye: Secondary | ICD-10-CM

## 2013-06-07 DIAGNOSIS — I10 Essential (primary) hypertension: Secondary | ICD-10-CM | POA: Diagnosis not present

## 2013-06-07 DIAGNOSIS — H3530 Unspecified macular degeneration: Secondary | ICD-10-CM | POA: Diagnosis not present

## 2013-06-07 DIAGNOSIS — H35039 Hypertensive retinopathy, unspecified eye: Secondary | ICD-10-CM | POA: Diagnosis not present

## 2013-06-07 DIAGNOSIS — H43819 Vitreous degeneration, unspecified eye: Secondary | ICD-10-CM

## 2013-06-15 ENCOUNTER — Other Ambulatory Visit: Payer: Self-pay | Admitting: Nurse Practitioner

## 2013-06-27 NOTE — Assessment & Plan Note (Signed)
Nonspecific cough with possibility of reflux Plan-chest x-ray, watch for aspiration

## 2013-06-27 NOTE — Assessment & Plan Note (Signed)
Plan-reflux precautions reinforced

## 2013-06-27 NOTE — Assessment & Plan Note (Signed)
Consider if  sleep disturbance is more from cough-  the possibility of reflux at night Irregular sleep schedule and complaint of daytime sleepiness with AHI in normal range( 3.5 per hour) risk possibility of a primary disorder of excessive sleepiness such as narcolepsy. I don't get a history of cataplexy. Plan-sleep hygiene, prescription Nuvigil with discussion

## 2013-08-26 DIAGNOSIS — H02409 Unspecified ptosis of unspecified eyelid: Secondary | ICD-10-CM | POA: Diagnosis not present

## 2013-08-26 DIAGNOSIS — H5710 Ocular pain, unspecified eye: Secondary | ICD-10-CM | POA: Diagnosis not present

## 2013-09-02 ENCOUNTER — Other Ambulatory Visit: Payer: Medicare Other

## 2013-09-06 ENCOUNTER — Ambulatory Visit: Payer: Self-pay | Admitting: Internal Medicine

## 2013-09-06 ENCOUNTER — Encounter (INDEPENDENT_AMBULATORY_CARE_PROVIDER_SITE_OTHER): Payer: Medicare Other | Admitting: Ophthalmology

## 2013-09-06 DIAGNOSIS — H35359 Cystoid macular degeneration, unspecified eye: Secondary | ICD-10-CM | POA: Diagnosis not present

## 2013-09-06 DIAGNOSIS — H1045 Other chronic allergic conjunctivitis: Secondary | ICD-10-CM | POA: Diagnosis not present

## 2013-09-06 DIAGNOSIS — H3530 Unspecified macular degeneration: Secondary | ICD-10-CM | POA: Diagnosis not present

## 2013-09-06 DIAGNOSIS — H43819 Vitreous degeneration, unspecified eye: Secondary | ICD-10-CM

## 2013-09-19 ENCOUNTER — Other Ambulatory Visit: Payer: Self-pay | Admitting: Nurse Practitioner

## 2013-09-20 NOTE — Telephone Encounter (Signed)
Patient states that she is currently taking Cymbalta 60 mg. Patient states the Zoloft had no affect on her.

## 2013-09-28 ENCOUNTER — Ambulatory Visit: Payer: Medicare Other | Admitting: Internal Medicine

## 2013-11-02 ENCOUNTER — Ambulatory Visit: Payer: Medicare Other | Admitting: Internal Medicine

## 2013-11-15 ENCOUNTER — Other Ambulatory Visit: Payer: Self-pay | Admitting: *Deleted

## 2013-11-15 MED ORDER — PANTOPRAZOLE SODIUM 40 MG PO TBEC: 40.0000 mg | DELAYED_RELEASE_TABLET | Freq: Every day | ORAL | Status: DC

## 2013-11-16 ENCOUNTER — Ambulatory Visit (INDEPENDENT_AMBULATORY_CARE_PROVIDER_SITE_OTHER): Payer: Medicare Other | Admitting: Internal Medicine

## 2013-11-16 ENCOUNTER — Encounter: Payer: Self-pay | Admitting: Internal Medicine

## 2013-11-16 VITALS — BP 182/100 | HR 136 | Temp 97.7°F | Resp 18 | Ht <= 58 in | Wt 213.0 lb

## 2013-11-16 DIAGNOSIS — R339 Retention of urine, unspecified: Secondary | ICD-10-CM | POA: Diagnosis not present

## 2013-11-16 DIAGNOSIS — R3915 Urgency of urination: Secondary | ICD-10-CM

## 2013-11-16 DIAGNOSIS — E039 Hypothyroidism, unspecified: Secondary | ICD-10-CM

## 2013-11-16 DIAGNOSIS — R109 Unspecified abdominal pain: Secondary | ICD-10-CM | POA: Diagnosis not present

## 2013-11-16 MED ORDER — LISINOPRIL 5 MG PO TABS: 5.0000 mg | ORAL_TABLET | Freq: Every day | ORAL | Status: DC

## 2013-11-16 NOTE — Progress Notes (Signed)
Patient ID: Jody OsierJessie D Porter, female   DOB: 10/15/1935, 78 y.o.   MRN: [id number]    Chief Complaint  Patient presents with  . Acute Visit    ? kidney infection   Allergies  Allergen Reactions  . Ciprofloxacin   . Diazepam   . Penicillins   . Prednisone     Marked mentation/ mood change   HPI 78 y/o female patient is here for AV. She is extremely anxious and mentions having history fo ADHD.  She has been having problem with urination for two and a half months.  Pain in left lower back going to the groin for 3 weeks Has increased urinary urgency but denies dysuria, hematuria or frequency. Has noticed her underwear to be stained dark at end of day but denies any blood Has not been drinking much water Unable to completely evacuate her bladder Has been feeling tired and weak Thinks she is taking her medications but could forget some at times  ROS No fever or chills or diaphoresis No chest pain or dyspnea No nausea or vomiting or abdominal pain Appetite is fair Feels tired all the time Denies headache or focal weakness or dizziness  Past Medical History  Diagnosis Date  . LBP (low back pain)   . Depression   . Hypertension   . GERD (gastroesophageal reflux disease)   . Hypothyroid   . Hyperparathyroidism   . Insomnia     w/ sleep apnea NPSG 07/13/2009 AHI 3.5, RDI 29.9/hr  . Allergic rhinitis   . Anxiety   . Celiac disease    Current Outpatient Prescriptions on File Prior to Visit  Medication Sig Dispense Refill  . Biotin 1000 MCG tablet Take 1,000 mcg by mouth daily.        . diphenhydrAMINE (BENADRYL) 25 mg capsule Take 25 mg by mouth at bedtime as needed.       . DULoxetine (CYMBALTA) 60 MG capsule TAKE ONE TABLET BY MOUTH DAILY  60 capsule  3  . ketorolac (ACULAR) 0.4 % SOLN One drop right eye four times daily      . pantoprazole (PROTONIX) 40 MG tablet Take 1 tablet (40 mg total) by mouth daily.  30 tablet  3  . levothyroxine (SYNTHROID, LEVOTHROID) 25 MCG tablet  TAKE 1 TAB EVERY MORNING WITH FULL GLASS OF WATER 1 HOUR BEFORE EATING OR DRINKING  30 tablet  1   No current facility-administered medications on file prior to visit.    Physical exam BP 182/100  Pulse 136  Temp(Src) 97.7 F (36.5 C) (Oral)  Resp 18  Ht 4\' 6"  (1.372 m)  Wt 213 lb (96.616 kg)  BMI 51.33 kg/m2  SpO2 98%  Constitutional: She is alert, obese and oriented to person, place, and time.  HENT:   Head: Normocephalic and atraumatic.  Eyes: EOM are normal. Pupils are equal, round, and reactive to light.  Neck: Neck supple. No JVD present.  Cardiovascular: Normal rate, regular rhythm, normal heart sounds and intact distal pulses.   Pulmonary/Chest: Effort normal and breath sounds normal. No respiratory distress.  Abdominal: Soft. Bowel sounds are normal. She exhibits no distension and no mass. There is no CVA tenderness. No suprapubic tenderness or distension noted. No guarding or rigidity Musculoskeletal: Normal range of motion. She exhibits no edema and no tenderness. Steady gait Neurological: She is alert and oriented to person, place, and time. No cranial nerve deficit. Coordination normal.  Skin: Skin is warm and dry.  Psychiatric: anxious and jumps from  one topic to another   Lab Results  Component Value Date   TSH 8.010* 04/14/2013    Lab Results  Component Value Date   WBC 8.8 12/09/2012   HGB 13.8 12/09/2012   HCT 41.8 12/09/2012   MCV 86 12/09/2012   PLT 243 12/09/2012   Lab Results  Component Value Date   CREATININE 0.78 04/14/2013   Assessment/plan  1. Urinary urgency Will get urine study for cuanalysis and culture to assess further  For infection. Also check renal function to assess for acute renal failure and ? obstruction - CBC with Differential - CMP - UA/M w/rflx Culture, Routine - US Renal; Future  2. Left flank pain Get renal ultrasound to rule out stone vs mass. Check cmp to assess on renal function - CMP - UA/M w/rflx Culture, Routine -  US Renal; Future  3. Unspecified hypothyroidism With her worsening fatigue and weakness, check on thyroid panel and adjust levothyroxine if needed - TSH  4. Incomplete bladder emptying - US Renal; Future to rule out obstructive causes.

## 2013-11-17 ENCOUNTER — Other Ambulatory Visit: Payer: Medicare Other

## 2013-11-17 DIAGNOSIS — R3915 Urgency of urination: Secondary | ICD-10-CM | POA: Diagnosis not present

## 2013-11-17 DIAGNOSIS — R109 Unspecified abdominal pain: Secondary | ICD-10-CM | POA: Diagnosis not present

## 2013-11-17 LAB — CBC WITH DIFFERENTIAL/PLATELET
Basophils Absolute: 0 10*3/uL (ref 0.0–0.2)
Basos: 1 %
Eos: 3 %
Eosinophils Absolute: 0.2 10*3/uL (ref 0.0–0.4)
HCT: 40.8 % (ref 34.0–46.6)
Hemoglobin: 13.4 g/dL (ref 11.1–15.9)
IMMATURE GRANULOCYTES: 0 %
Immature Grans (Abs): 0 10*3/uL (ref 0.0–0.1)
Lymphocytes Absolute: 1.7 10*3/uL (ref 0.7–3.1)
Lymphs: 24 %
MCH: 27.4 pg (ref 26.6–33.0)
MCHC: 32.8 g/dL (ref 31.5–35.7)
MCV: 83 fL (ref 79–97)
MONOCYTES: 9 %
Monocytes Absolute: 0.7 10*3/uL (ref 0.1–0.9)
Neutrophils Absolute: 4.5 10*3/uL (ref 1.4–7.0)
Neutrophils Relative %: 63 %
RBC: 4.89 x10E6/uL (ref 3.77–5.28)
RDW: 14.6 % (ref 12.3–15.4)
WBC: 7.1 10*3/uL (ref 3.4–10.8)

## 2013-11-17 LAB — COMPREHENSIVE METABOLIC PANEL
ALBUMIN: 4.3 g/dL (ref 3.5–4.8)
ALT: 18 IU/L (ref 0–32)
AST: 20 IU/L (ref 0–40)
Albumin/Globulin Ratio: 1.8 (ref 1.1–2.5)
Alkaline Phosphatase: 130 IU/L — ABNORMAL HIGH (ref 39–117)
BUN/Creatinine Ratio: 16 (ref 11–26)
BUN: 14 mg/dL (ref 8–27)
CALCIUM: 9.5 mg/dL (ref 8.7–10.3)
CO2: 23 mmol/L (ref 18–29)
Chloride: 101 mmol/L (ref 97–108)
Creatinine, Ser: 0.89 mg/dL (ref 0.57–1.00)
GFR calc Af Amer: 73 mL/min/{1.73_m2} (ref >59)
GFR calc non Af Amer: 64 mL/min/{1.73_m2} (ref >59)
GLOBULIN, TOTAL: 2.4 g/dL (ref 1.5–4.5)
GLUCOSE: 111 mg/dL — AB (ref 65–99)
Potassium: 4.3 mmol/L (ref 3.5–5.2)
Sodium: 139 mmol/L (ref 134–144)
Total Bilirubin: <0.2 mg/dL (ref 0.0–1.2)
Total Protein: 6.7 g/dL (ref 6.0–8.5)

## 2013-11-17 LAB — TSH: TSH: 7.84 u[IU]/mL — ABNORMAL HIGH (ref 0.450–4.500)

## 2013-11-18 ENCOUNTER — Ambulatory Visit
Admission: RE | Admit: 2013-11-18 | Discharge: 2013-11-18 | Disposition: A | Discharge status: Home / Self Care | Payer: Medicare Other | Source: Ambulatory Visit | Attending: Internal Medicine | Admitting: Internal Medicine

## 2013-11-18 ENCOUNTER — Telehealth: Payer: Self-pay | Admitting: *Deleted

## 2013-11-18 DIAGNOSIS — R339 Retention of urine, unspecified: Secondary | ICD-10-CM

## 2013-11-18 DIAGNOSIS — R109 Unspecified abdominal pain: Secondary | ICD-10-CM | POA: Diagnosis not present

## 2013-11-18 DIAGNOSIS — R3915 Urgency of urination: Secondary | ICD-10-CM

## 2013-11-18 LAB — UA/M W/RFLX CULTURE, ROUTINE
BILIRUBIN UA: NEGATIVE
Glucose, UA: NEGATIVE
Ketones, UA: NEGATIVE
Leukocytes, UA: NEGATIVE
Nitrite, UA: NEGATIVE
PH UA: 7.5 (ref 5.0–7.5)
PROTEIN UA: NEGATIVE
RBC, UA: NEGATIVE
Specific Gravity, UA: 1.011 (ref 1.005–1.030)
Urobilinogen, Ur: 0.2 mg/dL (ref 0.0–1.9)

## 2013-11-18 LAB — MICROSCOPIC EXAMINATION

## 2013-11-18 MED ORDER — NAPROXEN 500 MG PO TABS: ORAL_TABLET | ORAL | Status: DC

## 2013-11-18 NOTE — Telephone Encounter (Signed)
Patient called and stated that the pain has gotten hard to deal with. Patient has tried Ibuprofen 800mg  with no relief and Tylenol with no relief. Patient cannot take Hydrocodone. Would like something called in. Please Advise.

## 2013-11-18 NOTE — Telephone Encounter (Signed)
Lets have her take naproxen 500 mg bid - give a week's supply. Have her get the renal ultrasound. Will follow on urine culture result to assess for infection. To notify us if has no improvement in pain. Can you make sure she has routine appointment with Shanda BumpsJessica- appears she has seen her a couple of times in past.thanks

## 2013-11-18 NOTE — Telephone Encounter (Signed)
Patient Notified and faxed Rx into pharmacy. Patient stated that she will keep the appointment on the 21st with you. Had renal ultrasound done today.

## 2013-11-19 ENCOUNTER — Other Ambulatory Visit: Payer: Self-pay | Admitting: *Deleted

## 2013-11-19 MED ORDER — LEVOTHYROXINE SODIUM 50 MCG PO TABS: 50.0000 ug | ORAL_TABLET | Freq: Every day | ORAL | Status: DC

## 2013-11-19 NOTE — Telephone Encounter (Signed)
Pt notified VIA phone regarding results/recommendations and RX sent to the pharmacy.  

## 2013-11-22 ENCOUNTER — Telehealth: Payer: Self-pay | Admitting: *Deleted

## 2013-11-22 NOTE — Telephone Encounter (Signed)
Patient called and stated that she is no better and thinks her problems are not being addressed by the Dr. Properly. Has severe pain and feels so exhausted and tired. Not a kidney blockage, but has something going on and you haven't proven what it is. Urine is getting less and less and the pain in back and side of stomach is unbearable. And get very hot doing nothing.

## 2013-11-22 NOTE — Telephone Encounter (Signed)
Has she started with the increased dose of levothyroxine as mentioned in the previous lab result? pls verify this with patient. Also i have an opening at 10:15 tomorrow- have her come in for follow up appointment tomorrow. If pain is severe enough, she will need to go to urgent care for urgent assessment

## 2013-11-22 NOTE — Telephone Encounter (Signed)
Patient stated that she has increased her Levothyroxine dose. Patient dose not want to go to Urgent care center. She scheduled an appointment to be seen tomorrow by Dr. Glade LloydPandey.

## 2013-11-23 ENCOUNTER — Encounter: Payer: Self-pay | Admitting: Internal Medicine

## 2013-11-23 ENCOUNTER — Ambulatory Visit (INDEPENDENT_AMBULATORY_CARE_PROVIDER_SITE_OTHER): Payer: Medicare Other | Admitting: Internal Medicine

## 2013-11-23 VITALS — BP 130/84 | HR 96 | Resp 12 | Wt 214.0 lb

## 2013-11-23 DIAGNOSIS — I10 Essential (primary) hypertension: Secondary | ICD-10-CM | POA: Insufficient documentation

## 2013-11-23 DIAGNOSIS — M545 Low back pain, unspecified: Secondary | ICD-10-CM | POA: Diagnosis not present

## 2013-11-23 DIAGNOSIS — R34 Anuria and oliguria: Secondary | ICD-10-CM | POA: Insufficient documentation

## 2013-11-23 DIAGNOSIS — R5383 Other fatigue: Secondary | ICD-10-CM | POA: Insufficient documentation

## 2013-11-23 DIAGNOSIS — F329 Major depressive disorder, single episode, unspecified: Secondary | ICD-10-CM | POA: Diagnosis not present

## 2013-11-23 DIAGNOSIS — R5381 Other malaise: Secondary | ICD-10-CM

## 2013-11-23 DIAGNOSIS — F3289 Other specified depressive episodes: Secondary | ICD-10-CM

## 2013-11-23 DIAGNOSIS — E038 Other specified hypothyroidism: Secondary | ICD-10-CM | POA: Diagnosis not present

## 2013-11-23 DIAGNOSIS — F32A Depression, unspecified: Secondary | ICD-10-CM | POA: Insufficient documentation

## 2013-11-23 MED ORDER — NAPROXEN 500 MG PO TABS: ORAL_TABLET | ORAL | Status: DC

## 2013-11-23 NOTE — Progress Notes (Signed)
Patient ID: Jody Porter, female   DOB: Jan 12, 1936, 78 y.o.   MRN: [id number]    Chief Complaint  Patient presents with  . Acute Visit    weakness, abnormal thyroid function, HTN   Allergies  Allergen Reactions  . Sugar-Protein-Starch Shortness Of Breath  . Ciprofloxacin   . Diazepam   . Penicillins   . Prednisone     Marked mentation/ mood change  . Sudafed  [Pseudoephedrine Hcl]     Hyperactive and agitated  . Acyclovir Rash   HPI 78 y/o female patient is here for acute visit. i saw her last week for urinary complaints and back pain. Her urine test was reviewed. She denies any urinary complaints today. She complaints of lower back pain on left side with some radiation to groin and buttock area but mentions that naprosyn has been helping her. She has not taken the new dose of thyroid medication. She mentions that she was taking 12 mcg of thyroid pill until today but no such dose noted in med list. She also mentions feeling extremely weak and tired. She is having a hard time losing weight. She wants to be started on phenobarbital as this helped with her weakness several years back.  ROS No fever or chills No nausea or vomiting Mood ok Low in terms of energy No headache or vision change Normal bowel movement Has generalized abdominal pain on and off for several months Has had decreased urinary frequency and is concerned about her urine output Denies hematuria or dysuria  Past Medical History  Diagnosis Date  . LBP (low back pain)   . Depression   . Hypertension   . GERD (gastroesophageal reflux disease)   . Hypothyroid   . Hyperparathyroidism   . Insomnia     w/ sleep apnea NPSG 07/13/2009 AHI 3.5, RDI 29.9/hr  . Allergic rhinitis   . Anxiety   . Celiac disease    Current Outpatient Prescriptions on File Prior to Visit  Medication Sig Dispense Refill  . Biotin 1000 MCG tablet Take 1,000 mcg by mouth daily.        . diphenhydrAMINE (BENADRYL) 25 mg capsule Take 25  mg by mouth at bedtime as needed.       . DULoxetine (CYMBALTA) 60 MG capsule TAKE ONE TABLET BY MOUTH DAILY  60 capsule  3  . ketorolac (ACULAR) 0.4 % SOLN One drop right eye four times daily      . lisinopril (PRINIVIL,ZESTRIL) 5 MG tablet Take 1 tablet (5 mg total) by mouth daily.  30 tablet  3  . pantoprazole (PROTONIX) 40 MG tablet Take 1 tablet (40 mg total) by mouth daily.  30 tablet  3  . levothyroxine (SYNTHROID, LEVOTHROID) 50 MCG tablet Take 1 tablet (50 mcg total) by mouth daily before breakfast.  30 tablet  2   No current facility-administered medications on file prior to visit.   Physical exam BP 130/84  Pulse 96  Resp 12  Wt 214 lb (97.07 kg)  SpO2 98%  Constitutional: She is alert, obese and oriented to person, place, and time.   HENT:   Head: Normocephalic and atraumatic.   Eyes: EOM are normal. Pupils are equal, round, and reactive to light.   Neck: Neck supple.   Cardiovascular: Normal rate, regular rhythm, normal heart sounds     Pulmonary/Chest: Effort normal and breath sounds normal. No respiratory distress.   Abdominal: Soft. Bowel sounds are normal. She exhibits no distension and no mass. There is  no CVA tenderness. No guarding or rigidity. Suprapubic discomfort present Musculoskeletal: She exhibits no edema and no tenderness. Steady gait Neurological: She is alert and oriented to person, place, and time. No cranial nerve deficit. Coordination normal.   Skin: Skin is warm and dry.  Psychiatric: anxious   Labs-  Lab Results  Component Value Date   WBC 7.1 11/16/2013   HGB 13.4 11/16/2013   HCT 40.8 11/16/2013   MCV 83 11/16/2013   PLT 243 12/09/2012   CMP     Component Value Date/Time   NA 139 11/16/2013 1720   NA 140 02/06/2009 1309   K 4.3 11/16/2013 1720   CL 101 11/16/2013 1720   CO2 23 11/16/2013 1720   GLUCOSE 111* 11/16/2013 1720   GLUCOSE 111* 02/06/2009 1309   BUN 14 11/16/2013 1720   BUN 15 02/06/2009 1309   CREATININE 0.89 11/16/2013 1720   CALCIUM 9.5  11/16/2013 1720   CALCIUM 10.3 02/24/2007 2252   PROT 6.7 11/16/2013 1720   PROT 7.1 11/10/2008 1310   ALBUMIN 4.2 11/10/2008 1310   AST 20 11/16/2013 1720   ALT 18 11/16/2013 1720   ALKPHOS 130* 11/16/2013 1720   BILITOT <0.2 11/16/2013 1720   GFRNONAA 64 11/16/2013 1720   GFRAA 73 11/16/2013 1720   Lab Results  Component Value Date   TSH 7.840* 11/16/2013   Assessment/plan  1. Other specified hypothyroidism Will have her on 50 mcg of levothyroxine once a day for now. Recheck thyroid function in 4 weeks  2. Depression Continue her duoloxetine. Once thyroid function has improved, will need reassessment of med need  3. Essential hypertension, benign Stable, tolerating lisinopril well. Continue current dose  4. Left-sided low back pain without sciatica On review of prior imaging has hx of lumbar disc bulging and laminectomy. Continue naprosyn and refill provided.  - Ambulatory referral to Urology  5. Low urine output Unclear of the cause at present. No signs of infection on exam. Normal ultrasound result. Will provide referral to urology for bladder scan and assessment of residual volume with further assessement - Ambulatory referral to Urology  6. Other malaise and fatigue Most likely from her uncontrolled hypothyroidism. Will need to take her levothyroxine as prescribed. Recheck thyroid function in few weeks and adjust dose further if needed. Anemia has been ruled out. She could also be having a component of OHS contributing to her fatigue. Will need to wok up further.   Patient to see Shanda BumpsJessica in 2-3 weeks

## 2013-11-30 ENCOUNTER — Encounter: Payer: Self-pay | Admitting: Internal Medicine

## 2013-11-30 ENCOUNTER — Ambulatory Visit (INDEPENDENT_AMBULATORY_CARE_PROVIDER_SITE_OTHER): Payer: Medicare Other | Admitting: Internal Medicine

## 2013-11-30 VITALS — BP 142/80 | HR 94 | Temp 96.9°F | Resp 20 | Ht <= 58 in | Wt 213.2 lb

## 2013-11-30 DIAGNOSIS — R5383 Other fatigue: Secondary | ICD-10-CM

## 2013-11-30 DIAGNOSIS — R5381 Other malaise: Secondary | ICD-10-CM | POA: Diagnosis not present

## 2013-11-30 DIAGNOSIS — E039 Hypothyroidism, unspecified: Secondary | ICD-10-CM

## 2013-11-30 DIAGNOSIS — I10 Essential (primary) hypertension: Secondary | ICD-10-CM

## 2013-11-30 MED ORDER — LEVOTHYROXINE SODIUM 125 MCG PO TABS: 125.0000 ug | ORAL_TABLET | Freq: Every day | ORAL | Status: DC

## 2013-11-30 NOTE — Progress Notes (Signed)
Patient ID: Jody Porter, female   DOB: 07/08/1935, 78 y.o.   MRN: [id number]    Chief Complaint  Patient presents with  . Follow-up    thyroid, urine problem   Allergies  Allergen Reactions  . Sugar-Protein-Starch Shortness Of Breath  . Ciprofloxacin   . Diazepam   . Penicillins   . Prednisone     Marked mentation/ mood change  . Sudafed  [Pseudoephedrine Hcl]     Hyperactive and agitated  . Acyclovir Rash   HPI 78 y/o female patient is here for acute visit. She mentions that she found her prior thyroid pill bottle. It was 112 mcg and she started taking them since last visit a week back. Now she is out of it. She was started on 50 mcg daily last week. She mentions being on high dose before and not taking 112 dosing for few weeks.  Missed her urology appointment for her urinary output problem and is now rescheduled for 12/08/13 She still is low in terms of energy She does not want to see psychiatry at present  ROS No fever or chills No nausea or vomiting Mood ok Low in terms of energy No headache or vision change  Past Medical History  Diagnosis Date  . LBP (low back pain)   . Depression   . Hypertension   . GERD (gastroesophageal reflux disease)   . Hypothyroid   . Hyperparathyroidism   . Insomnia     w/ sleep apnea NPSG 07/13/2009 AHI 3.5, RDI 29.9/hr  . Allergic rhinitis   . Anxiety   . Celiac disease    Medication reviewed. See [facility] CM[facility]  Physical exam BP 142/80  Pulse 94  Temp(Src) 96.9 F (36.1 C) (Oral)  Resp 20  Ht 4\' 10"  (1.473 m)  Wt 213 lb 3.2 oz (96.707 kg)  BMI 44.57 kg/m2  SpO2 97%  Constitutional: She is alert, obese and oriented to person, place, and time.   HENT:   Head: Normocephalic and atraumatic.   Eyes: EOM are normal. Pupils are equal, round, and reactive to light.   Neck: Neck supple.   Cardiovascular: Normal rate, regular rhythm, normal heart sounds     Pulmonary/Chest: Effort normal and breath sounds normal. No respiratory  distress.   Abdominal: Soft. Bowel sounds are normal. She exhibits no distension and no mass. There is no CVA tenderness. No guarding or rigidity. Suprapubic discomfort present  Lab Results  Component Value Date   TSH 7.840* 11/16/2013    Assessment/plan  1. HYPOTHYROIDISM NOS New script for levothyroxine 125 mcg daily provided. Repeat tsh in 4 weeks. Med compliance reinforced  2. HYPERTENSION Stable on lisinopril for now  3. Other malaise and fatigue Correction of her thyroid level and increasing her activity level with exercise and dietary modification encouraged for now.

## 2013-12-08 DIAGNOSIS — N200 Calculus of kidney: Secondary | ICD-10-CM | POA: Diagnosis not present

## 2013-12-08 DIAGNOSIS — R351 Nocturia: Secondary | ICD-10-CM | POA: Diagnosis not present

## 2013-12-08 DIAGNOSIS — N393 Stress incontinence (female) (male): Secondary | ICD-10-CM | POA: Diagnosis not present

## 2013-12-08 DIAGNOSIS — R109 Unspecified abdominal pain: Secondary | ICD-10-CM | POA: Diagnosis not present

## 2013-12-15 ENCOUNTER — Telehealth: Payer: Self-pay | Admitting: *Deleted

## 2013-12-15 DIAGNOSIS — N200 Calculus of kidney: Secondary | ICD-10-CM | POA: Diagnosis not present

## 2013-12-15 DIAGNOSIS — N393 Stress incontinence (female) (male): Secondary | ICD-10-CM | POA: Diagnosis not present

## 2013-12-15 DIAGNOSIS — K7689 Other specified diseases of liver: Secondary | ICD-10-CM | POA: Diagnosis not present

## 2013-12-15 NOTE — Telephone Encounter (Signed)
Dr. Sherron MondayMacDiarmid with [facility] called and stated that they performed a CT Abdomen/Pelvis on 12/15/2013 and it came back abnormal and wants us to follow up with patient. CT Abdomen/Pelvic Impression: No nephrolithiasis or evidence of obstructive uropathy. Hepatic steatosis. Atherosclerotic disease. Sigmoid diverticulosis without acute inflammation. Degenerative disc disease. Suspicious nodule in the left lower lobe measures 1.9cm. Further evaluation with PET-CT is recommended. This recommendation follows the consensus statement; Guidelines for management of small pulmonary nodules detected on CT scans.   Patient has an appointment tomorrow 12/16/2013 with Candelaria CelesteJessica Karam. Given to Chrae to put in patient's chart for appointment tomorrow.

## 2013-12-16 ENCOUNTER — Encounter: Payer: Self-pay | Admitting: Nurse Practitioner

## 2013-12-16 ENCOUNTER — Ambulatory Visit (INDEPENDENT_AMBULATORY_CARE_PROVIDER_SITE_OTHER): Payer: Medicare Other | Admitting: Nurse Practitioner

## 2013-12-16 VITALS — BP 136/88 | HR 69 | Temp 97.6°F | Resp 10 | Ht <= 58 in | Wt 215.0 lb

## 2013-12-16 DIAGNOSIS — R911 Solitary pulmonary nodule: Secondary | ICD-10-CM

## 2013-12-16 DIAGNOSIS — I10 Essential (primary) hypertension: Secondary | ICD-10-CM

## 2013-12-16 DIAGNOSIS — R34 Anuria and oliguria: Secondary | ICD-10-CM | POA: Diagnosis not present

## 2013-12-16 DIAGNOSIS — E039 Hypothyroidism, unspecified: Secondary | ICD-10-CM

## 2013-12-16 NOTE — Patient Instructions (Signed)
Will have you follow up thyroid labs in 4 weeks Also we have ordered a PET scan to further evaluate nodule

## 2013-12-16 NOTE — Progress Notes (Signed)
Patient ID: SAVI LASTINGER, female   DOB: 05/30/1935, 78 y.o.   MRN: [id number]    Allergies  Allergen Reactions  . Sugar-Protein-Starch Shortness Of Breath  . Ciprofloxacin   . Diazepam   . Penicillins   . Prednisone     Marked mentation/ mood change  . Sudafed  [Pseudoephedrine Hcl]     Hyperactive and agitated  . Acyclovir Rash    Chief Complaint  Patient presents with  . Medical Management of Chronic Issues    3 week follow-up   . Medication Management    Patient lost Lisinopril, took 2 doses then mis-placed     HPI: Patient is a 78 y.o. female seen in the office today for follow up.  Went to urologist, reports they ?biopsied bladder but Did not find anything wrong, Reports she has very little urine. Reports she is always thirsty and drinks plenty of water.  Back pain is still the same, attacks her takes tylenol or ibuprofen as needed if it gets bad Has lost her lisinopril needs refill.  conts to complain of fatigue, reports she is taking synthroid as prescrbied   Review of Systems:  Review of Systems  Constitutional: Positive for malaise/fatigue. Negative for fever and weight loss.  Eyes: Negative for blurred vision.  Respiratory: Negative for shortness of breath.        OSA  Cardiovascular: Negative for chest pain.  Genitourinary: Negative for dysuria, urgency and frequency.  Musculoskeletal: Positive for joint pain. Negative for myalgias.  Skin: Negative for rash.  Neurological: Positive for weakness. Negative for headaches.  Psychiatric/Behavioral: Positive for depression.     Past Medical History  Diagnosis Date  . LBP (low back pain)   . Depression   . Hypertension   . GERD (gastroesophageal reflux disease)   . Hypothyroid   . Hyperparathyroidism   . Insomnia     w/ sleep apnea NPSG 07/13/2009 AHI 3.5, RDI 29.9/hr  . Allergic rhinitis   . Anxiety   . Celiac disease    Past Surgical History  Procedure Laterality Date  . Lumbar disc surgery    .  Parathyroidectomy    . Cholecystectomy    . Knee surgery Right     reports multiple surgery on knee  . Carpel tunnel Bilateral   . Cesarean section    . Cataracts     Social History:   reports that she has never smoked. She does not have any smokeless tobacco history on file. She reports that she does not drink alcohol or use illicit drugs.  Family History  Problem Relation Age of Onset  . Cancer Mother   . Cancer Father   . Cancer Paternal Grandmother   . Cancer Maternal Grandmother     Medications: Patient's Medications  New Prescriptions   No medications on file  Previous Medications   BIOTIN 1000 MCG TABLET    Take 1,000 mcg by mouth daily.     DIPHENHYDRAMINE (BENADRYL) 25 MG CAPSULE    Take 25 mg by mouth at bedtime as needed.    DULOXETINE (CYMBALTA) 60 MG CAPSULE    TAKE ONE TABLET BY MOUTH DAILY   KETOROLAC (ACULAR) 0.4 % SOLN    One drop right eye four times daily   LEVOTHYROXINE (SYNTHROID, LEVOTHROID) 125 MCG TABLET    Take 1 tablet (125 mcg total) by mouth daily.   LISINOPRIL (PRINIVIL,ZESTRIL) 5 MG TABLET    Take 1 tablet (5 mg total) by mouth daily.   PANTOPRAZOLE (PROTONIX)  40 MG TABLET    Take 1 tablet (40 mg total) by mouth daily.  Modified Medications   No medications on file  Discontinued Medications   NAPROXEN (NAPROSYN) 500 MG TABLET    Take one tablet by mouth twice daily as needed for pain     Physical Exam:  Filed Vitals:   12/16/13 1417  Pulse: 69  Temp: 97.6 F (36.4 C)  TempSrc: Oral  Resp: 10  Height: 4\' 10"  (1.473 m)  Weight: 215 lb (97.523 kg)  SpO2: 95%    Physical Exam  Vitals reviewed. Constitutional: She is oriented to person, place, and time and well-developed, well-nourished, and in no distress. No distress.  HENT:  Head: Normocephalic and atraumatic.  Mouth/Throat: Oropharynx is clear and moist. No oropharyngeal exudate.  Eyes: Conjunctivae and EOM are normal. Pupils are equal, round, and reactive to light.  Neck: Normal  range of motion. Neck supple.  Cardiovascular: Normal rate, regular rhythm and normal heart sounds.   Pulmonary/Chest: Effort normal and breath sounds normal. No respiratory distress.  Abdominal: Soft. Bowel sounds are normal. She exhibits no distension.  Musculoskeletal: Normal range of motion. She exhibits no edema and no tenderness.  Neurological: She is alert and oriented to person, place, and time. Gait normal.  Skin: Skin is warm and dry. She is not diaphoretic. No erythema.     Labs reviewed: Basic Metabolic Panel:  Recent Labs  82/95/6210/07/25 1506 11/16/13 1720  NA 140 139  K 4.1 4.3  CL 99 101  CO2 22 23  GLUCOSE 110* 111*  BUN 14 14  CREATININE 0.78 0.89  CALCIUM 9.3 9.5  TSH 8.010* 7.840*   Liver Function Tests:  Recent Labs  04/14/13 1506 11/16/13 1720  AST 15 20  ALT 12 18  ALKPHOS 116 130*  BILITOT <0.2 <0.2  PROT 6.6 6.7   No results found for this basename: LIPASE, AMYLASE,  in the last 8760 hours No results found for this basename: AMMONIA,  in the last 8760 hours CBC:  Recent Labs  11/16/13 1720  WBC 7.1  NEUTROABS 4.5  HGB 13.4  HCT 40.8  MCV 83   Lipid Panel: No results found for this basename: CHOL, HDL, LDLCALC, TRIG, CHOLHDL, LDLDIRECT,  in the last 8760 hours TSH:  Recent Labs  04/14/13 1506 11/16/13 1720  TSH 8.010* 7.840*   A1C: Lab Results  Component Value Date   HGBA1C 6.4* 12/09/2012     Assessment/Plan  1. Lung nodule -incidental finding from urology work up, PET scan recommended  - NM PET Image Initial (PI) Skull Base To Thigh; Future  2. Essential hypertension, benign -will provide refill for lisinopril at this time - Comprehensive metabolic panel; Future  3. HYPOTHYROIDISM NOS conts on synthroid 125 mcg daily, will follow up lab - TSH; Future  4. Low urine output -following with urology, no acute findings, renal function is normal

## 2013-12-22 ENCOUNTER — Encounter (HOSPITAL_COMMUNITY): Payer: Self-pay

## 2013-12-22 ENCOUNTER — Ambulatory Visit (HOSPITAL_COMMUNITY)
Admission: RE | Admit: 2013-12-22 | Discharge: 2013-12-22 | Disposition: A | Discharge status: Home / Self Care | Payer: Medicare Other | Source: Ambulatory Visit | Attending: Diagnostic Radiology | Admitting: Diagnostic Radiology

## 2013-12-22 DIAGNOSIS — Z9089 Acquired absence of other organs: Secondary | ICD-10-CM | POA: Insufficient documentation

## 2013-12-22 DIAGNOSIS — R911 Solitary pulmonary nodule: Secondary | ICD-10-CM | POA: Insufficient documentation

## 2013-12-22 DIAGNOSIS — E039 Hypothyroidism, unspecified: Secondary | ICD-10-CM | POA: Insufficient documentation

## 2013-12-22 LAB — GLUCOSE, CAPILLARY: Glucose-Capillary: 121 mg/dL — ABNORMAL HIGH (ref 70–99)

## 2013-12-22 MED ORDER — FLUDEOXYGLUCOSE F - 18 (FDG) INJECTION
10.6800 | Freq: Once | INTRAVENOUS | Status: AC | PRN
  Administered 2013-12-22: 10.68 via INTRAVENOUS

## 2013-12-27 ENCOUNTER — Telehealth: Payer: Self-pay

## 2013-12-27 NOTE — Telephone Encounter (Signed)
Patient is calling requesting results of PET Scan, please advise

## 2013-12-27 NOTE — Telephone Encounter (Signed)
Per Candelaria CelesteJessica Karam, NP nothing of immediate concern, to be further addressed via phone call to patient on Thursday (when In Office).  Dr.Reed reviewed as well to confirm ok for report to wait until Thursday when Shanda BumpsJessica is in the office to call patient directly  Spoke with patient, patient verbalized understanding of Jessica's response

## 2013-12-31 MED ORDER — LISINOPRIL 5 MG PO TABS: 5.0000 mg | ORAL_TABLET | Freq: Every day | ORAL | Status: DC

## 2013-12-31 NOTE — Telephone Encounter (Signed)
Pt called and given report

## 2014-01-07 ENCOUNTER — Ambulatory Visit (INDEPENDENT_AMBULATORY_CARE_PROVIDER_SITE_OTHER): Payer: Medicare Other | Admitting: Ophthalmology

## 2014-01-10 ENCOUNTER — Ambulatory Visit (INDEPENDENT_AMBULATORY_CARE_PROVIDER_SITE_OTHER): Payer: Medicare Other | Admitting: Ophthalmology

## 2014-01-10 DIAGNOSIS — I10 Essential (primary) hypertension: Secondary | ICD-10-CM

## 2014-01-10 DIAGNOSIS — H35359 Cystoid macular degeneration, unspecified eye: Secondary | ICD-10-CM

## 2014-01-10 DIAGNOSIS — H35039 Hypertensive retinopathy, unspecified eye: Secondary | ICD-10-CM | POA: Diagnosis not present

## 2014-01-10 DIAGNOSIS — H43819 Vitreous degeneration, unspecified eye: Secondary | ICD-10-CM

## 2014-01-10 DIAGNOSIS — H3530 Unspecified macular degeneration: Secondary | ICD-10-CM

## 2014-01-13 ENCOUNTER — Ambulatory Visit: Payer: Self-pay | Admitting: Internal Medicine

## 2014-01-13 DIAGNOSIS — Z0289 Encounter for other administrative examinations: Secondary | ICD-10-CM

## 2014-01-18 ENCOUNTER — Other Ambulatory Visit: Payer: Medicare Other

## 2014-01-19 ENCOUNTER — Ambulatory Visit (INDEPENDENT_AMBULATORY_CARE_PROVIDER_SITE_OTHER): Payer: Medicare Other | Admitting: Internal Medicine

## 2014-01-19 ENCOUNTER — Encounter: Payer: Self-pay | Admitting: Internal Medicine

## 2014-01-19 VITALS — HR 91 | Temp 97.4°F | Resp 12

## 2014-01-19 DIAGNOSIS — N3946 Mixed incontinence: Secondary | ICD-10-CM | POA: Diagnosis not present

## 2014-01-19 DIAGNOSIS — R413 Other amnesia: Secondary | ICD-10-CM | POA: Diagnosis not present

## 2014-01-19 DIAGNOSIS — R911 Solitary pulmonary nodule: Secondary | ICD-10-CM | POA: Insufficient documentation

## 2014-01-19 NOTE — Progress Notes (Signed)
Patient ID: Jody Porter, female   DOB: August 26, 1935, 78 y.o.   MRN: [id number]    Chief Complaint  Patient presents with  . Follow-up    Discuss Test Results   . Urinary Incontinence    Ongoing concern- getting worse . Only passes small amount of urine  . Other    Patient c/o problems focusing x several months   . MMSE    25/30, passsed clock drawing    Allergies  Allergen Reactions  . Sugar-Protein-Starch Shortness Of Breath  . Ciprofloxacin   . Diazepam   . Penicillins   . Prednisone     Marked mentation/ mood change  . Sudafed  [Pseudoephedrine Hcl]     Hyperactive and agitated  . Acyclovir Rash   HPI 78 y/o female patient is here for acute visit. She has several concerns  Urinary incontinence- has been seen by urology recently. Pt mentions no cause identified. No records to review from urology office at present  PET scan result review- a 16 mm nodule noted  Problem focusing for few months She has history of ADD but is not on any treatment Appears anxious Has hx of depression and hypothyroidism and is on meds Low energy level  Daughter and grand daughter present this visit  Review of Systems:  Constitutional: Positive for malaise/fatigue. Negative for fever and weight loss.  Eyes: Negative for blurred vision.  Respiratory: Negative for shortness of breath. has OSA, does not use her cpap regularly Cardiovascular: Negative for chest pain.   Musculoskeletal: Negative for myalgias.  Skin: Negative for rash.  Neurological: negative for headaches and dizziness  Psychiatric/Behavioral: Positive for depression.   Current Outpatient Prescriptions on File Prior to Visit  Medication Sig Dispense Refill  . Biotin 1000 MCG tablet Take 1,000 mcg by mouth daily.        . diphenhydrAMINE (BENADRYL) 25 mg capsule Take 25 mg by mouth at bedtime as needed.       . DULoxetine (CYMBALTA) 60 MG capsule TAKE ONE TABLET BY MOUTH DAILY  60 capsule  3  . ketorolac (ACULAR) 0.4 %  SOLN One drop right eye four times daily      . levothyroxine (SYNTHROID, LEVOTHROID) 125 MCG tablet Take 1 tablet (125 mcg total) by mouth daily.  30 tablet  3  . lisinopril (PRINIVIL,ZESTRIL) 5 MG tablet Take 1 tablet (5 mg total) by mouth daily.  30 tablet  3  . pantoprazole (PROTONIX) 40 MG tablet Take 1 tablet (40 mg total) by mouth daily.  30 tablet  3   No current facility-administered medications on file prior to visit.   Physical exam Pulse 91  Temp(Src) 97.4 F (36.3 C) (Oral)  Resp 12  SpO2 97%  Constitutional: She is oriented to person, place, and time HENT:   Head: Normocephalic and atraumatic.   Mouth/Throat: Oropharynx is clear and moist.  Eyes: Conjunctivae and EOM are normal. Pupils are equal, round, and reactive to light.  Neck: Normal range of motion. Neck supple.  Cardiovascular: Normal rate, regular rhythm and normal heart sounds.   Pulmonary/Chest: Effort normal and breath sounds normal. No respiratory distress.  Abdominal: Soft. Bowel sounds are normal. Distended, soft abdomen  Musculoskeletal: Normal range of motion. She exhibits no edema and no tenderness.  Neurological: She is alert and oriented to person, place, and time. Gait normal.  Skin: Skin is warm and dry. She is not diaphoretic. No erythema.   Lab Results  Component Value Date   TSH 7.840*  11/16/2013   12/22/13 pet scan- no signs of skeletal mets, mild metabolic actiivity of right infrahilar nodule- 16 mm in size, repeat ct with contrast recommended in 3 months  01/19/14 MMSE 25/30, passed clock draw  Assessment/plan  1. Solitary pulmonary nodule Repeat ct chest in 3 months, no personal hx of smoking, no family hx of lung cancer - CT Chest W Contrast; Future  2. Memory loss Score 25/30 s/o mild cognitive impairment. Has hypothyroidism (uncontrolled) which could contribute some. Has hx of HTN but pt mentions this to be wrong diagnosis. With her age and obesity could have some senile component and  some vascular component contributing to this. Will have neurology referral. Will also benefit from neuropsych referral further to help with her depression and ADD - Ambulatory referral to Neurology  3. Mixed incontinence Will need to review urology note. If normal bladder and no explanation found, will need detailed pelvis and uterine exam-? Fibroid, prolpase

## 2014-01-19 NOTE — Progress Notes (Signed)
Passed clock drawing 

## 2014-01-21 DIAGNOSIS — H43819 Vitreous degeneration, unspecified eye: Secondary | ICD-10-CM | POA: Diagnosis not present

## 2014-01-25 ENCOUNTER — Ambulatory Visit
Admission: RE | Admit: 2014-01-25 | Discharge: 2014-01-25 | Disposition: A | Discharge status: Home / Self Care | Payer: Medicare Other | Source: Ambulatory Visit | Attending: Internal Medicine | Admitting: Internal Medicine

## 2014-01-25 DIAGNOSIS — R911 Solitary pulmonary nodule: Secondary | ICD-10-CM | POA: Diagnosis not present

## 2014-01-25 DIAGNOSIS — I517 Cardiomegaly: Secondary | ICD-10-CM | POA: Diagnosis not present

## 2014-01-25 MED ORDER — IOHEXOL 300 MG/ML  SOLN
75.0000 mL | Freq: Once | INTRAMUSCULAR | Status: AC | PRN
  Administered 2014-01-25: 75 mL via INTRAVENOUS

## 2014-01-27 ENCOUNTER — Encounter: Payer: Self-pay | Admitting: Neurology

## 2014-01-27 ENCOUNTER — Ambulatory Visit (INDEPENDENT_AMBULATORY_CARE_PROVIDER_SITE_OTHER): Payer: Medicare Other | Admitting: Neurology

## 2014-01-27 ENCOUNTER — Encounter (INDEPENDENT_AMBULATORY_CARE_PROVIDER_SITE_OTHER): Payer: Self-pay

## 2014-01-27 VITALS — BP 151/82 | HR 105 | Ht <= 58 in | Wt 214.0 lb

## 2014-01-27 DIAGNOSIS — D518 Other vitamin B12 deficiency anemias: Secondary | ICD-10-CM | POA: Diagnosis not present

## 2014-01-27 DIAGNOSIS — R413 Other amnesia: Secondary | ICD-10-CM | POA: Diagnosis not present

## 2014-01-27 DIAGNOSIS — D513 Other dietary vitamin B12 deficiency anemia: Secondary | ICD-10-CM

## 2014-01-27 HISTORY — DX: Other amnesia: R41.3

## 2014-01-27 NOTE — Progress Notes (Signed)
Reason for visit: Memory disorder  Jody Porter is a 78 y.o. female  History of present illness:  Jody Porter is a 78 year old right-handed white female with a history of a progressive memory disturbance. The patient believes that she has had some issues with memory dating back almost 4 years, and she correlates this with issues that she had with her parathyroid glands. The patient indicates that once this problem was corrected, her concentration and memory never completely returned to baseline. The patient has particularly had issues of memory over the last several months, and her grandson has had to take over her finances as she has had difficulty with this. The patient requires some assistance with managing her medications and appointments as well. She misplaces things about the house. She has problems with remembering recent events. She has troubles with concentration while driving and while reading. She is concerned about her safety while driving. She has also noted some changes in her balance over the last one year, and she will fall on occasion. She has had some issues with urinary incontinence over the last 6 weeks. She reports no focal numbness or weakness of the face, arms, or legs. She denies headaches. She is sent to this office for an evaluation. Currently, she lives alone.  Past Medical History  Diagnosis Date  . LBP (low back pain)   . Depression   . Hypertension   . GERD (gastroesophageal reflux disease)   . Hypothyroid   . Hyperparathyroidism   . Insomnia     w/ sleep apnea NPSG 07/13/2009 AHI 3.5, RDI 29.9/hr  . Allergic rhinitis   . Anxiety   . Celiac disease   . Memory difficulties 01/27/2014  . Obesity   . Degenerative arthritis   . Macular edema     OD    Past Surgical History  Procedure Laterality Date  . Lumbar disc surgery    . Parathyroidectomy    . Cholecystectomy    . Knee surgery Right     reports multiple surgery on knee  . Carpel tunnel  Bilateral   . Cesarean section    . Cataracts      Family History  Problem Relation Age of Onset  . Cancer Mother   . Seizures Mother     grand mal  . Cancer Father     prostate  . Cancer Paternal Grandmother   . Cancer Maternal Grandmother   . Arthritis/Rheumatoid Sister     Social history:  reports that she has never smoked. She has never used smokeless tobacco. She reports that she does not drink alcohol or use illicit drugs.  Medications:  Current Outpatient Prescriptions on File Prior to Visit  Medication Sig Dispense Refill  . Biotin 1000 MCG tablet Take 1,000 mcg by mouth daily.        . diphenhydrAMINE (BENADRYL) 25 mg capsule Take 25 mg by mouth at bedtime as needed.       . DULoxetine (CYMBALTA) 60 MG capsule TAKE ONE TABLET BY MOUTH DAILY  60 capsule  3  . levothyroxine (SYNTHROID, LEVOTHROID) 125 MCG tablet Take 1 tablet (125 mcg total) by mouth daily.  30 tablet  3  . lisinopril (PRINIVIL,ZESTRIL) 5 MG tablet Take 1 tablet (5 mg total) by mouth daily.  30 tablet  3  . pantoprazole (PROTONIX) 40 MG tablet Take 1 tablet (40 mg total) by mouth daily.  30 tablet  3   No current facility-administered medications on file prior to visit.  Allergies  Allergen Reactions  . Sugar-Protein-Starch Shortness Of Breath  . Ciprofloxacin   . Diazepam   . Penicillins   . Prednisone     Marked mentation/ mood change  . Sudafed  [Pseudoephedrine Hcl]     Hyperactive and agitated  . Acyclovir Rash    ROS:  Out of a complete 14 system review of symptoms, the patient complains only of the following symptoms, and all other reviewed systems are negative.  Fatigue Swelling in the legs Hearing loss, ringing in the ears Blurred vision, eye pain Shortness of breath, cough, wheezing, snoring Incontinence of bladder Feeling hot Joint pain Allergies, runny nose Memory loss, confusion, weakness Depression, anxiety, not enough sleep, decreased energy, disinterest in  activities Insomnia, sleepiness  Blood pressure 151/82, pulse 105, height 4' 9.5" (1.461 m), weight 214 lb (97.07 kg).  Physical Exam  General: The patient is alert and cooperative at the time of the examination. The patient is moderately to markedly obese.  Eyes: Pupils are equal, round, and reactive to light. Discs are flat bilaterally.  Neck: The neck is supple, no carotid bruits are noted.  Respiratory: The respiratory examination is clear.  Cardiovascular: The cardiovascular examination reveals a regular rate and rhythm, no obvious murmurs or rubs are noted.  Skin: Extremities are without significant edema.  Neurologic Exam  Mental status: The Mini-Mental status examination done today shows a total score 25/30.  Cranial nerves: Facial symmetry is present. There is good sensation of the face to pinprick and soft touch bilaterally. The strength of the facial muscles and the muscles to head turning and shoulder shrug are normal bilaterally. Speech is well enunciated, no aphasia or dysarthria is noted. Extraocular movements are full. Visual fields are full. The tongue is midline, and the patient has symmetric elevation of the soft palate. No obvious hearing deficits are noted.  Motor: The motor testing reveals 5 over 5 strength of all 4 extremities. Good symmetric motor tone is noted throughout.  Sensory: Sensory testing is intact to pinprick, soft touch, vibration sensation, and position sense on all 4 extremities. No evidence of extinction is noted.  Coordination: Cerebellar testing reveals good finger-nose-finger and heel-to-shin bilaterally.  Gait and station: Gait is slightly wide-based. Tandem gait is unsteady. Romberg is negative, no drift is seen.  Reflexes: Deep tendon reflexes are symmetric and normal bilaterally. Toes are downgoing bilaterally.   Assessment/Plan:  1. Memory disorder  2. Reported gait disorder, urinary incontinence  The patient has had a  progressive problem with memory. The patient also reports some issues with a gait disturbance, and difficulty controlling the bladder. The patient will be sent for blood work today, and she will have MRI evaluation of the brain to exclude the possibility of cerebrovascular disease or normal pressure hydrocephalus. The patient will followup in about 4 months. We will consider initiating medications for memory such as Aricept once the above workup has been completed.  Marlan Palau MD 01/27/2014 8:14 PM  [facility] [address] [place], [place] [address]  Phone [phone] Fax [phone]

## 2014-01-27 NOTE — Patient Instructions (Signed)

## 2014-01-28 ENCOUNTER — Telehealth: Payer: Self-pay | Admitting: *Deleted

## 2014-01-28 LAB — VITAMIN B12: Vitamin B-12: 695 pg/mL (ref 211–946)

## 2014-01-28 LAB — RPR: RPR: NONREACTIVE

## 2014-01-28 NOTE — Telephone Encounter (Signed)
Please call the patient. The blood work results are unremarkable. Thank you.  

## 2014-02-20 ENCOUNTER — Ambulatory Visit
Admission: RE | Admit: 2014-02-20 | Discharge: 2014-02-20 | Disposition: A | Discharge status: Home / Self Care | Payer: Medicare Other | Source: Ambulatory Visit | Attending: Neurology | Admitting: Neurology

## 2014-02-20 DIAGNOSIS — R413 Other amnesia: Secondary | ICD-10-CM

## 2014-02-21 ENCOUNTER — Telehealth: Payer: Self-pay | Admitting: Neurology

## 2014-02-21 ENCOUNTER — Encounter (INDEPENDENT_AMBULATORY_CARE_PROVIDER_SITE_OTHER): Payer: Medicare Other | Admitting: Ophthalmology

## 2014-02-21 MED ORDER — DONEPEZIL HCL 5 MG PO TABS: 5.0000 mg | ORAL_TABLET | Freq: Every day | ORAL | Status: DC

## 2014-02-21 NOTE — Telephone Encounter (Signed)
I called patient. I discussed the MRI results with her. She is amenable to starting Aricept, I will call in a prescription.   MRI brain 02/21/2014:  Impression   Unremarkable MRI scan of the brain showing mild  age-appropriate changes of chronic microvascular ischemia and generalized  cerebral atrophy

## 2014-03-02 ENCOUNTER — Telehealth: Payer: Self-pay | Admitting: Neurology

## 2014-03-02 NOTE — Telephone Encounter (Signed)
Patient stated donepezil (ARICEPT) 5 MG tablet causing horrible Nightmares.  Unable to tolerate medication.  Please call and advise.

## 2014-03-02 NOTE — Telephone Encounter (Signed)
I called patient. The patient is unable to tolerate Aricept. She'll stop medication. We may consider Namenda instead. The patient is to contact our office if she desires to go on this medication.

## 2014-03-02 NOTE — Telephone Encounter (Signed)
I called back.  Patient said since the first day she took Aricept, she began have nightmares.  Says she wakes up terrified and does not wish to take this medication any longer.  Indicates other than adding Aricept, nothing else has changed since nightmares began.  She would like a message sent to the provider regarding this.  Please advise.  Thank you.

## 2014-03-04 ENCOUNTER — Telehealth: Payer: Self-pay | Admitting: Neurology

## 2014-03-04 MED ORDER — MEMANTINE HCL 28 X 5 MG & 21 X 10 MG PO TABS: ORAL_TABLET | ORAL | Status: DC

## 2014-03-04 NOTE — Telephone Encounter (Signed)
Patient requesting Rx for Namenda.  Please call anytime and may leave detailed message on voicemail.

## 2014-03-04 NOTE — Telephone Encounter (Signed)
I have signed the order for namenda

## 2014-03-04 NOTE — Telephone Encounter (Signed)
Per last phone note, patient discontinued Aricept, and we may consider starting her on Namenda.  Patient has now called back asking to start Namenda.  Dr Anne HahnWillis is out of the office, [facility]

## 2014-03-07 ENCOUNTER — Encounter (INDEPENDENT_AMBULATORY_CARE_PROVIDER_SITE_OTHER): Payer: Medicare Other | Admitting: Ophthalmology

## 2014-03-11 ENCOUNTER — Encounter (INDEPENDENT_AMBULATORY_CARE_PROVIDER_SITE_OTHER): Payer: Medicare Other | Admitting: Ophthalmology

## 2014-03-11 DIAGNOSIS — H43813 Vitreous degeneration, bilateral: Secondary | ICD-10-CM

## 2014-03-11 DIAGNOSIS — H3531 Nonexudative age-related macular degeneration: Secondary | ICD-10-CM | POA: Diagnosis not present

## 2014-03-11 DIAGNOSIS — I10 Essential (primary) hypertension: Secondary | ICD-10-CM | POA: Diagnosis not present

## 2014-03-11 DIAGNOSIS — H59031 Cystoid macular edema following cataract surgery, right eye: Secondary | ICD-10-CM

## 2014-03-11 DIAGNOSIS — H35033 Hypertensive retinopathy, bilateral: Secondary | ICD-10-CM | POA: Diagnosis not present

## 2014-03-11 DIAGNOSIS — H2702 Aphakia, left eye: Secondary | ICD-10-CM

## 2014-03-20 ENCOUNTER — Other Ambulatory Visit: Payer: Self-pay | Admitting: Internal Medicine

## 2014-03-26 ENCOUNTER — Other Ambulatory Visit: Payer: Self-pay | Admitting: Nurse Practitioner

## 2014-03-27 ENCOUNTER — Other Ambulatory Visit: Payer: Self-pay | Admitting: Internal Medicine

## 2014-03-28 DIAGNOSIS — H02401 Unspecified ptosis of right eyelid: Secondary | ICD-10-CM | POA: Diagnosis not present

## 2014-03-30 ENCOUNTER — Encounter: Payer: Self-pay | Admitting: Neurology

## 2014-04-04 DIAGNOSIS — H16223 Keratoconjunctivitis sicca, not specified as Sjogren's, bilateral: Secondary | ICD-10-CM | POA: Diagnosis not present

## 2014-04-04 DIAGNOSIS — H40033 Anatomical narrow angle, bilateral: Secondary | ICD-10-CM | POA: Diagnosis not present

## 2014-04-05 ENCOUNTER — Encounter: Payer: Self-pay | Admitting: Neurology

## 2014-04-11 ENCOUNTER — Other Ambulatory Visit: Payer: Self-pay | Admitting: Internal Medicine

## 2014-04-14 ENCOUNTER — Encounter: Payer: Self-pay | Admitting: Nurse Practitioner

## 2014-04-14 ENCOUNTER — Ambulatory Visit (INDEPENDENT_AMBULATORY_CARE_PROVIDER_SITE_OTHER): Payer: Medicare Other | Admitting: Nurse Practitioner

## 2014-04-14 VITALS — BP 138/82 | HR 103 | Temp 97.8°F | Resp 20 | Ht <= 58 in | Wt 215.8 lb

## 2014-04-14 DIAGNOSIS — R911 Solitary pulmonary nodule: Secondary | ICD-10-CM

## 2014-04-14 DIAGNOSIS — R05 Cough: Secondary | ICD-10-CM | POA: Diagnosis not present

## 2014-04-14 DIAGNOSIS — I10 Essential (primary) hypertension: Secondary | ICD-10-CM

## 2014-04-14 DIAGNOSIS — R413 Other amnesia: Secondary | ICD-10-CM | POA: Diagnosis not present

## 2014-04-14 DIAGNOSIS — E039 Hypothyroidism, unspecified: Secondary | ICD-10-CM | POA: Diagnosis not present

## 2014-04-14 DIAGNOSIS — R059 Cough, unspecified: Secondary | ICD-10-CM

## 2014-04-14 MED ORDER — MEMANTINE HCL ER 28 MG PO CP24: 28.0000 mg | ORAL_CAPSULE | Freq: Every day | ORAL | Status: DC

## 2014-04-14 MED ORDER — LOSARTAN POTASSIUM 25 MG PO TABS: 50.0000 mg | ORAL_TABLET | Freq: Every day | ORAL | Status: DC

## 2014-04-14 NOTE — Progress Notes (Signed)
Patient ID: Jody OsierJessie D Porter, female   DOB: 09/04/1935, 78 y.o.   MRN: [id number]    PCP: Oneal GroutPANDEY, MAHIMA, MD  Allergies  Allergen Reactions  . Sugar-Protein-Starch Shortness Of Breath  . Aricept [Donepezil Hcl]     Nightmares  . Ciprofloxacin   . Diazepam   . Penicillins   . Prednisone     Marked mentation/ mood change  . Sudafed  [Pseudoephedrine Hcl]     Hyperactive and agitated  . Acyclovir Rash    Chief Complaint  Patient presents with  . Medical Management of Chronic Issues     HPI: Patient is a 78 y.o. female seen in the office today to discussed chest CT which she had done early (back in sept 2015). CT recommends follow up in 6 months. Pt reports worsening cough.   Currently taking synthroid 125 mcg daily (reports she is putting together 2 of the 50s and 1 of the 25 to make 125 mcg)  Blood pressure at home today was 138/69  Neurology gave a titration pack of namenda XR, completes tomorrow, needs refill   Reports on going fatigue   Review of Systems:  Review of Systems  Constitutional: Positive for fatigue. Negative for activity change, appetite change and unexpected weight change.  HENT: Negative for congestion and hearing loss.   Eyes: Negative.   Respiratory: Positive for cough. Negative for shortness of breath.   Cardiovascular: Negative for chest pain, palpitations and leg swelling.  Gastrointestinal: Negative for abdominal pain, diarrhea and constipation.  Genitourinary: Negative for dysuria and difficulty urinating.  Musculoskeletal: Negative for myalgias and arthralgias.  Skin: Negative for color change and wound.  Neurological: Negative for dizziness and weakness.  Psychiatric/Behavioral: Positive for confusion. Negative for behavioral problems and agitation.    Past Medical History  Diagnosis Date  . LBP (low back pain)   . Depression   . Hypertension   . GERD (gastroesophageal reflux disease)   . Hypothyroid   . Hyperparathyroidism   .  Insomnia     w/ sleep apnea NPSG 07/13/2009 AHI 3.5, RDI 29.9/hr  . Allergic rhinitis   . Anxiety   . Celiac disease   . Memory difficulties 01/27/2014  . Obesity   . Degenerative arthritis   . Macular edema     OD   Past Surgical History  Procedure Laterality Date  . Lumbar disc surgery    . Parathyroidectomy    . Cholecystectomy    . Knee surgery Right     reports multiple surgery on knee  . Carpel tunnel Bilateral   . Cesarean section    . Cataracts     Social History:   reports that she has never smoked. She has never used smokeless tobacco. She reports that she does not drink alcohol or use illicit drugs.  Family History  Problem Relation Age of Onset  . Cancer Mother   . Seizures Mother     grand mal  . Cancer Father     prostate  . Cancer Paternal Grandmother   . Cancer Maternal Grandmother   . Arthritis/Rheumatoid Sister     Medications: Patient's Medications  New Prescriptions   No medications on file  Previous Medications   BIOTIN 1000 MCG TABLET    Take 1,000 mcg by mouth daily.     DIPHENHYDRAMINE (BENADRYL) 25 MG CAPSULE    Take 25 mg by mouth at bedtime as needed.    DULOXETINE (CYMBALTA) 60 MG CAPSULE    TAKE ONE TABLET BY  MOUTH DAILY   DUREZOL 0.05 % EMUL    Place 1 drop into the right eye daily.   FLUOROMETHOLONE (FML) 0.1 % OPHTHALMIC SUSPENSION    Place 1 drop into the right eye daily.   IBUPROFEN (ADVIL,MOTRIN) 200 MG TABLET    Take 200 mg by mouth every 6 (six) hours as needed.   LEVOTHYROXINE (SYNTHROID, LEVOTHROID) 125 MCG TABLET    TAKE 1 TABLET (125 MCG TOTAL) BY MOUTH DAILY.   LISINOPRIL (PRINIVIL,ZESTRIL) 5 MG TABLET    Take 1 tablet (5 mg total) by mouth daily.   MEMANTINE (NAMENDA TITRATION PACK) TABLET PACK    Please follow package directions   PANTOPRAZOLE (PROTONIX) 40 MG TABLET    TAKE 1 TABLET BY MOUTH EVERY DAY  Modified Medications   No medications on file  Discontinued Medications   LEVOTHYROXINE (SYNTHROID, LEVOTHROID) 25 MCG  TABLET    TAKE 1 TAB EVERY MORNING WITH FULL GLASS OF WATER 1 HOUR BEFORE EATING OR DRINKING     Physical Exam:  Filed Vitals:   04/14/14 1418  BP: 138/82  Pulse: 103  Temp: 97.8 F (36.6 C)  TempSrc: Oral  Resp: 20  Height: 4' 9.5" (1.461 m)  Weight: 215 lb 12.8 oz (97.886 kg)  SpO2: 93%    Physical Exam  Constitutional: She is oriented to person, place, and time.  Obese female  HENT:  Head: Normocephalic and atraumatic.  Eyes: EOM are normal. Pupils are equal, round, and reactive to light.  Neck: Neck supple. No JVD present.  Cardiovascular: Normal rate, regular rhythm, normal heart sounds and intact distal pulses.   Pulmonary/Chest: Effort normal and breath sounds normal. No respiratory distress.  Abdominal: Soft. Bowel sounds are normal. She exhibits no distension and no mass. There is no tenderness.  Musculoskeletal: Normal range of motion. She exhibits no edema or tenderness.  Gait appears steady  Neurological: She is alert and oriented to person, place, and time. No cranial nerve deficit. Coordination normal.  Skin: Skin is warm and dry.  Psychiatric: She has a normal mood and affect.  Tangential    Labs reviewed: Basic Metabolic Panel:  Recent Labs  16/10/96 1506 11/16/13 1720  NA 140 139  K 4.1 4.3  CL 99 101  CO2 22 23  GLUCOSE 110* 111*  BUN 14 14  CREATININE 0.78 0.89  CALCIUM 9.3 9.5  TSH 8.010* 7.840*   Liver Function Tests:  Recent Labs  04/14/13 1506 11/16/13 1720  AST 15 20  ALT 12 18  ALKPHOS 116 130*  BILITOT <0.2 <0.2  PROT 6.6 6.7   No results for input(s): LIPASE, AMYLASE in the last 8760 hours. No results for input(s): AMMONIA in the last 8760 hours. CBC:  Recent Labs  11/16/13 1720  WBC 7.1  NEUTROABS 4.5  HGB 13.4  HCT 40.8  MCV 83   Lipid Panel: No results for input(s): CHOL, HDL, LDLCALC, TRIG, CHOLHDL, LDLDIRECT in the last 8760 hours. TSH:  Recent Labs  04/14/13 1506 11/16/13 1720  TSH 8.010* 7.840*    A1C: Lab Results  Component Value Date   HGBA1C 6.4* 12/09/2012  EXAM: CT CHEST WITH CONTRAST  TECHNIQUE: Multidetector CT imaging of the chest was performed during intravenous contrast administration.  CONTRAST: 75mL OMNIPAQUE IOHEXOL 300 MG/ML IV.  COMPARISON: PET-CT 12/22/2013. Visualized lung bases on CT abdomen and pelvis 12/15/2013.  FINDINGS: No significant interval change in size or appearance of the nodule in the medial right lower lobe beneath the right hilum, adjacent  to the right superior pulmonary vein; the nodule currently measures approximately 1.8 x 2.5 x 2.0 cm (series 2, image 30 and coronal image 56), previously 2.0 x 2.4 x 2.0 cm. Vague 4 mm nodule in the posterior right middle lobe adjacent to the major fissure (series 3, image 28), unchanged. No new or enlarging nodules elsewhere in either lung. Geographic triangular-shaped area of consolidation in the medial right lower lobe, unchanged, not hypermetabolic on prior PET-CT. Atelectasis involving the deep posterior lower lobes. Lungs otherwise clear. No pleural effusions. Central airways patent without significant bronchial wall thickening.  Heart mildly enlarged but stable. Mitral annular calcification. Mild LAD coronary calcification. No pericardial effusion. Mild atherosclerosis involving the thoracic and upper abdominal aorta.  No significant mediastinal, hilar, or axillary lymphadenopathy. Thyroid gland unremarkable apart from a macroscopic calcification in the inferior right lobe.  Diffuse steatosis involving the visualized liver. Very small hiatal hernia. Moderate to severe pancreatic atrophy with fatty replacement. Bone window images demonstrate diffuse thoracic spondylosis.  IMPRESSION: 1. Stable approximate 2.5 cm nodule medially in the right lower lobe adjacent to the right superior pulmonary vein. 2. Stable vague 4 mm nodule in the right middle lobe adjacent to the major  fissure, likely a small subpleural lymph node. 3. No new or enlarging nodules in either lung. 4. Stable geographic triangular-shaped consolidation in the medial right lower lobe associated with air bronchograms, likely inflammatory or infectious. 5. Stable mild cardiomegaly. 6. Diffuse steatosis involving the visualized liver. 7. Very small hiatal hernia. 8. Moderate to severe pancreatic atrophy. Follow-up CT chest is recommended in 6 months to confirm continued stability. This recommendation follows the consensus statement: Guidelines for Management of Small Pulmonary Nodules Detected on CT Scans: A Statement from the Fleischner Society as published in Radiology 2005; 237:395-400. Marland Kitchen.  Assessment/Plan  1. Essential hypertension With dry cough, possible side effect from ACE will stop lisinopril and start Cozaar at this time.  - losartan (COZAAR) 25 MG tablet; Take 2 tablets (50 mg total) by mouth daily.  Dispense: 30 tablet; Refill: 3 - Comprehensive metabolic panel  2. Hypothyroidism, unspecified hypothyroidism type -taking synthroid 125 mcgs, will follow up TSH  3. Memory difficulties Following with neurology which started her on namenda, has finished the titration without difficulties. Will send Rx to pharmacy at this time.  - Memantine HCl ER 28 MG CP24; Take 28 mg by mouth daily.  Dispense: 30 capsule; Refill: 2  4. Cough -long standing dry cough, will stop ACE and start ARB to see if this helps.   5. Solitary pulmonary nodule -unchanged, will follow up CT in 6 months    Will follow up in 4 months (and order CT scan at that time)

## 2014-04-14 NOTE — Patient Instructions (Signed)
STOP lisinopril could be causing the cough, will start losartan daily   Follow up in ~4 months for routine follow up and order for CT scan

## 2014-04-15 LAB — COMPREHENSIVE METABOLIC PANEL
ALK PHOS: 123 IU/L — AB (ref 39–117)
ALT: 16 IU/L (ref 0–32)
AST: 16 IU/L (ref 0–40)
Albumin/Globulin Ratio: 1.4 (ref 1.1–2.5)
Albumin: 3.9 g/dL (ref 3.5–4.8)
BUN/Creatinine Ratio: 20 (ref 11–26)
BUN: 17 mg/dL (ref 8–27)
CALCIUM: 9.6 mg/dL (ref 8.7–10.3)
CHLORIDE: 102 mmol/L (ref 97–108)
CO2: 24 mmol/L (ref 18–29)
Creatinine, Ser: 0.83 mg/dL (ref 0.57–1.00)
GFR calc Af Amer: 79 mL/min/{1.73_m2} (ref >59)
GFR calc non Af Amer: 69 mL/min/{1.73_m2} (ref >59)
GLOBULIN, TOTAL: 2.7 g/dL (ref 1.5–4.5)
GLUCOSE: 121 mg/dL — AB (ref 65–99)
Potassium: 4.5 mmol/L (ref 3.5–5.2)
SODIUM: 141 mmol/L (ref 134–144)
TOTAL PROTEIN: 6.6 g/dL (ref 6.0–8.5)

## 2014-04-15 LAB — TSH: TSH: 0.183 u[IU]/mL — ABNORMAL LOW (ref 0.450–4.500)

## 2014-04-19 ENCOUNTER — Other Ambulatory Visit: Payer: Self-pay | Admitting: *Deleted

## 2014-04-19 DIAGNOSIS — E032 Hypothyroidism due to medicaments and other exogenous substances: Secondary | ICD-10-CM

## 2014-04-19 MED ORDER — LEVOTHYROXINE SODIUM 112 MCG PO TABS: 112.0000 ug | ORAL_TABLET | Freq: Every day | ORAL | Status: DC

## 2014-04-23 DIAGNOSIS — H43813 Vitreous degeneration, bilateral: Secondary | ICD-10-CM | POA: Diagnosis not present

## 2014-05-02 DIAGNOSIS — H02401 Unspecified ptosis of right eyelid: Secondary | ICD-10-CM | POA: Diagnosis not present

## 2014-05-03 ENCOUNTER — Other Ambulatory Visit: Payer: Self-pay | Admitting: Internal Medicine

## 2014-05-09 ENCOUNTER — Encounter: Payer: Self-pay | Admitting: Internal Medicine

## 2014-05-09 ENCOUNTER — Ambulatory Visit: Payer: Self-pay | Admitting: Nurse Practitioner

## 2014-05-09 DIAGNOSIS — Z0289 Encounter for other administrative examinations: Secondary | ICD-10-CM

## 2014-05-20 ENCOUNTER — Encounter (INDEPENDENT_AMBULATORY_CARE_PROVIDER_SITE_OTHER): Payer: Medicare Other | Admitting: Ophthalmology

## 2014-05-31 ENCOUNTER — Ambulatory Visit: Payer: Medicare Other | Admitting: Adult Health

## 2014-06-09 ENCOUNTER — Other Ambulatory Visit: Payer: Self-pay

## 2014-06-09 ENCOUNTER — Telehealth: Payer: Self-pay | Admitting: *Deleted

## 2014-06-09 ENCOUNTER — Encounter: Payer: Self-pay | Admitting: Neurology

## 2014-06-09 NOTE — Telephone Encounter (Signed)
I will write a letter.

## 2014-06-10 NOTE — Telephone Encounter (Signed)
Letter has been written and faxed to senior Benefits services and confirmation received.

## 2014-06-12 ENCOUNTER — Other Ambulatory Visit: Payer: Self-pay | Admitting: Neurology

## 2014-08-15 ENCOUNTER — Ambulatory Visit: Payer: Medicare Other | Admitting: Nurse Practitioner

## 2014-08-24 ENCOUNTER — Telehealth: Payer: Self-pay | Admitting: Nurse Practitioner

## 2014-08-24 NOTE — Telephone Encounter (Deleted)
I called [name] on 08/24/2014 to reschedule her follow up tha was missed

## 2014-08-24 NOTE — Telephone Encounter (Signed)
I called Jody Porter on 08/24/2014 to reschedule her follow up appointment that she had missed on 08/15/2014. [name] stated that she was having problems with her insurance and had to get that straight before she made another appointment. She stated that she would call back at a later date.

## 2014-09-19 ENCOUNTER — Other Ambulatory Visit: Payer: Self-pay | Admitting: Nurse Practitioner

## 2014-10-13 ENCOUNTER — Other Ambulatory Visit: Payer: Self-pay | Admitting: Nurse Practitioner

## 2014-10-15 ENCOUNTER — Other Ambulatory Visit: Payer: Self-pay | Admitting: Internal Medicine

## 2015-02-03 ENCOUNTER — Emergency Department (HOSPITAL_COMMUNITY)
Admission: EM | Admit: 2015-02-03 | Discharge: 2015-02-03 | Disposition: A | Discharge status: Home / Self Care | Payer: Medicare (Managed Care) | Attending: Emergency Medicine | Admitting: Emergency Medicine

## 2015-02-03 ENCOUNTER — Encounter (HOSPITAL_COMMUNITY): Payer: Self-pay | Admitting: Vascular Surgery

## 2015-02-03 DIAGNOSIS — Y9389 Activity, other specified: Secondary | ICD-10-CM | POA: Insufficient documentation

## 2015-02-03 DIAGNOSIS — Z880 Allergy status to penicillin: Secondary | ICD-10-CM | POA: Diagnosis not present

## 2015-02-03 DIAGNOSIS — Z79899 Other long term (current) drug therapy: Secondary | ICD-10-CM | POA: Diagnosis not present

## 2015-02-03 DIAGNOSIS — Y998 Other external cause status: Secondary | ICD-10-CM | POA: Diagnosis not present

## 2015-02-03 DIAGNOSIS — F419 Anxiety disorder, unspecified: Secondary | ICD-10-CM | POA: Diagnosis not present

## 2015-02-03 DIAGNOSIS — Z8669 Personal history of other diseases of the nervous system and sense organs: Secondary | ICD-10-CM | POA: Insufficient documentation

## 2015-02-03 DIAGNOSIS — E213 Hyperparathyroidism, unspecified: Secondary | ICD-10-CM | POA: Diagnosis not present

## 2015-02-03 DIAGNOSIS — E039 Hypothyroidism, unspecified: Secondary | ICD-10-CM | POA: Diagnosis not present

## 2015-02-03 DIAGNOSIS — K219 Gastro-esophageal reflux disease without esophagitis: Secondary | ICD-10-CM | POA: Insufficient documentation

## 2015-02-03 DIAGNOSIS — S46901A Unspecified injury of unspecified muscle, fascia and tendon at shoulder and upper arm level, right arm, initial encounter: Secondary | ICD-10-CM | POA: Insufficient documentation

## 2015-02-03 DIAGNOSIS — F329 Major depressive disorder, single episode, unspecified: Secondary | ICD-10-CM | POA: Insufficient documentation

## 2015-02-03 DIAGNOSIS — S4991XA Unspecified injury of right shoulder and upper arm, initial encounter: Secondary | ICD-10-CM

## 2015-02-03 DIAGNOSIS — E669 Obesity, unspecified: Secondary | ICD-10-CM | POA: Insufficient documentation

## 2015-02-03 DIAGNOSIS — Y9289 Other specified places as the place of occurrence of the external cause: Secondary | ICD-10-CM | POA: Diagnosis not present

## 2015-02-03 DIAGNOSIS — X58XXXA Exposure to other specified factors, initial encounter: Secondary | ICD-10-CM | POA: Diagnosis not present

## 2015-02-03 DIAGNOSIS — I10 Essential (primary) hypertension: Secondary | ICD-10-CM | POA: Insufficient documentation

## 2015-02-03 HISTORY — DX: Prediabetes: R73.03

## 2015-02-03 HISTORY — DX: Unspecified urinary incontinence: R32

## 2015-02-03 NOTE — ED Provider Notes (Signed)
CSN: [id number]     Arrival date & time 02/03/15  1833 History   First MD Initiated Contact with Patient 02/03/15 2022     Chief Complaint  Patient presents with  . Arm Pain     (Consider location/radiation/quality/duration/timing/severity/associated sxs/prior Treatment) HPI   She presents for evaluation of right arm pain, which occurred last night, when she reached over from her bedside, to pick something up on the floor. She had a sharp pain, which occurred suddenly and has persisted and is worse with movement. She and her family members were worried about a "blood clot" they came here for evaluation. She denies headache, neck pain, back pain, nausea, vomiting, fever or chills. There are no other known modifying factors. She does not take anticoagulate.  Past Medical History  Diagnosis Date  . LBP (low back pain)   . Depression   . Hypertension   . GERD (gastroesophageal reflux disease)   . Hypothyroid   . Hyperparathyroidism   . Insomnia     w/ sleep apnea NPSG 07/13/2009 AHI 3.5, RDI 29.9/hr  . Allergic rhinitis   . Anxiety   . Celiac disease   . Memory difficulties 01/27/2014  . Obesity   . Degenerative arthritis   . Macular edema     OD  . Prediabetes   . Urinary incontinence    Past Surgical History  Procedure Laterality Date  . Lumbar disc surgery    . Parathyroidectomy    . Cholecystectomy    . Knee surgery Right     reports multiple surgery on knee  . Carpel tunnel Bilateral   . Cesarean section    . Cataracts     Family History  Problem Relation Age of Onset  . Cancer Mother   . Seizures Mother     grand mal  . Cancer Father     prostate  . Cancer Paternal Grandmother   . Cancer Maternal Grandmother   . Arthritis/Rheumatoid Sister    Social History  Substance Use Topics  . Smoking status: Never Smoker   . Smokeless tobacco: Never Used  . Alcohol Use: No   OB History    No data available     Review of Systems  All other systems reviewed and  are negative.     Allergies  Sugar-protein-starch; Aricept; Ciprofloxacin; Diazepam; Penicillins; Prednisone; Sudafed ; and Acyclovir  Home Medications   Prior to Admission medications   Medication Sig Start Date End Date Taking? Authorizing Provider  Biotin 1000 MCG tablet Take 1,000 mcg by mouth daily.      Historical Provider, MD  diphenhydrAMINE (BENADRYL) 25 mg capsule Take 25 mg by mouth at bedtime as needed.     Historical Provider, MD  DULoxetine (CYMBALTA) 60 MG capsule TAKE ONE TABLET BY MOUTH DAILY 05/03/14   Mahima Pandey, MD  DUREZOL 0.05 % EMUL Place 1 drop into the right eye daily. 01/10/14   Historical Provider, MD  fluorometholone (FML) 0.1 % ophthalmic suspension Place 1 drop into the right eye daily. 01/24/14   Historical Provider, MD  ibuprofen (ADVIL,MOTRIN) 200 MG tablet Take 200 mg by mouth every 6 (six) hours as needed.    Historical Provider, MD  levothyroxine (SYNTHROID, LEVOTHROID) 112 MCG tablet TAKE 1 TABLET (112 MCG TOTAL) BY MOUTH DAILY. 09/19/14   Sharon Seller, NP  losartan (COZAAR) 25 MG tablet Take 2 tablets (50 mg total) by mouth daily. 04/14/14   Sharon Seller, NP  Memantine HCl ER 28 MG CP24  Take 28 mg by mouth daily. 04/14/14   Sharon Seller, NP  pantoprazole (PROTONIX) 40 MG tablet TAKE 1 TABLET BY MOUTH EVERY DAY 03/21/14   Mahima Pandey, MD   BP 138/66 mmHg  Pulse 91  Temp(Src) 98 F (36.7 C) (Oral)  Resp 24  SpO2 99% Physical Exam  Constitutional: She is oriented to person, place, and time. She appears well-developed.  Obese  HENT:  Head: Normocephalic and atraumatic.  Right Ear: External ear normal.  Left Ear: External ear normal.  Eyes: Conjunctivae and EOM are normal. Pupils are equal, round, and reactive to light.  Neck: Normal range of motion and phonation normal. Neck supple.  Cardiovascular: Normal rate.   Pulmonary/Chest: Effort normal. She exhibits no bony tenderness.  Musculoskeletal:  Right upper arm- tender in the  anterior aspect diffusely. Bruising noted in the antecubital space. With passive range of motion I am able to elevate the arm above the shoulder, without significant pain. She is also able to hold the arm in this position, without pain. Neurovascular intact distally in the right hand. No swelling in the right forearm or hand.  Neurological: She is alert and oriented to person, place, and time. No cranial nerve deficit or sensory deficit. She exhibits normal muscle tone. Coordination normal.  Skin: Skin is warm, dry and intact.  Psychiatric: She has a normal mood and affect. Her behavior is normal. Judgment and thought content normal.  Nursing note and vitals reviewed.   ED Course  Procedures (including critical care time)  Findings discussed with patient and family members, all questions were answered.  Patient offered sling, and declined it. She states she feels that she can move her arm well enough, at this time.  Labs Review Labs Reviewed - No data to display  Imaging Review No results found. I have personally reviewed and evaluated these images and lab results as part of my medical decision-making.   EKG Interpretation None      MDM   Final diagnoses:  Injury of muscle or tendon of upper arm or shoulder, right, initial encounter   Muscular injury, right upper arm, possibly bicep tear. Doubt tendon rupture or fracture.  Nursing Notes Reviewed/ Care Coordinated Applicable Imaging Reviewed Interpretation of Laboratory Data incorporated into ED treatment  The patient appears reasonably screened and/or stabilized for discharge and I doubt any other medical condition or other [facility] requiring further screening, evaluation, or treatment in the ED at this time prior to discharge.  Plan: Home Medications- Tylenol; Home Treatments- heat, rest; return here if the recommended treatment, does not improve the symptoms; Recommended follow up- PCP checkup one week      Mancel Bale,  MD 02/03/15 2328

## 2015-02-03 NOTE — Discharge Instructions (Signed)
Use heat on the sore area 3 or 4 times a day.  Take Tylenol, for pain.

## 2015-02-03 NOTE — ED Notes (Signed)
Pt reports to the ED for eval of right arm pain. She reports she was leaning over to grab a blanket and felt a pop and a then she got a severe sharp pain. Then when she awoke today she noticed some bruising and swelling to the medial aspect of her right upper arm. Pt is not on blood thinners. Pt A&Ox4, resp e/u, and skin warm and dry.

## 2015-02-03 NOTE — ED Notes (Signed)
Pt ambulated to restroom.  Pt has a history of unsteady gait, which was noted while ambulating, but patient independent and able to toilet independently.    Ice provided to pt for arm.

## 2015-02-04 ENCOUNTER — Emergency Department (INDEPENDENT_AMBULATORY_CARE_PROVIDER_SITE_OTHER)
Admission: EM | Admit: 2015-02-04 | Discharge: 2015-02-04 | Disposition: A | Discharge status: Home / Self Care | Payer: Medicare (Managed Care) | Source: Home / Self Care | Attending: Family Medicine | Admitting: Family Medicine

## 2015-02-04 ENCOUNTER — Encounter (HOSPITAL_COMMUNITY): Payer: Self-pay | Admitting: Emergency Medicine

## 2015-02-04 DIAGNOSIS — S46111A Strain of muscle, fascia and tendon of long head of biceps, right arm, initial encounter: Secondary | ICD-10-CM | POA: Diagnosis not present

## 2015-02-04 DIAGNOSIS — S46211A Strain of muscle, fascia and tendon of other parts of biceps, right arm, initial encounter: Secondary | ICD-10-CM

## 2015-02-04 MED ORDER — TRAMADOL HCL 50 MG PO TABS: 50.0000 mg | ORAL_TABLET | Freq: Four times a day (QID) | ORAL | Status: DC | PRN

## 2015-02-04 NOTE — ED Notes (Signed)
Patient seen in ed 9/23 and told patient to follow up with pcp.  Patient reports pulled on blanket and felt pop in right arm.  Noted bruising and went to the ed for pain and bruising.  Today reports bruising has extended into forearm.

## 2015-02-04 NOTE — Discharge Instructions (Signed)
Sling for comfort, ice until Monday then heat 2-3 times a day. See your doctor or return as needed.

## 2015-02-04 NOTE — ED Provider Notes (Signed)
CSN: [id number]     Arrival date & time 02/04/15  1309 History   First MD Initiated Contact with Patient 02/04/15 1445     Chief Complaint  Patient presents with  . Arm Injury   (Consider location/radiation/quality/duration/timing/severity/associated sxs/prior Treatment) Patient is a 79 y.o. female presenting with arm injury. The history is provided by the patient and a relative.  Arm Injury Location:  Elbow Time since incident:  12 hours Injury: yes   Mechanism of injury comment:  Lifted a blanket, felt pop in right elbow, bruising since. Elbow location:  R elbow   Past Medical History  Diagnosis Date  . LBP (low back pain)   . Depression   . Hypertension   . GERD (gastroesophageal reflux disease)   . Hypothyroid   . Hyperparathyroidism   . Insomnia     w/ sleep apnea NPSG 07/13/2009 AHI 3.5, RDI 29.9/hr  . Allergic rhinitis   . Anxiety   . Celiac disease   . Memory difficulties 01/27/2014  . Obesity   . Degenerative arthritis   . Macular edema     OD  . Prediabetes   . Urinary incontinence    Past Surgical History  Procedure Laterality Date  . Lumbar disc surgery    . Parathyroidectomy    . Cholecystectomy    . Knee surgery Right     reports multiple surgery on knee  . Carpel tunnel Bilateral   . Cesarean section    . Cataracts     Family History  Problem Relation Age of Onset  . Cancer Mother   . Seizures Mother     grand mal  . Cancer Father     prostate  . Cancer Paternal Grandmother   . Cancer Maternal Grandmother   . Arthritis/Rheumatoid Sister    Social History  Substance Use Topics  . Smoking status: Never Smoker   . Smokeless tobacco: Never Used  . Alcohol Use: No   OB History    No data available     Review of Systems  Constitutional: Negative.   Musculoskeletal: Positive for myalgias and joint swelling.  Skin: Positive for color change and wound.  All other systems reviewed and are negative.   Allergies  Aricept;  Sugar-protein-starch; Ciprofloxacin; Diazepam; Prednisone; Sudafed; Acyclovir; and Penicillins  Home Medications   Prior to Admission medications   Medication Sig Start Date End Date Taking? Authorizing Provider  Biotin 1000 MCG tablet Take 1,000 mcg by mouth daily.      Historical Provider, MD  diphenhydrAMINE (BENADRYL) 25 mg capsule Take 50-100 mg by mouth at bedtime as needed for itching or allergies.     Historical Provider, MD  DULoxetine (CYMBALTA) 60 MG capsule TAKE ONE TABLET BY MOUTH DAILY 05/03/14   Oneal Grout, MD  ibuprofen (ADVIL,MOTRIN) 200 MG tablet Take 400 mg by mouth every 6 (six) hours as needed for moderate pain.     Historical Provider, MD  levothyroxine (SYNTHROID, LEVOTHROID) 112 MCG tablet TAKE 1 TABLET (112 MCG TOTAL) BY MOUTH DAILY. 09/19/14   Sharon Seller, NP  losartan (COZAAR) 25 MG tablet Take 2 tablets (50 mg total) by mouth daily. Patient taking differently: Take 50 mg by mouth at bedtime.  04/14/14   Sharon Seller, NP  Memantine HCl ER 28 MG CP24 Take 28 mg by mouth daily. 04/14/14   Sharon Seller, NP  metFORMIN (GLUCOPHAGE) 500 MG tablet Take 500 mg by mouth daily. 12/30/14   Historical Provider, MD  pantoprazole (PROTONIX)  40 MG tablet TAKE 1 TABLET BY MOUTH EVERY DAY 03/21/14   Oneal Grout, MD  simvastatin (ZOCOR) 20 MG tablet Take 20 mg by mouth at bedtime. 01/21/15   Historical Provider, MD  traMADol (ULTRAM) 50 MG tablet Take 1 tablet (50 mg total) by mouth every 6 (six) hours as needed. 02/04/15   Linna Hoff, MD  VESICARE 5 MG tablet Take 5 mg by mouth daily. 01/15/15   Historical Provider, MD  Vitamin D, Ergocalciferol, (DRISDOL) 50000 UNITS CAPS capsule Take 50,000 Units by mouth every Friday. 01/21/15   Historical Provider, MD   Meds Ordered and Administered this Visit  Medications - No data to display  BP 160/92 mmHg  Pulse 84  Temp(Src) 97.4 F (36.3 C) (Oral)  Resp 24  SpO2 100% No data found.   Physical Exam  Constitutional:  She is oriented to person, place, and time. She appears well-developed and well-nourished.  Musculoskeletal: She exhibits tenderness.  Neurological: She is alert and oriented to person, place, and time.  Skin: Skin is warm and dry.  Extensive ecchymosis to right elbow, distal nvt intact.  Nursing note and vitals reviewed.   ED Course  Procedures (including critical care time)  Labs Review Labs Reviewed - No data to display  Imaging Review No results found.   Visual Acuity Review  Right Eye Distance:   Left Eye Distance:   Bilateral Distance:    Right Eye Near:   Left Eye Near:    Bilateral Near:         MDM   1. Biceps muscle strain, right, initial encounter    Sling,rx ultram, ice    Linna Hoff, MD 02/04/15 [phone]

## 2015-04-11 IMAGING — CR DG CHEST 2V
2 series · 2 of 2 positions shown · non-contrast
Comparison: Chest radiograph 02/06/2009.

CLINICAL DATA: Chronic cough.

EXAM:
CHEST  2 VIEW

[view not recorded (1 of 2)]
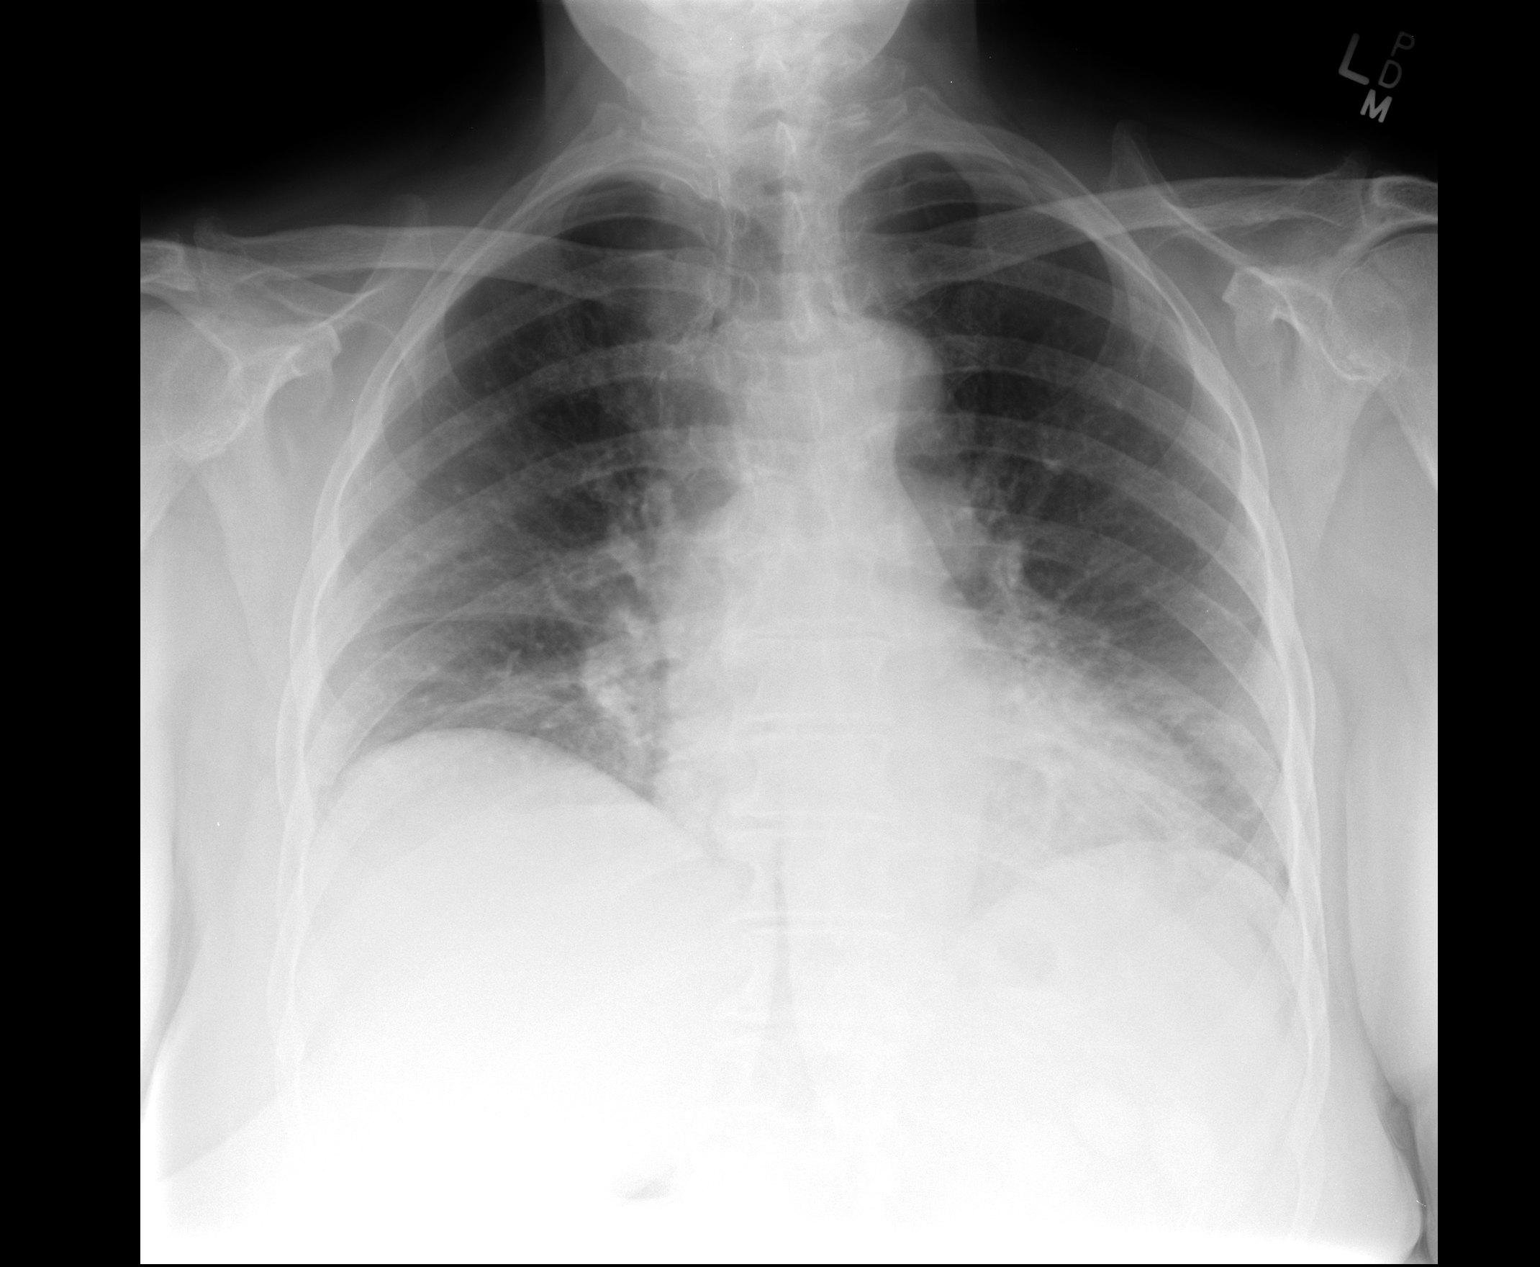

[view not recorded (2 of 2)]
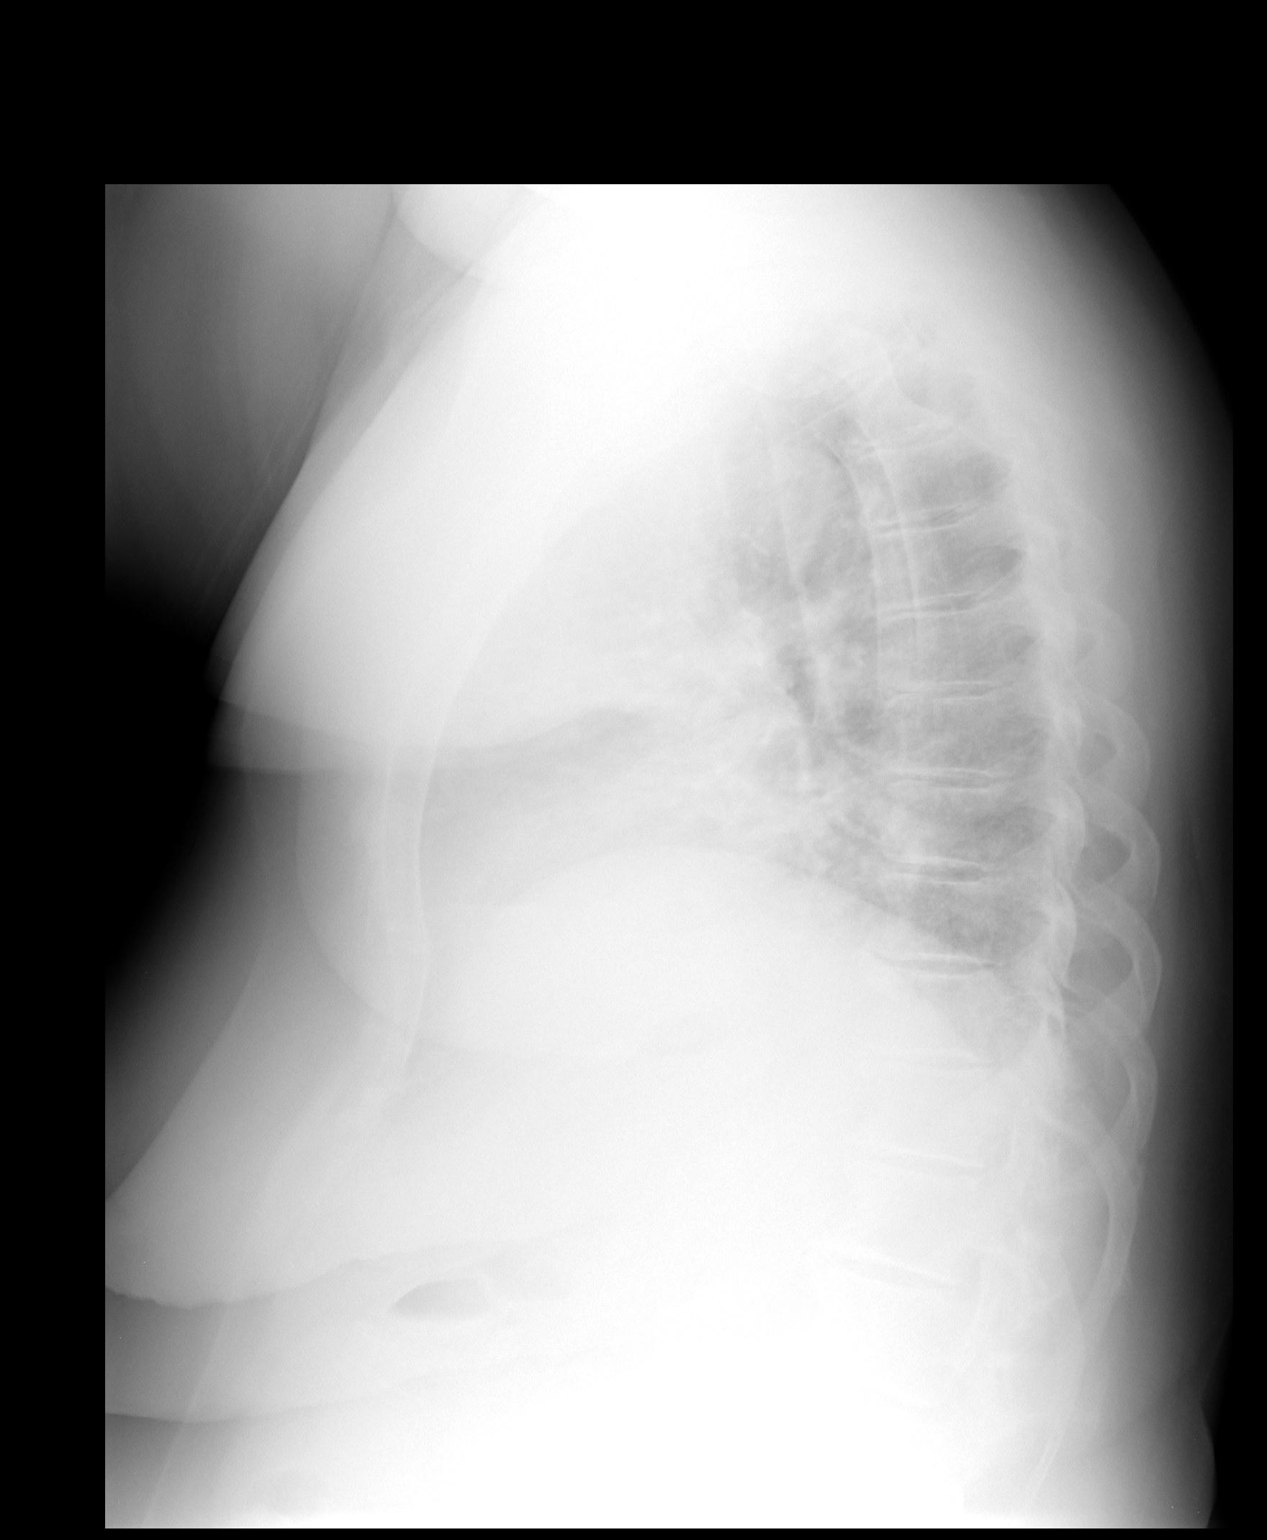

[2 of 2 positions shown; findings below may reference images not displayed]

FINDINGS: Stable cardiac and mediastinal contours. Slight interval increase in
left-greater-than-right basilar heterogeneous opacities. Lower lung
volumes. No pleural effusion or pneumothorax. Regional skeleton is
unremarkable.
IMPRESSION: Lower lung volumes. Increased left-greater-than-right basilar
heterogeneous opacities likely represents atelectasis and
bronchovascular crowding. Infection not excluded.

## 2015-10-17 ENCOUNTER — Emergency Department (HOSPITAL_COMMUNITY): Payer: Medicare (Managed Care)

## 2015-10-17 ENCOUNTER — Emergency Department (HOSPITAL_COMMUNITY)
Admission: EM | Admit: 2015-10-17 | Discharge: 2015-10-17 | Disposition: A | Discharge status: Home / Self Care | Payer: Medicare (Managed Care) | Attending: Emergency Medicine | Admitting: Emergency Medicine

## 2015-10-17 ENCOUNTER — Encounter (HOSPITAL_COMMUNITY): Payer: Self-pay | Admitting: Emergency Medicine

## 2015-10-17 DIAGNOSIS — F32A Depression, unspecified: Secondary | ICD-10-CM

## 2015-10-17 DIAGNOSIS — K219 Gastro-esophageal reflux disease without esophagitis: Secondary | ICD-10-CM | POA: Diagnosis not present

## 2015-10-17 DIAGNOSIS — R41840 Attention and concentration deficit: Secondary | ICD-10-CM | POA: Insufficient documentation

## 2015-10-17 DIAGNOSIS — Z7984 Long term (current) use of oral hypoglycemic drugs: Secondary | ICD-10-CM | POA: Diagnosis not present

## 2015-10-17 DIAGNOSIS — F919 Conduct disorder, unspecified: Secondary | ICD-10-CM | POA: Insufficient documentation

## 2015-10-17 DIAGNOSIS — I10 Essential (primary) hypertension: Secondary | ICD-10-CM | POA: Insufficient documentation

## 2015-10-17 DIAGNOSIS — Z880 Allergy status to penicillin: Secondary | ICD-10-CM | POA: Diagnosis not present

## 2015-10-17 DIAGNOSIS — E669 Obesity, unspecified: Secondary | ICD-10-CM | POA: Diagnosis not present

## 2015-10-17 DIAGNOSIS — E038 Other specified hypothyroidism: Secondary | ICD-10-CM

## 2015-10-17 DIAGNOSIS — F410 Panic disorder [episodic paroxysmal anxiety] without agoraphobia: Secondary | ICD-10-CM | POA: Diagnosis not present

## 2015-10-17 DIAGNOSIS — F1310 Sedative, hypnotic or anxiolytic abuse, uncomplicated: Secondary | ICD-10-CM | POA: Diagnosis not present

## 2015-10-17 DIAGNOSIS — F419 Anxiety disorder, unspecified: Secondary | ICD-10-CM

## 2015-10-17 DIAGNOSIS — Z79899 Other long term (current) drug therapy: Secondary | ICD-10-CM | POA: Insufficient documentation

## 2015-10-17 DIAGNOSIS — F329 Major depressive disorder, single episode, unspecified: Secondary | ICD-10-CM | POA: Diagnosis not present

## 2015-10-17 DIAGNOSIS — M791 Myalgia: Secondary | ICD-10-CM | POA: Diagnosis not present

## 2015-10-17 DIAGNOSIS — G478 Other sleep disorders: Secondary | ICD-10-CM | POA: Diagnosis not present

## 2015-10-17 DIAGNOSIS — R45851 Suicidal ideations: Secondary | ICD-10-CM | POA: Diagnosis present

## 2015-10-17 LAB — COMPREHENSIVE METABOLIC PANEL
ALK PHOS: 94 U/L (ref 38–126)
ALT: 25 U/L (ref 14–54)
AST: 29 U/L (ref 15–41)
Albumin: 3.9 g/dL (ref 3.5–5.0)
Anion gap: 11 (ref 5–15)
BILIRUBIN TOTAL: 0.7 mg/dL (ref 0.3–1.2)
BUN: 16 mg/dL (ref 6–20)
CALCIUM: 9.5 mg/dL (ref 8.9–10.3)
CO2: 20 mmol/L — AB (ref 22–32)
CREATININE: 0.97 mg/dL (ref 0.44–1.00)
Chloride: 107 mmol/L (ref 101–111)
GFR, EST NON AFRICAN AMERICAN: 55 mL/min — AB (ref >60)
Glucose, Bld: 154 mg/dL — ABNORMAL HIGH (ref 65–99)
Potassium: 3.7 mmol/L (ref 3.5–5.1)
Sodium: 138 mmol/L (ref 135–145)
TOTAL PROTEIN: 6.3 g/dL — AB (ref 6.5–8.1)

## 2015-10-17 LAB — RAPID URINE DRUG SCREEN, HOSP PERFORMED
Amphetamines: NOT DETECTED
Barbiturates: NOT DETECTED
Benzodiazepines: POSITIVE — AB
Cocaine: NOT DETECTED
OPIATES: NOT DETECTED
TETRAHYDROCANNABINOL: NOT DETECTED

## 2015-10-17 LAB — CBC
HCT: 41.9 % (ref 36.0–46.0)
HEMOGLOBIN: 13.5 g/dL (ref 12.0–15.0)
MCH: 27.3 pg (ref 26.0–34.0)
MCHC: 32.2 g/dL (ref 30.0–36.0)
MCV: 84.8 fL (ref 78.0–100.0)
Platelets: 214 10*3/uL (ref 150–400)
RBC: 4.94 MIL/uL (ref 3.87–5.11)
RDW: 14.7 % (ref 11.5–15.5)
WBC: 9.6 10*3/uL (ref 4.0–10.5)

## 2015-10-17 LAB — URINALYSIS, ROUTINE W REFLEX MICROSCOPIC
BILIRUBIN URINE: NEGATIVE
Glucose, UA: NEGATIVE mg/dL
Hgb urine dipstick: NEGATIVE
KETONES UR: NEGATIVE mg/dL
NITRITE: NEGATIVE
PROTEIN: NEGATIVE mg/dL
Specific Gravity, Urine: 1.014 (ref 1.005–1.030)
pH: 7.5 (ref 5.0–8.0)

## 2015-10-17 LAB — URINE MICROSCOPIC-ADD ON

## 2015-10-17 LAB — ACETAMINOPHEN LEVEL
Acetaminophen (Tylenol), Serum: 10 ug/mL (ref 10–30)
Acetaminophen (Tylenol), Serum: <10 ug/mL — ABNORMAL LOW (ref 10–30)

## 2015-10-17 LAB — ETHANOL

## 2015-10-17 LAB — CK: CK TOTAL: 156 U/L (ref 38–234)

## 2015-10-17 LAB — T4, FREE: FREE T4: 0.74 ng/dL (ref 0.61–1.12)

## 2015-10-17 LAB — TSH: TSH: 5.298 u[IU]/mL — ABNORMAL HIGH (ref 0.350–4.500)

## 2015-10-17 LAB — SALICYLATE LEVEL

## 2015-10-17 MED ORDER — ALPRAZOLAM 0.25 MG PO TABS
0.5000 mg | ORAL_TABLET | Freq: Once | ORAL | Status: AC
  Administered 2015-10-17: 0.5 mg via ORAL
  Filled 2015-10-17: qty 2

## 2015-10-17 MED ORDER — SODIUM CHLORIDE 0.9 % IV BOLUS (SEPSIS): 1000.0000 mL | Freq: Once | INTRAVENOUS | Status: DC

## 2015-10-17 MED ORDER — METFORMIN HCL 500 MG PO TABS: 500.0000 mg | ORAL_TABLET | Freq: Every day | ORAL | Status: DC

## 2015-10-17 MED ORDER — LOSARTAN POTASSIUM 50 MG PO TABS
50.0000 mg | ORAL_TABLET | Freq: Every day | ORAL | Status: DC
  Filled 2015-10-17: qty 1

## 2015-10-17 MED ORDER — INSULIN ASPART 100 UNIT/ML ~~LOC~~ SOLN: 0.0000 [IU] | Freq: Three times a day (TID) | SUBCUTANEOUS | Status: DC

## 2015-10-17 MED ORDER — LEVOTHYROXINE SODIUM 112 MCG PO TABS: 112.0000 ug | ORAL_TABLET | Freq: Every day | ORAL | Status: DC

## 2015-10-17 MED ORDER — MEMANTINE HCL ER 28 MG PO CP24
28.0000 mg | ORAL_CAPSULE | Freq: Every day | ORAL | Status: DC
  Filled 2015-10-17: qty 1

## 2015-10-17 MED ORDER — PANTOPRAZOLE SODIUM 40 MG PO TBEC: 40.0000 mg | DELAYED_RELEASE_TABLET | Freq: Every day | ORAL | Status: DC

## 2015-10-17 MED ORDER — SIMVASTATIN 20 MG PO TABS
20.0000 mg | ORAL_TABLET | Freq: Every day | ORAL | Status: DC
  Filled 2015-10-17: qty 1

## 2015-10-17 MED ORDER — ONDANSETRON HCL 4 MG PO TABS: 4.0000 mg | ORAL_TABLET | Freq: Three times a day (TID) | ORAL | Status: DC | PRN

## 2015-10-17 MED ORDER — DULOXETINE HCL 60 MG PO CPEP: 60.0000 mg | ORAL_CAPSULE | Freq: Every day | ORAL | Status: DC

## 2015-10-17 MED ORDER — LORAZEPAM 1 MG PO TABS
1.0000 mg | ORAL_TABLET | Freq: Once | ORAL | Status: DC
  Filled 2015-10-17: qty 1

## 2015-10-17 NOTE — ED Notes (Signed)
Pt transporting to CT  

## 2015-10-17 NOTE — ED Notes (Signed)
MD at bedside. 

## 2015-10-17 NOTE — ED Notes (Signed)
Pt changed into  [place] scrubs 

## 2015-10-17 NOTE — ED Notes (Signed)
All belongings returned to pt and family on d/c.  A x 4, NAD.  VSS.

## 2015-10-17 NOTE — ED Notes (Signed)
Sandwich and drink given to pt, ok per MD.

## 2015-10-17 NOTE — ED Notes (Addendum)
Security at bedside to wand pt. 

## 2015-10-17 NOTE — ED Notes (Addendum)
Pt reports severe claustophobia and unable to do MRI, MD aware, medications ordered, pt refusing, states "I have to be unconscious in order to do the scan".  MD aware, canceled MRI.

## 2015-10-17 NOTE — ED Notes (Signed)
Staffing notified about the need for a sitter.

## 2015-10-17 NOTE — ED Notes (Signed)
TTS in progress 

## 2015-10-17 NOTE — ED Notes (Signed)
Security at bedside to wand pt. 

## 2015-10-17 NOTE — ED Provider Notes (Signed)
CSN: [id number]     Arrival date & time 10/17/15  0848 History   First MD Initiated Contact with Patient 10/17/15 1120     Chief Complaint  Patient presents with  . Suicidal     (Consider location/radiation/quality/duration/timing/severity/associated sxs/prior Treatment) HPI Comments: Patient presents from home with anxiety, panic attacks and crying spells. Granddaughter states she woke up this morning crying and tearful. Patient states she's had thoughts of wanting to hurt herself. She does not have a plan. No homicidal thoughts. Patient states she's not felt well for 2 months and had generalized weakness, frequent falls, diffuse body aches and "shaking all over". She states she has difficulty walking but denies dizziness. She denies any syncope or passing out episodes. Denies any dizziness or lightheadedness. Denies any chest pain or shortness of breath. Denies any bowel or bladder incontinence. Denies any focal weakness, numbness or tingling. Notably she stopped her Synthroid on her own about 2 months ago and has felt worse since then. Has had a poor appetite with nausea but no vomiting and no constipation. His been prescribed Xanax for anxiety but does not take on a regular basis. Denies any alcohol or drug abuse.  The history is provided by the patient and a relative.    Past Medical History  Diagnosis Date  . LBP (low back pain)   . Depression   . Hypertension   . GERD (gastroesophageal reflux disease)   . Hypothyroid   . Hyperparathyroidism   . Insomnia     w/ sleep apnea NPSG 07/13/2009 AHI 3.5, RDI 29.9/hr  . Allergic rhinitis   . Anxiety   . Celiac disease   . Memory difficulties 01/27/2014  . Obesity   . Degenerative arthritis   . Macular edema     OD  . Prediabetes   . Urinary incontinence    Past Surgical History  Procedure Laterality Date  . Lumbar disc surgery    . Parathyroidectomy    . Cholecystectomy    . Knee surgery Right     reports multiple surgery on knee   . Carpel tunnel Bilateral   . Cesarean section    . Cataracts     Family History  Problem Relation Age of Onset  . Cancer Mother   . Seizures Mother     grand mal  . Cancer Father     prostate  . Cancer Paternal Grandmother   . Cancer Maternal Grandmother   . Arthritis/Rheumatoid Sister    Social History  Substance Use Topics  . Smoking status: Never Smoker   . Smokeless tobacco: Never Used  . Alcohol Use: No   OB History    No data available     Review of Systems  Constitutional: Positive for activity change, appetite change and fatigue. Negative for fever.  HENT: Negative for congestion and rhinorrhea.   Respiratory: Negative for cough, chest tightness and shortness of breath.   Cardiovascular: Negative for chest pain.  Gastrointestinal: Negative for nausea, vomiting and abdominal pain.  Musculoskeletal: Positive for myalgias and arthralgias.  Skin: Negative for rash and wound.  Neurological: Positive for weakness. Negative for dizziness, light-headedness and headaches.  Psychiatric/Behavioral: Positive for suicidal ideas, behavioral problems, sleep disturbance, self-injury, dysphoric mood and decreased concentration. The patient is nervous/anxious.   A complete 10 system review of systems was obtained and all systems are negative except as noted in the HPI and PMH.      Allergies  Aricept; Sugar-protein-starch; Ciprofloxacin; Prednisone; Sudafed; Valium; Acyclovir; and Penicillins  Home Medications   Prior to Admission medications   Medication Sig Start Date End Date Taking? Authorizing Provider  Biotin 1000 MCG tablet Take 1,000 mcg by mouth daily.      Historical Provider, MD  diphenhydrAMINE (BENADRYL) 25 mg capsule Take 50-100 mg by mouth at bedtime as needed for itching or allergies.     Historical Provider, MD  DULoxetine (CYMBALTA) 60 MG capsule TAKE ONE TABLET BY MOUTH DAILY 05/03/14   Oneal Grout, MD  ibuprofen (ADVIL,MOTRIN) 200 MG tablet Take 400 mg  by mouth every 6 (six) hours as needed for moderate pain.     Historical Provider, MD  levothyroxine (SYNTHROID, LEVOTHROID) 112 MCG tablet TAKE 1 TABLET (112 MCG TOTAL) BY MOUTH DAILY. 09/19/14   Sharon Seller, NP  losartan (COZAAR) 25 MG tablet Take 2 tablets (50 mg total) by mouth daily. Patient taking differently: Take 50 mg by mouth at bedtime.  04/14/14   Sharon Seller, NP  Memantine HCl ER 28 MG CP24 Take 28 mg by mouth daily. 04/14/14   Sharon Seller, NP  metFORMIN (GLUCOPHAGE) 500 MG tablet Take 500 mg by mouth daily. 12/30/14   Historical Provider, MD  pantoprazole (PROTONIX) 40 MG tablet TAKE 1 TABLET BY MOUTH EVERY DAY 03/21/14   Oneal Grout, MD  simvastatin (ZOCOR) 20 MG tablet Take 20 mg by mouth at bedtime. 01/21/15   Historical Provider, MD  traMADol (ULTRAM) 50 MG tablet Take 1 tablet (50 mg total) by mouth every 6 (six) hours as needed. 02/04/15   Linna Hoff, MD  VESICARE 5 MG tablet Take 5 mg by mouth daily. 01/15/15   Historical Provider, MD  Vitamin D, Ergocalciferol, (DRISDOL) 50000 UNITS CAPS capsule Take 50,000 Units by mouth every Friday. 01/21/15   Historical Provider, MD   BP 143/87 mmHg  Pulse 88  Temp(Src) 98.6 F (37 C) (Oral)  Resp 22  SpO2 98% Physical Exam  Constitutional: She is oriented to person, place, and time. She appears well-developed and well-nourished. No distress.  Tearful, flat affect   HENT:  Head: Normocephalic and atraumatic.  Mouth/Throat: Oropharynx is clear and moist. No oropharyngeal exudate.  Eyes: Conjunctivae and EOM are normal. Pupils are equal, round, and reactive to light.  Unequal pupils, nonreactive, hx eye surgery  Neck: Normal range of motion. Neck supple.  No meningismus.  Cardiovascular: Normal rate, regular rhythm, normal heart sounds and intact distal pulses.   No murmur heard. Pulmonary/Chest: Effort normal and breath sounds normal. No respiratory distress.  Abdominal: Soft. There is no tenderness. There is no  rebound and no guarding.  Musculoskeletal: Normal range of motion. She exhibits no edema or tenderness.  5/5 strength in bilateral lower extremities. Ankle plantar and dorsiflexion intact. Great toe extension intact bilaterally. +2 DP and PT pulses. Normal gait.  Postoperative changes R knee  Neurological: She is alert and oriented to person, place, and time. No cranial nerve deficit. She exhibits normal muscle tone. Coordination normal.  No ataxia on finger to nose bilaterally. No pronator drift. 5/5 strength throughout. CN 2-12 intact.Equal grip strength. Sensation intact.  Normal gait, no ataxia, negative Romberg, no nystagmus  Skin: Skin is warm.  Psychiatric: She has a normal mood and affect. Her behavior is normal.  Nursing note and vitals reviewed.   ED Course  Procedures (including critical care time) Labs Review Labs Reviewed  COMPREHENSIVE METABOLIC PANEL - Abnormal; Notable for the following:    CO2 20 (*)    Glucose, Bld 154 (*)  Total Protein 6.3 (*)    GFR calc non Af Amer 55 (*)    All other components within normal limits  URINE RAPID DRUG SCREEN, HOSP PERFORMED - Abnormal; Notable for the following:    Benzodiazepines POSITIVE (*)    All other components within normal limits  TSH - Abnormal; Notable for the following:    TSH 5.298 (*)    All other components within normal limits  ACETAMINOPHEN LEVEL - Abnormal; Notable for the following:    Acetaminophen (Tylenol), Serum <10 (*)    All other components within normal limits  URINALYSIS, ROUTINE W REFLEX MICROSCOPIC (NOT AT [facility]) - Abnormal; Notable for the following:    Leukocytes, UA TRACE (*)    All other components within normal limits  URINE MICROSCOPIC-ADD ON - Abnormal; Notable for the following:    Squamous Epithelial / LPF 6-30 (*)    Bacteria, UA FEW (*)    All other components within normal limits  ETHANOL  SALICYLATE LEVEL  ACETAMINOPHEN LEVEL  CBC  CK  T4, FREE    Imaging Review Dg Chest 2  View  10/17/2015  CLINICAL DATA:  ams today; dyspnea after exertion; hx HTN, non-smoker EXAM: CHEST  2 VIEW COMPARISON:  CT 01/25/2014 and previous FINDINGS: Progressive right infrahilar fullness.  Left lung clear. Heart size normal.  No pleural effusion or pneumothorax. Anterior vertebral endplate spurring at multiple levels in the mid and lower thoracic spine. Degenerative changes in bilateral shoulders. IMPRESSION: 1. Progressive right infrahilar fullness. Consider outpatient elective CT chest with contrast to assess for progression of the previously identified right infrahilar nodule. Electronically Signed   By: Corlis Leak  Hassell M.D.   On: 10/17/2015 12:07   Ct Head Wo Contrast  10/17/2015  CLINICAL DATA:  Altered mental status with several recent falls EXAM: CT HEAD WITHOUT CONTRAST TECHNIQUE: Contiguous axial images were obtained from the base of the skull through the vertex without intravenous contrast. COMPARISON:  February 20, 2014 FINDINGS: There is age related volume loss. There is no intracranial mass, hemorrhage, extra-axial fluid collection, or midline shift. There is an asymmetric perivascular space in the left temporal region, stable and benign. There is slight small vessel disease in the periventricular white matter. The gray-white compartments otherwise appear normal. No acute infarct is evident. Bony calvarium appears intact. Visualized mastoid air cells are clear. No intraorbital lesions are evident. IMPRESSION: Age related volume loss with mild periventricular small vessel disease. No intracranial mass, hemorrhage, or acute appearing infarct. No extra-axial fluid collections. Electronically Signed   By: Bretta BangWilliam  Woodruff III M.D.   On: 10/17/2015 13:34   I have personally reviewed and evaluated these images and lab results as part of my medical decision-making.   EKG Interpretation   Date/Time:  Tuesday October 17 2015 13:38:24 EDT Ventricular Rate:  76 PR Interval:  148 QRS Duration: 92 QT  Interval:  403 QTC Calculation: 453 R Axis:   2 Text Interpretation:  Sinus rhythm No significant change was found  Confirmed by Manus GunningANCOUR  MD, Jerrine Urschel [id number]) on 10/17/2015 1:42:10 PM      MDM   Final diagnoses:  Anxiety  Other specified hypothyroidism  Depression   Patient presenting with depression and anxiety and thoughts of suicide. Also has had frequent falls, diffuse weakness, feeling poorly since stopping her Synthroid 2 months ago. States she "cannot walk" but she is ambulatory in the room and has 5 out of 5 strength in her lower extremities. Intact distal pulses. No bowel or bladder  incontinence. No fever.  CT head stable. No back pain. Equal lower extremity strength and sensation. Patient is claustrophobic and declines MRI even with anxiolysis.  Labs show mild elevation of TSH at 5.3. Free T4 is normal. Repeat acetaminophen level negative. CT head is negative for acute pathology. Patient has no focal neurological deficits. She refuses MRI due to claustrophobia.  CXR results d/w patient and need for repeat chest CT to evaluate perihilar nodule as seen previously.  TTS consulted for depression and suicidal thoughts. Her symptoms may be caused from her hypothyroidism.noncompliance with her Synthroid.   TTS has seen patient and feels that she does not need inpatient treatment.  Will refer to outpatient therapy/psychiatry.  Patient states she feels safe at home but feels stressed because her bipolar son lives with her.  She denies any violence and states that she feels safe.  Denies SI or HI.  Outpatient resources given.  Follow up with PCP for chest CT.  Restart synthroid as hypothyroidism may be contributing to her symptoms. Return precautions discussed.  Glynn Octave, MD 10/17/15 1902

## 2015-10-17 NOTE — BH Assessment (Addendum)
Tele Assessment Note   Jody Porter is a 80 y.o. female who presents voluntarily to [facility] with complaints of crying spells and panic for the past 2 months. Incidentally, this onset of symptoms started around the same time she stopped taking her Synthroid. Pt was calm and pleasant during assessment. She indicated that she is prescribed Xanax by her primary care physician, but only takes it "when I feel I need it". Pt denies any psych hx, abuse hx, or drug/alcohol hx. Pt endorses passive SI w/ no plan or intent. Pt indicates that she feels safe and does not have any fears that she may harm herself. Upon inquiry of what pt would like to happen for her, pt replied, "I want to know what's wrong, why I feel so bad...my body feels strange". Pt denies HI and AVH and didn't appear to be responding to any internal stimuli.  Diagnosis: MDD, single episode, moderate; Unspecified anxiety d/o  Past Medical History:  Past Medical History  Diagnosis Date  . LBP (low back pain)   . Depression   . Hypertension   . GERD (gastroesophageal reflux disease)   . Hypothyroid   . Hyperparathyroidism   . Insomnia     w/ sleep apnea NPSG 07/13/2009 AHI 3.5, RDI 29.9/hr  . Allergic rhinitis   . Anxiety   . Celiac disease   . Memory difficulties 01/27/2014  . Obesity   . Degenerative arthritis   . Macular edema     OD  . Prediabetes   . Urinary incontinence     Past Surgical History  Procedure Laterality Date  . Lumbar disc surgery    . Parathyroidectomy    . Cholecystectomy    . Knee surgery Right     reports multiple surgery on knee  . Carpel tunnel Bilateral   . Cesarean section    . Cataracts      Family History:  Family History  Problem Relation Age of Onset  . Cancer Mother   . Seizures Mother     grand mal  . Cancer Father     prostate  . Cancer Paternal Grandmother   . Cancer Maternal Grandmother   . Arthritis/Rheumatoid Sister     Social History:  reports that she has never  smoked. She has never used smokeless tobacco. She reports that she does not drink alcohol or use illicit drugs.  Additional Social History:  Alcohol / Drug Use Pain Medications: see PTA meds Prescriptions: see PTA meds Over the Counter: see PTA meds History of alcohol / drug use?: No history of alcohol / drug abuse  CIWA: CIWA-Ar BP: 144/66 mmHg Pulse Rate: 73 COWS:    PATIENT STRENGTHS: (choose at least two) Ability for insight Average or above average intelligence Capable of independent living Licensed conveyancer Motivation for treatment/growth Supportive family/friends  Allergies:  Allergies  Allergen Reactions  . Aricept [Donepezil Hcl] Other (See Comments)    Nightmares  . Sugar-Protein-Starch Other (See Comments)    Loses voice, congestion  . Ciprofloxacin Other (See Comments)    Stomach pains  . Prednisone Other (See Comments)    Marked mentation/ mood change  . Sudafed [Pseudoephedrine Hcl] Other (See Comments)    Hyperactive and agitated, congestion   . Valium [Diazepam] Other (See Comments)    Altered mental status changes-significant problems  . Hydroxyzine Other (See Comments)    Hallucinations   . Acyclovir Rash  . Penicillins Itching and Rash    Has patient had a PCN  reaction causing immediate rash, facial/tongue/throat swelling, SOB or lightheadedness with hypotension: Yes Has patient had a PCN reaction causing severe rash involving mucus membranes or skin necrosis: No Has patient had a PCN reaction that required hospitalization No Has patient had a PCN reaction occurring within the last 10 years: No If all of the above answers are "NO", then may proceed with Cephalosporin use.     Home Medications:  (Not in a hospital admission)  OB/GYN Status:  No LMP recorded. Patient is postmenopausal.  General Assessment Data [facility] Assessment: In system Is this a Tele or Face-to-Face Assessment?: Tele  Assessment Is this an Initial Assessment or a Re-assessment for this encounter?: Initial Assessment Marital status: Widowed Is patient pregnant?: No Pregnancy Status: No Living Arrangements: Children (son) Can pt return to current living arrangement?: Yes Admission Status: Voluntary Is patient capable of signing voluntary admission?: Yes Referral Source: Self/Family/Friend Insurance type: Medicare  Medical Screening Exam [facility] Walk-in ONLY) Medical Exam completed: Yes  Crisis Care Plan Living Arrangements: Children (son) Name of Psychiatrist: none Name of Therapist: none  Education Status Is patient currently in school?: No  Risk to self with the past 6 months Suicidal Ideation: Yes-Currently Present Has patient been a risk to self within the past 6 months prior to admission? : No Suicidal Intent: No Has patient had any suicidal intent within the past 6 months prior to admission? : No Is patient at risk for suicide?: No Suicidal Plan?: No Has patient had any suicidal plan within the past 6 months prior to admission? : No Access to Means: No What has been your use of drugs/alcohol within the last 12 months?: pt denies Previous Attempts/Gestures: No How many times?: 0 Other Self Harm Risks: pt denies Triggers for Past Attempts: Other (Comment) (no past attempts) Intentional Self Injurious Behavior: None Family Suicide History: No Recent stressful life event(s): Other (Comment) (increasing anxiety and panic) Persecutory voices/beliefs?: No Depression: Yes Depression Symptoms: Fatigue, Isolating, Tearfulness Substance abuse history and/or treatment for substance abuse?: No Suicide prevention information given to non-admitted patients: Not applicable  Risk to Others within the past 6 months Homicidal Ideation: No Does patient have any lifetime risk of violence toward others beyond the six months prior to admission? : No Thoughts of Harm to Others: No Current Homicidal  Intent: No Current Homicidal Plan: No Access to Homicidal Means: No History of harm to others?: No Assessment of Violence: None Noted Violent Behavior Description: none noted Does patient have access to weapons?: No Criminal Charges Pending?: No Does patient have a court date: No Is patient on probation?: No  Psychosis Hallucinations: None noted Delusions: None noted  Mental Status Report Appearance/Hygiene: Unremarkable Eye Contact: Good Motor Activity: Unremarkable Speech: Logical/coherent Level of Consciousness: Alert Mood: Pleasant Affect: Appropriate to circumstance Anxiety Level: None Thought Processes: Coherent, Relevant Judgement: Unimpaired Orientation: Person, Place, Time, Situation Obsessive Compulsive Thoughts/Behaviors: None  Cognitive Functioning Concentration: Normal Memory: Recent Intact, Remote Intact IQ: Average Insight: Good Impulse Control: Good Appetite: Fair Sleep: No Change Total Hours of Sleep: 4 Vegetative Symptoms: None  ADLScreening [facility] Assessment Services) Patient's cognitive ability adequate to safely complete daily activities?: Yes Patient able to express need for assistance with ADLs?: Yes Independently performs ADLs?: Yes (appropriate for developmental age)  Prior Inpatient Therapy Prior Inpatient Therapy: No  Prior Outpatient Therapy Prior Outpatient Therapy: No Does patient have an ACCT team?: No Does patient have Intensive In-House Services?  : No Does patient have [facility] services? :  No Does patient have P4CC services?: No  ADL Screening (condition at time of admission) Patient's cognitive ability adequate to safely complete daily activities?: Yes Is the patient deaf or have difficulty hearing?: No Does the patient have difficulty seeing, even when wearing glasses/contacts?: No Does the patient have difficulty concentrating, remembering, or making decisions?: No Patient able to express need for assistance with ADLs?:  Yes Does the patient have difficulty dressing or bathing?: No Independently performs ADLs?: Yes (appropriate for developmental age) Does the patient have difficulty walking or climbing stairs?: Yes (damaged thigh) Weakness of Legs: Left Weakness of Arms/Hands: None  Home Assistive Devices/Equipment Home Assistive Devices/Equipment: None  Therapy Consults (therapy consults require a physician order) PT Evaluation Needed: No OT Evalulation Needed: No SLP Evaluation Needed: No Abuse/Neglect Assessment (Assessment to be complete while patient is alone) Physical Abuse: Denies Verbal Abuse: Denies Sexual Abuse: Denies Exploitation of patient/patient's resources: Denies Self-Neglect: Denies Values / Beliefs Cultural Requests During Hospitalization: None Spiritual Requests During Hospitalization: None Consults Spiritual Care Consult Needed: No Social Work Consult Needed: No Merchant navy officer (For Healthcare) Does patient have an advance directive?: No Would patient like information on creating an advanced directive?: No - patient declined information    Additional Information 1:1 In Past 12 Months?: No CIRT Risk: No Elopement Risk: No Does patient have medical clearance?: Yes     Disposition:  Disposition Initial Assessment Completed for this Encounter: Yes Disposition of Patient: Referred to (consulted with Fransisca Kaufmann, NP) Patient referred to: Other (Comment) (OP psychiatry/counseling)  Laddie Aquas 10/17/2015 3:45 PM

## 2015-10-17 NOTE — ED Notes (Signed)
Pt reports that she generally has felts bad x 2 months and has been experiencing uncontrollable crying and panic. Pt has been started on xanax with no relief. Pt alert x4. Pt reports she cannot deny SI ideations.

## 2015-10-17 NOTE — ED Notes (Signed)
Sitter at bedside, family at bedside

## 2015-10-17 NOTE — Discharge Instructions (Signed)
Generalized Anxiety Disorder Restart your synthroid. Follow up with Dr. Parke SimmersBland this week. Return to the ED if you develop new or worsening symptoms. Generalized anxiety disorder (GAD) is a mental disorder. It interferes with life functions, including relationships, work, and school. GAD is different from normal anxiety, which everyone experiences at some point in their lives in response to specific life events and activities. Normal anxiety actually helps us prepare for and get through these life events and activities. Normal anxiety goes away after the event or activity is over.  GAD causes anxiety that is not necessarily related to specific events or activities. It also causes excess anxiety in proportion to specific events or activities. The anxiety associated with GAD is also difficult to control. GAD can vary from mild to severe. People with severe GAD can have intense waves of anxiety with physical symptoms (panic attacks).  SYMPTOMS The anxiety and worry associated with GAD are difficult to control. This anxiety and worry are related to many life events and activities and also occur more days than not for 6 months or longer. People with GAD also have three or more of the following symptoms (one or more in children):  Restlessness.   Fatigue.  Difficulty concentrating.   Irritability.  Muscle tension.  Difficulty sleeping or unsatisfying sleep. DIAGNOSIS GAD is diagnosed through an assessment by your health care provider. Your health care provider will ask you questions aboutyour mood,physical symptoms, and events in your life. Your health care provider may ask you about your medical history and use of alcohol or drugs, including prescription medicines. Your health care provider may also do a physical exam and blood tests. Certain medical conditions and the use of certain substances can cause symptoms similar to those associated with GAD. Your health care provider may refer you to a  mental health specialist for further evaluation. TREATMENT The following therapies are usually used to treat GAD:   Medication. Antidepressant medication usually is prescribed for long-term daily control. Antianxiety medicines may be added in severe cases, especially when panic attacks occur.   Talk therapy (psychotherapy). Certain types of talk therapy can be helpful in treating GAD by providing support, education, and guidance. A form of talk therapy called cognitive behavioral therapy can teach you healthy ways to think about and react to daily life events and activities.  Stress managementtechniques. These include yoga, meditation, and exercise and can be very helpful when they are practiced regularly. A mental health specialist can help determine which treatment is best for you. Some people see improvement with one therapy. However, other people require a combination of therapies.   This information is not intended to replace advice given to you by your health care provider. Make sure you discuss any questions you have with your health care provider.   Document Released: 08/24/2012 Document Revised: 05/20/2014 Document Reviewed: 08/24/2012 Elsevier Interactive Patient Education 2016 [employer]  [employer] Resource Guide Outpatient Counseling/Substance Abuse Adult The United Ways 211 is a great source of information about community services available.  Access by dialing 2-1-1 from anywhere in [place], or by website -  PooledIncome.plwww.nc211.org.   Other Local Resources (Updated 05/2015)  Crisis Hotlines   Services     Area Served  [employer] [facility]  Crisis Hotline, available 24 hours a day, 7 days a week: 304-578-5861(650)785-3080 [facility] [place], [place]   [facility]  Crisis Hotline, available 24 hours a day, 7 days a week: (706)[phone]) [phone] [facility] [place] [place], [place]  [facility]  Suicide Prevention  Hotline, available 24 hours a day, 7 days a week: 442-223-1727  [facility] [place], [place]  [employer], available 24 hours a day, 7 days a week: [phone] [facility] [place], [place]   [employer] Access to [employer] Hotline, available 24 hours a day, 7 days a week: 910-045-7410 All   [facility]  Crisis Hotline, available 24 hours a day, 7 days a week: 901-121-7944 All   Other Local Resources (Updated 05/2015)  Outpatient Counseling/ Substance Abuse Programs  Services     Address and Phone Number  ADS ([facility])   Options include Individual counseling, group counseling, intensive outpatient program (several hours a day, several days a week)  Offers depression assessments  Provides methadone maintenance program [phone] [address] [place], [place] [address]   [facility]   Offers partial hospitalization/day treatment and DUI/DWI programs  [employer], private insurance [phone] [address] [place], [place] [address]  [facility]    Services include intensive outpatient program (several hours a day, several days a week), outpatient treatment, DUI/DWI services, family education  Also has some services specifically for [employer] transitional housing  [phone] [address] [place], [place] [address]     [facility]  [employer], private pay, and private insurance [phone] [address] [place], [place] [address]  [employer] [facility]  Services include individual counseling, substance abuse intensive outpatient program (several hours a day, several days a week), day treatment  Delene Loll, Medicaid, private insurance [phone] [address] [place], [place] [address]  [facility]   Offers substance abuse intensive outpatient program (several hours a day, several days a week), partial hospitalization program  [phone] [address] [place], [place] [address]  [phone] [address] [place], [place] [address]  [phone] [address] [place], [place] [address]  [phone] [place], [place] [address]  [facility]  Individual counseling only  Accepts private insurance only [phone] [address] [place], [place] [address]  [facility]  Methadone maintenance program [phone] [address] [place], [place] [address]  [facility]  Walk-In Clinic providing substance abuse and mental health counseling  Accepts Medicaid, Medicare, private insurance  Offers sliding scale for uninsured [phone] [address] [place], [place]   [facility] [place], [employer].  Offers individual counseling, and intensive in-home services [phone] [address] [place], [place] [address]  [facility] individual counseling, family counseling, group therapy, domestic violence counseling, consumer credit counseling  Accepts Medicare, Medicaid, private insurance  Offers sliding scale for uninsured [phone] [address] [place], [place] [address]  [phone] [facility], [address] [place], [place] [address]  [facility]  Offers individual, family and group counseling  3 locations - [place], [place], and [place]  [phone]  [address] [place], [place] [address]  [address] [place], [place] [address]  [address] [place], [place] [address]  [facility]    Offers psychiatric assessment, 8-week Intensive Outpatient Program (several hours a day, several times a week, daytime or evenings), early recovery group, family Program, medication management  Private pay or private insurance only [phone], or  [phone] [address] [place], [place] [address]  [employer] individual, couples and family  counseling  Accepts Medicaid, private insurance, and  sliding scale for uninsured [phone] [address] [place], [place] [address]  Len Blalock, MD  Individual counseling  Private insurance [phone] [address] [place], [place] [address]  [facility]   Offers assessment, substance abuse treatment, and behavioral health treatment [phone] [address] [place], [place] [address]  [facility]  Individual counseling  Accepts private insurance [phone] [address] [place], [place] [address]  [facility]  Individual counseling  Delene Loll, private insurance [phone] [address] [place], [place] [address]  [facility]    Offers intensive outpatient program (several hours a day, several times a week)  Private pay, private insurance [phone] [facility] [place], [place]  [facility]  Individual counseling  Medicare, private insurance [phone] [address] [place], [place] [address]  [facility]    Offers intensive outpatient program (several hours a day, several times a week) and partial hospitalization program [phone] [address] [place], [place] [address]  Emerson Monte, MD  Individual counseling [phone] [address] [place], [place] [address]  [facility]  Offers Christian counseling to individuals, couples, and families  Accepts Medicare and private insurance; offers sliding scale for uninsured [phone] [address] [place], [place] [address]  [facility]  [place] [facility] [phone] [address] [place], [place] [address]  [facility] crisis counseling, individual counseling, group therapy, in-home therapy, domestic violence services, day treatment, DWI services,  Administrator, arts (CST), Assertive Community Treatment Team (ACTT), substance abuse Intensive Outpatient Program (several hours a day, several times a week)  2 locations - [place] and [place] [phone] [place], [place] [address]  [phone] [address] [place], [place] [address]  [facility]     Individual counseling and group therapy  Accepts private insurance, [place], [place] [phone] [address] [place], [place]  [facility]  Offers individual and family counseling  Offers LGBTQ services  Accepts private insurance and private pay [phone] [address] [place], [place] [address]  [facility]    Offers individual counseling, group therapy, and outpatient detox  Accepts private insurance [phone] [address] [place], [place]  [facility]  Individual counseling  Accepts Medicare, private insurance [phone] [address] [place], [place] [address]  [employer]  Individual counseling  Accepts Medicare, private insurance [phone] [address] [place], [place] [address]  [facility]   Offers substance abuse Intensive Outpatient Program (several hours a day, several times a week) [phone], or [phone] [place], [place]

## 2015-10-17 NOTE — ED Notes (Signed)
Patient transported to MRI 

## 2015-10-17 NOTE — Progress Notes (Signed)
Sent for pt to have MRI, transport went to get him and pt refused exam, stated he was extremely claustrophobic.

## 2015-12-13 IMAGING — CT NM PET TUM IMG INITIAL (PI) SKULL BASE T - THIGH
1 of 8 series · 1 of 25 positions shown · non-contrast
Comparison: CT abdomen 12/15/2013

CLINICAL DATA: Initial treatment strategy for lung nodule.

EXAM:
NUCLEAR MEDICINE PET SKULL BASE TO THIGH
TECHNIQUE: 10.7 mCi F-18 FDG was injected intravenously. Full-ring PET imaging
was performed from the skull base to thigh after the radiotracer. CT
data was obtained and used for attenuation correction and anatomic
localization.
FASTING BLOOD GLUCOSE:  Value: 121 mg/dl

[Series 3: pet sk_thigh ac · axial · 5.0mm · 4.07mm/px · 1 of 204 slices shown]
[im 136/204]
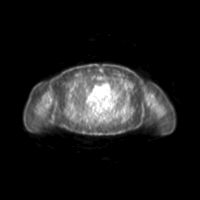

[1 of 25 positions shown; findings below may reference images not displayed]

FINDINGS: NECK

No hypermetabolic lymph nodes in the neck.

CHEST

Right infrahilar nodule measures 16 mm on image 4, series 2 with
mild metabolic activity (SUV max 3.5). This activity is not
significantly elevated above adjacent vasculature structures. No
additional pulmonary nodules are present. No hypermetabolic
mediastinal lymph nodes

ABDOMEN/PELVIS

No abnormal hypermetabolic activity within the liver, pancreas,
adrenal glands, or spleen. No hypermetabolic lymph nodes in the
abdomen or pelvis.

SKELETON

No focal hypermetabolic activity to suggest skeletal metastasis.
IMPRESSION: Mild metabolic activity of right infrahilar nodule is indeterminate
and biopsy would be difficult. Recommend follow-up CT of the thorax
with contrast in 3 months to evaluate for interval growth.

## 2016-02-27 ENCOUNTER — Encounter (HOSPITAL_COMMUNITY): Payer: Self-pay | Admitting: Family Medicine

## 2016-02-27 DIAGNOSIS — F410 Panic disorder [episodic paroxysmal anxiety] without agoraphobia: Secondary | ICD-10-CM | POA: Insufficient documentation

## 2016-02-27 DIAGNOSIS — E039 Hypothyroidism, unspecified: Secondary | ICD-10-CM | POA: Insufficient documentation

## 2016-02-27 DIAGNOSIS — Z7984 Long term (current) use of oral hypoglycemic drugs: Secondary | ICD-10-CM | POA: Diagnosis not present

## 2016-02-27 DIAGNOSIS — I10 Essential (primary) hypertension: Secondary | ICD-10-CM | POA: Diagnosis not present

## 2016-02-27 DIAGNOSIS — Z79899 Other long term (current) drug therapy: Secondary | ICD-10-CM | POA: Insufficient documentation

## 2016-02-27 DIAGNOSIS — F418 Other specified anxiety disorders: Secondary | ICD-10-CM | POA: Diagnosis not present

## 2016-02-27 DIAGNOSIS — F419 Anxiety disorder, unspecified: Secondary | ICD-10-CM | POA: Diagnosis present

## 2016-02-27 NOTE — ED Triage Notes (Signed)
Patient is experiencing being anxious, crying a lot, (in triage). Pt states she does not know what is wrong, she would deal with it, if she knew what was going on. Also, reports experiencing nausea every morning with intermittent vomiting. Socially, she lives with her son who is bi-polar. Stress has increased since he moved in about 1-2 years ago after being released by prison. Also, complains of chronic right hip pain.

## 2016-02-28 ENCOUNTER — Emergency Department (HOSPITAL_COMMUNITY)
Admission: EM | Admit: 2016-02-28 | Discharge: 2016-02-28 | Disposition: A | Discharge status: Home / Self Care | Payer: Medicare (Managed Care) | Attending: Emergency Medicine | Admitting: Emergency Medicine

## 2016-02-28 ENCOUNTER — Emergency Department (HOSPITAL_COMMUNITY): Payer: Medicare (Managed Care)

## 2016-02-28 DIAGNOSIS — F419 Anxiety disorder, unspecified: Secondary | ICD-10-CM

## 2016-02-28 DIAGNOSIS — F329 Major depressive disorder, single episode, unspecified: Secondary | ICD-10-CM

## 2016-02-28 DIAGNOSIS — F32A Depression, unspecified: Secondary | ICD-10-CM

## 2016-02-28 DIAGNOSIS — F410 Panic disorder [episodic paroxysmal anxiety] without agoraphobia: Secondary | ICD-10-CM

## 2016-02-28 LAB — CBC WITH DIFFERENTIAL/PLATELET
BASOS PCT: 0 %
Basophils Absolute: 0 10*3/uL (ref 0.0–0.1)
EOS ABS: 0.1 10*3/uL (ref 0.0–0.7)
Eosinophils Relative: 1 %
HEMATOCRIT: 44 % (ref 36.0–46.0)
HEMOGLOBIN: 14.6 g/dL (ref 12.0–15.0)
LYMPHS ABS: 2.6 10*3/uL (ref 0.7–4.0)
Lymphocytes Relative: 22 %
MCH: 28.6 pg (ref 26.0–34.0)
MCHC: 33.2 g/dL (ref 30.0–36.0)
MCV: 86.1 fL (ref 78.0–100.0)
MONO ABS: 1.1 10*3/uL — AB (ref 0.1–1.0)
MONOS PCT: 9 %
NEUTROS PCT: 68 %
Neutro Abs: 7.8 10*3/uL — ABNORMAL HIGH (ref 1.7–7.7)
Platelets: 262 10*3/uL (ref 150–400)
RBC: 5.11 MIL/uL (ref 3.87–5.11)
RDW: 15.2 % (ref 11.5–15.5)
WBC: 11.6 10*3/uL — ABNORMAL HIGH (ref 4.0–10.5)

## 2016-02-28 LAB — LIPASE, BLOOD: LIPASE: 23 U/L (ref 11–51)

## 2016-02-28 LAB — COMPREHENSIVE METABOLIC PANEL
ALT: 20 U/L (ref 14–54)
AST: 31 U/L (ref 15–41)
Albumin: 4.5 g/dL (ref 3.5–5.0)
Alkaline Phosphatase: 100 U/L (ref 38–126)
Anion gap: 11 (ref 5–15)
BUN: 18 mg/dL (ref 6–20)
CHLORIDE: 103 mmol/L (ref 101–111)
CO2: 24 mmol/L (ref 22–32)
Calcium: 10.1 mg/dL (ref 8.9–10.3)
Creatinine, Ser: 0.99 mg/dL (ref 0.44–1.00)
GFR calc Af Amer: >60 mL/min (ref >60)
GFR, EST NON AFRICAN AMERICAN: 53 mL/min — AB (ref >60)
Glucose, Bld: 137 mg/dL — ABNORMAL HIGH (ref 65–99)
POTASSIUM: 4.4 mmol/L (ref 3.5–5.1)
SODIUM: 138 mmol/L (ref 135–145)
Total Bilirubin: 1.3 mg/dL — ABNORMAL HIGH (ref 0.3–1.2)
Total Protein: 7.9 g/dL (ref 6.5–8.1)

## 2016-02-28 LAB — ETHANOL

## 2016-02-28 MED ORDER — METFORMIN HCL 500 MG PO TABS: 500.0000 mg | ORAL_TABLET | Freq: Every day | ORAL | Status: DC

## 2016-02-28 MED ORDER — MEMANTINE HCL ER 28 MG PO CP24
28.0000 mg | ORAL_CAPSULE | Freq: Every day | ORAL | Status: DC
  Filled 2016-02-28: qty 1

## 2016-02-28 MED ORDER — ALUM & MAG HYDROXIDE-SIMETH 200-200-20 MG/5ML PO SUSP: 30.0000 mL | ORAL | Status: DC | PRN

## 2016-02-28 MED ORDER — SIMVASTATIN 20 MG PO TABS: 20.0000 mg | ORAL_TABLET | Freq: Every day | ORAL | Status: DC

## 2016-02-28 MED ORDER — IBUPROFEN 800 MG PO TABS
800.0000 mg | ORAL_TABLET | Freq: Two times a day (BID) | ORAL | Status: DC | PRN
  Administered 2016-02-28: 800 mg via ORAL
  Filled 2016-02-28: qty 1

## 2016-02-28 MED ORDER — INSULIN ASPART 100 UNIT/ML ~~LOC~~ SOLN: 0.0000 [IU] | Freq: Three times a day (TID) | SUBCUTANEOUS | Status: DC

## 2016-02-28 MED ORDER — ALPRAZOLAM 0.5 MG PO TABS: 0.5000 mg | ORAL_TABLET | Freq: Two times a day (BID) | ORAL | Status: DC | PRN

## 2016-02-28 MED ORDER — ACETAMINOPHEN 325 MG PO TABS: 650.0000 mg | ORAL_TABLET | ORAL | Status: DC | PRN

## 2016-02-28 MED ORDER — LOSARTAN POTASSIUM 50 MG PO TABS
50.0000 mg | ORAL_TABLET | Freq: Every day | ORAL | Status: DC
  Filled 2016-02-28: qty 1

## 2016-02-28 MED ORDER — ONDANSETRON 8 MG PO TBDP: 8.0000 mg | ORAL_TABLET | Freq: Three times a day (TID) | ORAL | Status: DC | PRN

## 2016-02-28 MED ORDER — HYDROCODONE-ACETAMINOPHEN 5-325 MG PO TABS
1.0000 | ORAL_TABLET | Freq: Three times a day (TID) | ORAL | Status: DC | PRN
  Administered 2016-02-28: 1 via ORAL
  Filled 2016-02-28: qty 1

## 2016-02-28 MED ORDER — GI COCKTAIL ~~LOC~~
30.0000 mL | Freq: Once | ORAL | Status: AC
  Administered 2016-02-28: 30 mL via ORAL
  Filled 2016-02-28: qty 30

## 2016-02-28 MED ORDER — LEVOTHYROXINE SODIUM 112 MCG PO TABS
112.0000 ug | ORAL_TABLET | Freq: Every day | ORAL | Status: DC
  Filled 2016-02-28: qty 1

## 2016-02-28 NOTE — BH Assessment (Addendum)
Tele Assessment Note   Jody Porter is an 80 y.o. female, who presents voluntarily and accompanied by her daughter, son and granddaughter to [facility]. Pt reported for the past four months she has been waking up feeling nauseous. Pt reports at times she would throw up. Pt reported: "my body feels strange." Pt's daughter reported her mother said: "I cant take this no more, this is just so awful, I'm scared." Pt reported: "I don't have the ability to control the way I feel." Pt reported, "I don't know what's bothering me." Pt reported, this morning she had a breakdown (panic attack), that is why she came to the ED. Pt's granddaughter reported this past week, the pt has been crying daily however, she has been crying a few days out of the week over the past few months. Pt reported having back, hip and leg problems. Pt's son reported the pt has had five disks in her back removed. Pt reported her health has nothing to do with how she feels. When pt's son left the room it was noted that he is one of her stressors. Per pt's daughter, her brother is diagnosed with Bipolar, and her mother is $10,000 in debt because of her brother. Pt's daughter reported her brother will wear her mother down at about things. Pt reported her son treats her good, but he is a stressor. Pt reported, her  finances is a stressor.  Pt denied SI, HI, AVH, and self-injurious behaviors.   Pt denied previous inpatient admissions. Pt denies substance usage. Pt reported she is not linked to a psychiatrist or counseling. Pt reported she fired a doctor because they did not prescribe her medication for her anxiety. Pt reported the only doctor she sees is her Orthopaedic doctor.   Pt presented alert, disheveled with logical/coherent speech. Clinician had to repeat her questions because pt reported she did not hear me.  Pt's concentration was fair. Pt's insight and impulse control was fair. Pt's thought process was coherent/relevent. Pt reported she  could contract for safety if discharged. Pt's son and daughter reported their mother would be safe if discharged from the hospital. Pt's son reported his mother would have a hard time handling herself. Pt reported if inpatient was recommended she would have to think about it.   Diagnosis: Unspecified Anxiety Disorder  Past Medical History:  Past Medical History:  Diagnosis Date  . Allergic rhinitis   . Anxiety   . Celiac disease    Patient denies  . Degenerative arthritis   . Depression   . GERD (gastroesophageal reflux disease)   . Hyperparathyroidism   . Hypertension   . Hypothyroid   . Insomnia    w/ sleep apnea NPSG 07/13/2009 AHI 3.5, RDI 29.9/hr  . LBP (low back pain)   . Macular edema    OD  . Memory difficulties 01/27/2014  . Obesity   . Prediabetes   . Urinary incontinence     Past Surgical History:  Procedure Laterality Date  . carpel tunnel Bilateral   . cataracts    . CESAREAN SECTION    . CHOLECYSTECTOMY    . KNEE SURGERY Right    reports multiple surgery on knee  . LUMBAR DISC SURGERY    . PARATHYROIDECTOMY      Family History:  Family History  Problem Relation Age of Onset  . Cancer Mother   . Seizures Mother     grand mal  . Cancer Father     prostate  . Arthritis/Rheumatoid  Sister   . Cancer Paternal Grandmother   . Cancer Maternal Grandmother     Social History:  reports that she has never smoked. She has never used smokeless tobacco. She reports that she does not drink alcohol or use drugs.  Additional Social History:  Alcohol / Drug Use Pain Medications: Pt denies.  Prescriptions: Pt denies.  Over the Counter: Pt denies.  History of alcohol / drug use?: No history of alcohol / drug abuse  CIWA: CIWA-Ar BP: (!) 134/114 Pulse Rate: 78 COWS:    PATIENT STRENGTHS: (choose at least two) Average or above average intelligence Communication skills Supportive family/friends  Allergies:  Allergies  Allergen Reactions  . Aricept  [Donepezil Hcl] Other (See Comments)    Nightmares  . Sugar-Protein-Starch Other (See Comments)    Loses voice, congestion  . Ciprofloxacin Other (See Comments)    Stomach pains  . Prednisone Other (See Comments)    Marked mentation/ mood change  . Sudafed [Pseudoephedrine Hcl] Other (See Comments)    Hyperactive and agitated, congestion   . Valium [Diazepam] Other (See Comments)    Altered mental status changes-significant problems  . Hydroxyzine Other (See Comments)    Hallucinations   . Acyclovir Rash  . Penicillins Itching and Rash    Has patient had a PCN reaction causing immediate rash, facial/tongue/throat swelling, SOB or lightheadedness with hypotension: Yes Has patient had a PCN reaction causing severe rash involving mucus membranes or skin necrosis: No Has patient had a PCN reaction that required hospitalization No Has patient had a PCN reaction occurring within the last 10 years: No If all of the above answers are "NO", then may proceed with Cephalosporin use.     Home Medications:  (Not in a hospital admission)  OB/GYN Status:  No LMP recorded. Patient is postmenopausal.  General Assessment Data Location of Assessment: [facility] TTS Assessment: In system Is this a Tele or Face-to-Face Assessment?: Face-to-Face Is this an Initial Assessment or a Re-assessment for this encounter?: Initial Assessment Marital status: Widowed [place] name: NA Is patient pregnant?: No Pregnancy Status: No Living Arrangements: Children Can pt return to current living arrangement?: Yes Admission Status: Voluntary Is patient capable of signing voluntary admission?: Yes Referral Source: Self/Family/Friend Insurance type: Generic Medicaid     Crisis Care Plan Living Arrangements: Children Legal Guardian: Other: (Self) Name of Psychiatrist: NA Name of Therapist: NA  Education Status Is patient currently in school?: No Current Grade: NA Highest grade of school patient has  completed: [employer] Name of school: NA Contact person: NA  Risk to self with the past 6 months Suicidal Ideation: No Has patient been a risk to self within the past 6 months prior to admission? : No Suicidal Intent: No Has patient had any suicidal intent within the past 6 months prior to admission? : No Is patient at risk for suicide?: No Suicidal Plan?: No Has patient had any suicidal plan within the past 6 months prior to admission? : No Access to Means: No What has been your use of drugs/alcohol within the last 12 months?: Pt denies.  Previous Attempts/Gestures: No How many times?: 0 Other Self Harm Risks: NA Triggers for Past Attempts: None known Intentional Self Injurious Behavior: None Family Suicide History: Unknown Recent stressful life event(s): Other (Comment) (Pt reported he son living with her is stressful. ) Persecutory voices/beliefs?: No Depression: Yes Depression Symptoms:  (crying episodes, difficutly concentrating. ) Substance abuse history and/or treatment for substance abuse?: No Suicide prevention information given to  non-admitted patients: Not applicable  Risk to Others within the past 6 months Homicidal Ideation: No Does patient have any lifetime risk of violence toward others beyond the six months prior to admission? : No Thoughts of Harm to Others: No Current Homicidal Intent: No Current Homicidal Plan: No Access to Homicidal Means: No Identified Victim:  (NA) History of harm to others?: No Assessment of Violence: None Noted Violent Behavior Description: NA Does patient have access to weapons?: No Criminal Charges Pending?: No Does patient have a court date: No Is patient on probation?: No  Psychosis Hallucinations: None noted Delusions: None noted  Mental Status Report Appearance/Hygiene: In scrubs, Disheveled Eye Contact: Fair Motor Activity: Unremarkable Speech: Logical/coherent Level of Consciousness: Alert Mood: Depressed, Anxious Affect:  Depressed, Anxious Anxiety Level: Panic Attacks Panic attack frequency: Pt has a panic this morning.  Most recent panic attack: Pt had panic attack this morning.  Thought Processes: Coherent, Relevant Judgement: Partial Orientation: Time, Person, Situation, Place Obsessive Compulsive Thoughts/Behaviors: Unable to Assess  Cognitive Functioning Concentration: Fair Memory: Recent Intact IQ: Average Insight: Fair Impulse Control: Fair Appetite: Poor Weight Loss:  (UTA) Weight Gain:  (UTA) Sleep: Decreased Total Hours of Sleep:  (2-4) Vegetative Symptoms: None  ADLScreening [facility] Assessment Services) Patient's cognitive ability adequate to safely complete daily activities?: Yes Patient able to express need for assistance with ADLs?: Yes Independently performs ADLs?: Yes (appropriate for developmental age)  Prior Inpatient Therapy Prior Inpatient Therapy: No Prior Therapy Dates: NA Prior Therapy Facilty/Provider(s): NA Reason for Treatment: NA  Prior Outpatient Therapy Prior Outpatient Therapy: No Prior Therapy Dates: NA Prior Therapy Facilty/Provider(s): NA Reason for Treatment: NA Does patient have an ACCT team?: No Does patient have Intensive In-House Services?  : No Does patient have [facility] services? : No Does patient have P4CC services?: No  ADL Screening (condition at time of admission) Patient's cognitive ability adequate to safely complete daily activities?: Yes Is the patient deaf or have difficulty hearing?:  (Pt asked clinican to repeat herself during the assessment. ) Does the patient have difficulty seeing, even when wearing glasses/contacts?: Yes (Pt wears glasses. ) Does the patient have difficulty concentrating, remembering, or making decisions?: Yes (Pt reported difficutly concentrating. ) Patient able to express need for assistance with ADLs?: Yes Does the patient have difficulty dressing or bathing?: No Independently performs ADLs?: Yes (appropriate for  developmental age) Does the patient have difficulty walking or climbing stairs?: No Weakness of Legs: None Weakness of Arms/Hands: None       Abuse/Neglect Assessment (Assessment to be complete while patient is alone) Physical Abuse:  (UTA) Verbal Abuse:  (UTA) Sexual Abuse:  (UTA) Exploitation of patient/patient's resources:  (UTA) Self-Neglect:  (UTA)     Advance Directives (For Healthcare) Does patient have an advance directive?: No Would patient like information on creating an advanced directive?: No - patient declined information Type of Advance Directive: Living will, Healthcare Power of Attorney Does patient want to make changes to advanced directive?: No - Patient declined Copy of advanced directive(s) in chart?: Yes    Additional Information 1:1 In Past 12 Months?: No CIRT Risk: No Elopement Risk: No Does patient have medical clearance?: Yes     Disposition: Donell Sievert, PA recommends OPT resources. Disposition was discussed with Victorino Dike, RN. Clinician faxed OPT resources.  Disposition Initial Assessment Completed for this Encounter: Yes Disposition of Patient: Other dispositions Other disposition(s): Other (Comment) (OPT resources to be given to pt. )  Gwinda Passe 02/28/2016 6:28 AM  Gwinda Passereylese D Bennett, MS, [facility], [facility] Triage Specialist [phone]

## 2016-02-28 NOTE — ED Notes (Signed)
Lew DawesRitter stuck pt twice was unsuccessful Ardine Engn Jennifer made aware

## 2016-02-28 NOTE — ED Provider Notes (Signed)
[facility]-EMERGENCY DEPT Provider Note   CSN: [id number] Arrival date & time: 02/27/16  2210  By signing my name below, I, Majel HomerPeyton Lee, attest that this documentation has been prepared under the direction and in the presence of Gilda Creasehristopher J Pollina, MD . Electronically Signed: Majel HomerPeyton Lee, Scribe. 02/28/2016. 1:12 AM.  History   Chief Complaint Chief Complaint  Patient presents with  . Anxiety   The history is provided by the patient. No language interpreter was used.   HPI Comments: Jody Porter is a 80 y.o. female with PMHx of depression and GERD, who presents to the Emergency Department for an evaluation of gradually worsening, anxiety and generalized pain that began a few months ago. Pt reports intermittent, diffuse pain centralized to her right hip and back that she believes is due to arthritis; however, her daughter believes her pain is due to "something mental and emotional." Pt's daughter notes intermittent episodes of "crying hysterically" that lasts for several days. Pt reports she is known as the "happiest person in the world" so she does not know why she is feeling this way. She states she would "deal with it if she knew what was wrong but she doesn't know waht is going on." Pt states she was "fighting the urge to cry" at a meeting this evening and had a "meltdown" once she realized she would be at home alone once her daughter dropped her off. She states she finally told her daughter that she wanted to talk to someone about her anxiety so she came to the ED to seek help. Pt also reports she wakes up with nausea every day; she notes this began several months ago and could be due to her GERD. She states she takes Protonix every day with relief. Pt also notes her son has bipolar disorder and moved in with her a few years ago; she states she has experienced increased stress since then due to this. Pt notes [facility] to receive steroid injections in  her right hip on 10/26.   Past Medical History:  Diagnosis Date  . Allergic rhinitis   . Anxiety   . Celiac disease    Patient denies  . Degenerative arthritis   . Depression   . GERD (gastroesophageal reflux disease)   . Hyperparathyroidism   . Hypertension   . Hypothyroid   . Insomnia    w/ sleep apnea NPSG 07/13/2009 AHI 3.5, RDI 29.9/hr  . LBP (low back pain)   . Macular edema    OD  . Memory difficulties 01/27/2014  . Obesity   . Prediabetes   . Urinary incontinence     Patient Active Problem List   Diagnosis Date Noted  . Memory difficulties 01/27/2014  . Solitary pulmonary nodule 01/19/2014  . Other specified hypothyroidism 11/23/2013  . Depression 11/23/2013  . Essential hypertension, benign 11/23/2013  . Low urine output 11/23/2013  . Other malaise and fatigue 11/23/2013  . Cough 05/12/2013  . Severe obesity (BMI >= 40) (HCC) 01/08/2013  . ALLERGIC RHINITIS 08/21/2009  . INSOMNIA WITH SLEEP APNEA UNSPECIFIED 08/18/2009  . Hypothyroidism 02/24/2007  . DEPRESSION 02/24/2007  . Essential hypertension 02/24/2007  . GERD 02/24/2007  . LOW BACK PAIN 02/24/2007  . HYPERPARATHYROIDISM, HX OF 02/24/2007    Past Surgical History:  Procedure Laterality Date  . carpel tunnel Bilateral   . cataracts    . CESAREAN SECTION    . CHOLECYSTECTOMY    . KNEE SURGERY Right  reports multiple surgery on knee  . LUMBAR DISC SURGERY    . PARATHYROIDECTOMY      OB History    No data available     Home Medications    Prior to Admission medications   Medication Sig Start Date End Date Taking? Authorizing Provider  acetaminophen (TYLENOL) 500 MG tablet Take 1,000 mg by mouth 2 (two) times daily as needed for mild pain.   Yes Historical Provider, MD  acetaminophen-codeine (TYLENOL #3) 300-30 MG tablet Take 1 tablet by mouth every 8 (eight) hours as needed for moderate pain.  02/09/16  Yes Historical Provider, MD  ALPRAZolam Prudy Feeler) 0.5 MG tablet Take 0.5 mg by mouth 2  (two) times daily as needed for anxiety.  08/30/15  Yes Historical Provider, MD  Biotin 1000 MCG tablet Take 1,000 mcg by mouth daily.     Yes Historical Provider, MD  diphenhydrAMINE (BENADRYL) 25 mg capsule Take 50-100 mg by mouth at bedtime as needed for allergies or sleep.    Yes Historical Provider, MD  DUREZOL 0.05 % EMUL Place 1 drop into the left eye 2 (two) times daily. 10/02/15  Yes Historical Provider, MD  HYDROcodone-acetaminophen (NORCO/VICODIN) 5-325 MG tablet Take 1-2 tablets by mouth every 8 (eight) hours as needed for severe pain.  02/16/16  Yes Historical Provider, MD  ibuprofen (ADVIL,MOTRIN) 200 MG tablet Take 800 mg by mouth 2 (two) times daily as needed for moderate pain.    Yes Historical Provider, MD  levothyroxine (SYNTHROID, LEVOTHROID) 112 MCG tablet TAKE 1 TABLET (112 MCG TOTAL) BY MOUTH DAILY. 09/19/14  Yes Sharon Seller, NP  losartan (COZAAR) 25 MG tablet Take 2 tablets (50 mg total) by mouth daily. 04/14/14  Yes Sharon Seller, NP  Memantine HCl ER 28 MG CP24 Take 28 mg by mouth daily. 04/14/14  Yes Sharon Seller, NP  metFORMIN (GLUCOPHAGE) 500 MG tablet Take 500 mg by mouth daily. 12/30/14  Yes Historical Provider, MD  simvastatin (ZOCOR) 20 MG tablet Take 20 mg by mouth at bedtime. 01/21/15  Yes Historical Provider, MD  tobramycin (TOBREX) 0.3 % ophthalmic solution Place 1 drop into both eyes 4 (four) times daily. 10/02/15  Yes Historical Provider, MD  VESICARE 5 MG tablet Take 5 mg by mouth daily. 01/15/15  Yes Historical Provider, MD  Vitamin D, Ergocalciferol, (DRISDOL) 50000 UNITS CAPS capsule Take 50,000 Units by mouth every Friday. 01/21/15  Yes Historical Provider, MD    Family History Family History  Problem Relation Age of Onset  . Cancer Mother   . Seizures Mother     grand mal  . Cancer Father     prostate  . Arthritis/Rheumatoid Sister   . Cancer Paternal Grandmother   . Cancer Maternal Grandmother     Social History Social History  Substance  Use Topics  . Smoking status: Never Smoker  . Smokeless tobacco: Never Used  . Alcohol use No   Allergies   Aricept [donepezil hcl]; Sugar-protein-starch; Ciprofloxacin; Prednisone; Sudafed [pseudoephedrine hcl]; Valium [diazepam]; Hydroxyzine; Acyclovir; and Penicillins   Review of Systems Review of Systems  Musculoskeletal: Positive for arthralgias.  Psychiatric/Behavioral:       Anxious  All other systems reviewed and are negative.  Physical Exam Updated Vital Signs BP 177/77 (BP Location: Right Arm) Comment (BP Location): Lower  Pulse 94   Temp 98.3 F (36.8 C) (Oral)   Resp 20   Ht 4\' 9"  (1.448 m)   Wt 202 lb (91.6 kg)   SpO2 100%  BMI 43.71 kg/m   Physical Exam  Constitutional: She is oriented to person, place, and time. She appears well-developed and well-nourished. No distress.  HENT:  Head: Normocephalic and atraumatic.  Right Ear: Hearing normal.  Left Ear: Hearing normal.  Nose: Nose normal.  Mouth/Throat: Oropharynx is clear and moist and mucous membranes are normal.  Eyes: Conjunctivae and EOM are normal. Pupils are equal, round, and reactive to light.  Neck: Normal range of motion. Neck supple.  Cardiovascular: Regular rhythm, S1 normal and S2 normal.  Exam reveals no gallop and no friction rub.   No murmur heard. Pulmonary/Chest: Effort normal and breath sounds normal. No respiratory distress. She exhibits no tenderness.  Abdominal: Soft. Normal appearance and bowel sounds are normal. There is no hepatosplenomegaly. There is no tenderness. There is no rebound, no guarding, no tenderness at McBurney's point and negative Murphy's sign. No hernia.  Musculoskeletal: Normal range of motion.  Neurological: She is alert and oriented to person, place, and time. She has normal strength. No cranial nerve deficit or sensory deficit. Coordination normal. GCS eye subscore is 4. GCS verbal subscore is 5. GCS motor subscore is 6.  Skin: Skin is warm, dry and intact. No  rash noted. No cyanosis.  Psychiatric: She has a normal mood and affect. Her speech is normal and behavior is normal. Thought content normal.  Tearful, anxious, depressed   Nursing note and vitals reviewed.  ED Treatments / Results  Labs (all labs ordered are listed, but only abnormal results are displayed) Labs Reviewed  CBC WITH DIFFERENTIAL/PLATELET - Abnormal; Notable for the following:       Result Value   WBC 11.6 (*)    Neutro Abs 7.8 (*)    Monocytes Absolute 1.1 (*)    All other components within normal limits  COMPREHENSIVE METABOLIC PANEL  URINALYSIS, ROUTINE W REFLEX MICROSCOPIC (NOT AT [facility])  LIPASE, BLOOD  RAPID URINE DRUG SCREEN, HOSP PERFORMED  ETHANOL    EKG  EKG Interpretation None      Radiology Dg Abd Acute W/chest  Result Date: 02/28/2016 CLINICAL DATA:  80 year old female with nausea vomiting EXAM: DG ABDOMEN ACUTE W/ 1V CHEST COMPARISON:  Chest radiograph dated 10/17/2015 and CT of the abdomen pelvis dated 12/15/2013 FINDINGS: The lungs are clear. There is no pleural effusion or pneumothorax. The cardiac silhouette is within normal limits. Right infrahilar fullness as seen on the prior CT and radiograph similar or slightly progressed. Follow-up with CT, if not performed since the prior radiograph of 10/17/2015, recommended. There is no bowel dilatation or evidence of obstruction. No free air or radiopaque calculi. There is multilevel degenerative changes of the spine. No acute fracture. IMPRESSION: No acute cardiopulmonary process. Right infrahilar fullness corresponding to the nodule seen on the prior CT and radiographs. Follow-up with nonemergent CT recommended. No bowel obstruction Electronically Signed   By: Elgie Collard M.D.   On: 02/28/2016 02:15   Procedures Procedures (including critical care time)  Medications Ordered in ED Medications  alum & mag hydroxide-simeth (MAALOX/MYLANTA) 200-200-20 MG/5ML suspension 30 mL (not administered)    acetaminophen (TYLENOL) tablet 650 mg (not administered)  ondansetron (ZOFRAN-ODT) disintegrating tablet 8 mg (not administered)  ALPRAZolam (XANAX) tablet 0.5 mg (not administered)  HYDROcodone-acetaminophen (NORCO/VICODIN) 5-325 MG per tablet 1-2 tablet (not administered)  ibuprofen (ADVIL,MOTRIN) tablet 800 mg (not administered)  levothyroxine (SYNTHROID, LEVOTHROID) tablet 112 mcg (not administered)  losartan (COZAAR) tablet 50 mg (not administered)  memantine (NAMENDA XR) 24 hr capsule 28 mg (not administered)  metFORMIN (GLUCOPHAGE) tablet 500 mg (not administered)  simvastatin (ZOCOR) tablet 20 mg (not administered)  insulin aspart (novoLOG) injection 0-15 Units (not administered)    DIAGNOSTIC STUDIES:  Oxygen Saturation is 100% on RA, normal by my interpretation.    COORDINATION OF CARE:  1:09 AM Discussed treatment plan with pt at bedside and pt agreed to plan.  Initial Impression / Assessment and Plan / ED Course  I have reviewed the triage vital signs and the nursing notes.  Pertinent labs & imaging results that were available during my care of the patient were reviewed by me and considered in my medical decision making (see chart for details).  Clinical Course  Patient presented with multiple ongoing issues. Patient has been experiencing increased anxiety and stress for many months. Family reports that she becomes tearful throughout portions of the day and this has become debilitating. Patient admits to significant increased stress, anxiety and depression. She is not homicidal or suicidal. She will require psychiatric evaluation for possible placement to [facility].  Patient complaining of nausea, present each morning that has been ongoing for 4 months. She has been worked up by her primary care doctor for this with no explanation. She does have a history of GERD. She has recently stopped taking proton's because it causes "mental problems". Symptoms likely secondary  to GERD and her anxiety. She is status post cholecystectomy and appendectomy. Abdominal exam is benign, no acute surgical process suspected. X-ray unremarkable.  Patient complains of right hip pain. This is been ongoing for one month. She is under the care of orthopedics for this, scheduled for an injection of steroids next week. She is actually not experiencing any pain currently. No further workup necessary for this.  Patient had medical screening exam was performed, no acute findings. Patient medically clear for psychiatric evaluation.  I personally performed the services described in this documentation, which was scribed in my presence. The recorded information has been reviewed and is accurate.   Final Clinical Impressions(s) / ED Diagnoses   Final diagnoses:  Anxiety  Panic attack  Depression, unspecified depression type    New Prescriptions New Prescriptions   No medications on file     Gilda Crease, MD 02/28/16 [phone]

## 2016-03-31 ENCOUNTER — Emergency Department (HOSPITAL_COMMUNITY)
Admission: EM | Admit: 2016-03-31 | Discharge: 2016-04-02 | Disposition: A | Discharge status: Home / Self Care | Payer: Medicare (Managed Care) | Attending: Emergency Medicine | Admitting: Emergency Medicine

## 2016-03-31 ENCOUNTER — Encounter (HOSPITAL_COMMUNITY): Payer: Self-pay | Admitting: Emergency Medicine

## 2016-03-31 DIAGNOSIS — Z79899 Other long term (current) drug therapy: Secondary | ICD-10-CM | POA: Insufficient documentation

## 2016-03-31 DIAGNOSIS — F39 Unspecified mood [affective] disorder: Secondary | ICD-10-CM | POA: Insufficient documentation

## 2016-03-31 DIAGNOSIS — F419 Anxiety disorder, unspecified: Secondary | ICD-10-CM | POA: Diagnosis present

## 2016-03-31 DIAGNOSIS — F332 Major depressive disorder, recurrent severe without psychotic features: Secondary | ICD-10-CM | POA: Diagnosis not present

## 2016-03-31 DIAGNOSIS — Z8042 Family history of malignant neoplasm of prostate: Secondary | ICD-10-CM | POA: Diagnosis not present

## 2016-03-31 DIAGNOSIS — I10 Essential (primary) hypertension: Secondary | ICD-10-CM | POA: Diagnosis not present

## 2016-03-31 DIAGNOSIS — Z008 Encounter for other general examination: Secondary | ICD-10-CM

## 2016-03-31 DIAGNOSIS — E039 Hypothyroidism, unspecified: Secondary | ICD-10-CM | POA: Diagnosis not present

## 2016-03-31 DIAGNOSIS — Z808 Family history of malignant neoplasm of other organs or systems: Secondary | ICD-10-CM | POA: Diagnosis not present

## 2016-03-31 LAB — CBC
HEMATOCRIT: 41.2 % (ref 36.0–46.0)
Hemoglobin: 13.6 g/dL (ref 12.0–15.0)
MCH: 29.1 pg (ref 26.0–34.0)
MCHC: 33 g/dL (ref 30.0–36.0)
MCV: 88 fL (ref 78.0–100.0)
PLATELETS: 242 10*3/uL (ref 150–400)
RBC: 4.68 MIL/uL (ref 3.87–5.11)
RDW: 16.2 % — AB (ref 11.5–15.5)
WBC: 8 10*3/uL (ref 4.0–10.5)

## 2016-03-31 LAB — BASIC METABOLIC PANEL
Anion gap: 12 (ref 5–15)
BUN: 14 mg/dL (ref 6–20)
CHLORIDE: 108 mmol/L (ref 101–111)
CO2: 21 mmol/L — AB (ref 22–32)
CREATININE: 0.92 mg/dL (ref 0.44–1.00)
Calcium: 9.4 mg/dL (ref 8.9–10.3)
GFR calc Af Amer: >60 mL/min (ref >60)
GFR calc non Af Amer: 58 mL/min — ABNORMAL LOW (ref >60)
Glucose, Bld: 123 mg/dL — ABNORMAL HIGH (ref 65–99)
Potassium: 3.5 mmol/L (ref 3.5–5.1)
Sodium: 141 mmol/L (ref 135–145)

## 2016-03-31 LAB — TSH: TSH: 9.097 u[IU]/mL — ABNORMAL HIGH (ref 0.350–4.500)

## 2016-03-31 LAB — T4, FREE: FREE T4: 0.89 ng/dL (ref 0.61–1.12)

## 2016-03-31 MED ORDER — MIRTAZAPINE 30 MG PO TABS
30.0000 mg | ORAL_TABLET | Freq: Every day | ORAL | Status: DC
  Administered 2016-03-31: 30 mg via ORAL
  Filled 2016-03-31: qty 1

## 2016-03-31 MED ORDER — CITALOPRAM HYDROBROMIDE 10 MG PO TABS
10.0000 mg | ORAL_TABLET | Freq: Every day | ORAL | Status: DC
  Administered 2016-03-31 – 2016-04-02 (×3): 10 mg via ORAL
  Filled 2016-03-31 (×3): qty 1

## 2016-03-31 MED ORDER — MENTHOL 3 MG MT LOZG
1.0000 | LOZENGE | OROMUCOSAL | Status: DC | PRN
  Administered 2016-03-31: 3 mg via ORAL
  Filled 2016-03-31: qty 9

## 2016-03-31 MED ORDER — LORAZEPAM 0.5 MG PO TABS
0.5000 mg | ORAL_TABLET | Freq: Two times a day (BID) | ORAL | Status: DC
  Administered 2016-03-31 – 2016-04-02 (×5): 0.5 mg via ORAL
  Filled 2016-03-31 (×5): qty 1

## 2016-03-31 MED ORDER — LORAZEPAM 1 MG PO TABS
1.0000 mg | ORAL_TABLET | Freq: Once | ORAL | Status: AC
  Administered 2016-03-31: 1 mg via ORAL
  Filled 2016-03-31: qty 1

## 2016-03-31 MED ORDER — LEVOTHYROXINE SODIUM 112 MCG PO TABS
112.0000 ug | ORAL_TABLET | Freq: Every day | ORAL | Status: DC
  Administered 2016-04-01 – 2016-04-02 (×2): 112 ug via ORAL
  Filled 2016-03-31 (×2): qty 1

## 2016-03-31 MED ORDER — HYDROCODONE-ACETAMINOPHEN 5-325 MG PO TABS
1.0000 | ORAL_TABLET | Freq: Four times a day (QID) | ORAL | Status: DC | PRN
  Administered 2016-03-31 – 2016-04-01 (×4): 2 via ORAL
  Administered 2016-04-01: 1 via ORAL
  Administered 2016-04-02: 2 via ORAL
  Filled 2016-03-31 (×3): qty 2
  Filled 2016-03-31: qty 1
  Filled 2016-03-31 (×2): qty 2

## 2016-03-31 NOTE — ED Notes (Signed)
Bed: [id number] Expected date:  Expected time:  Means of arrival:  Comments: Hold for 20

## 2016-03-31 NOTE — BH Assessment (Signed)
Assessment Note  Jody Porter is an 80 y.o. female. Pt presents at ED for the third time in 5 months with severe anxiety.  Pt was last seen on 02/28/16 and referred to Dr Donell BeersPlovsky.  Pt did follow through and began taking remron and ativan, however the anxiety has not improved.  Pt woke this morning feeling "terror" and reports that she feels terror on a daily basis currently.  This situation has been going on for 5 months, however pt and her daughters report it has gotten significantly worse in the past month.  Pt has always had trouble sleeping and this has improved with the current medication.  Pt denies SI/HI/AV.  Daughters report there have been comments regarding "I can't go on like this" but pt denies any intent to harm self.  Pt also had a brief period of psychosis several months ago but this cleared up quickly and has not returned.  Pt was placed on anti depressant when her husband died around 2000.  No other mental health history prior to the current anxiety.  No substance use reported.    Diagnosis: Generalized anxiety disorder  Past Medical History:  Past Medical History:  Diagnosis Date  . Allergic rhinitis   . Anxiety   . Celiac disease    Patient denies  . Degenerative arthritis   . Depression   . GERD (gastroesophageal reflux disease)   . Hyperparathyroidism   . Hypertension   . Hypothyroid   . Insomnia    w/ sleep apnea NPSG 07/13/2009 AHI 3.5, RDI 29.9/hr  . LBP (low back pain)   . Macular edema    OD  . Memory difficulties 01/27/2014  . Obesity   . Prediabetes   . Urinary incontinence     Past Surgical History:  Procedure Laterality Date  . carpel tunnel Bilateral   . cataracts    . CESAREAN SECTION    . CHOLECYSTECTOMY    . KNEE SURGERY Right    reports multiple surgery on knee  . LUMBAR DISC SURGERY    . PARATHYROIDECTOMY      Family History:  Family History  Problem Relation Age of Onset  . Cancer Mother   . Seizures Mother     grand mal  .  Cancer Father     prostate  . Arthritis/Rheumatoid Sister   . Cancer Paternal Grandmother   . Cancer Maternal Grandmother     Social History:  reports that she has never smoked. She has never used smokeless tobacco. She reports that she does not drink alcohol or use drugs.  Additional Social History:  Alcohol / Drug Use Pain Medications: pt denies all.  BAC negative History of alcohol / drug use?: No history of alcohol / drug abuse  CIWA: CIWA-Ar BP: 121/71 Pulse Rate: 80 COWS:    Allergies:  Allergies  Allergen Reactions  . Aricept [Donepezil Hcl] Other (See Comments)    Nightmares  . Sugar-Protein-Starch Other (See Comments)    Loses voice, congestion  . Ciprofloxacin Other (See Comments)    Stomach pains  . Prednisone Other (See Comments)    Marked mentation/ mood change  . Sudafed [Pseudoephedrine Hcl] Other (See Comments)    Hyperactive and agitated, congestion   . Valium [Diazepam] Other (See Comments)    Altered mental status changes-significant problems  . Hydroxyzine Other (See Comments)    Hallucinations   . Acyclovir Rash  . Penicillins Itching and Rash    Has patient had a PCN reaction causing  immediate rash, facial/tongue/throat swelling, SOB or lightheadedness with hypotension: Yes Has patient had a PCN reaction causing severe rash involving mucus membranes or skin necrosis: No Has patient had a PCN reaction that required hospitalization No Has patient had a PCN reaction occurring within the last 10 years: No If all of the above answers are "NO", then may proceed with Cephalosporin use.     Home Medications:  (Not in a hospital admission)  OB/GYN Status:  No LMP recorded. Patient is postmenopausal.  General Assessment Data Location of Assessment: [facility] TTS Assessment: In system Is this a Tele or Face-to-Face Assessment?: Face-to-Face Is this an Initial Assessment or a Re-assessment for this encounter?: Initial Assessment Marital status:  Widowed Is patient pregnant?: No Pregnancy Status: No Living Arrangements: Children (adult son in the home) Can pt return to current living arrangement?: Yes Referral Source: Self/Family/Friend     Crisis Care Plan Living Arrangements: Children (adult son in the home) Name of Psychiatrist: Plovsky Name of Therapist: none     Risk to self with the past 6 months Suicidal Ideation: No Has patient been a risk to self within the past 6 months prior to admission? : No Suicidal Intent: No Has patient had any suicidal intent within the past 6 months prior to admission? : No Is patient at risk for suicide?: No Suicidal Plan?: No Has patient had any suicidal plan within the past 6 months prior to admission? : No Access to Means: No What has been your use of drugs/alcohol within the last 12 months?: no use reported Previous Attempts/Gestures: No Intentional Self Injurious Behavior: None Family Suicide History: No Recent stressful life event(s): Other (Comment) (adult son with mental illness in home) Persecutory voices/beliefs?: No Depression: No Substance abuse history and/or treatment for substance abuse?: No  Risk to Others within the past 6 months Homicidal Ideation: No Does patient have any lifetime risk of violence toward others beyond the six months prior to admission? : No Thoughts of Harm to Others: No Current Homicidal Intent: No Current Homicidal Plan: No Access to Homicidal Means: No History of harm to others?: No Assessment of Violence: None Noted Does patient have access to weapons?: No Criminal Charges Pending?: No Does patient have a court date: No Is patient on probation?: No  Psychosis Hallucinations: None noted (psychosis several months ago--cleared up) Delusions: None noted  Mental Status Report Appearance/Hygiene: Unremarkable Eye Contact: Good Motor Activity: Agitation, Restlessness Speech: Logical/coherent Level of Consciousness: Alert Mood:  Anxious Affect: Anxious Anxiety Level: Severe Panic attack frequency: daily Most recent panic attack: current Thought Processes: Coherent, Relevant Judgement: Unimpaired Orientation: Person, Place, Time, Situation Obsessive Compulsive Thoughts/Behaviors: None  Cognitive Functioning Concentration: Normal Memory: Recent Intact, Remote Intact IQ: Average Insight: Fair Impulse Control: Good Appetite: Poor Weight Loss: 20 Weight Gain: 0 Sleep: No Change Total Hours of Sleep: 7 (has improved with medication recently) Vegetative Symptoms: None  ADLScreening [facility] Assessment Services) Patient's cognitive ability adequate to safely complete daily activities?: Yes Patient able to express need for assistance with ADLs?: Yes Independently performs ADLs?: Yes (appropriate for developmental age)  Prior Inpatient Therapy Prior Inpatient Therapy: No  Prior Outpatient Therapy Prior Outpatient Therapy: Yes Prior Therapy Dates: current Prior Therapy Facilty/Provider(s): Dr Donell Beers Reason for Treatment: medication Does patient have an ACCT team?: No Does patient have Intensive In-House Services?  : No Does patient have [facility] services? : No Does patient have P4CC services?: No  ADL Screening (condition at time of admission) Patient's cognitive ability adequate to  safely complete daily activities?: Yes Patient able to express need for assistance with ADLs?: Yes Independently performs ADLs?: Yes (appropriate for developmental age)       Abuse/Neglect Assessment (Assessment to be complete while patient is alone) Physical Abuse: Denies Verbal Abuse: Denies Sexual Abuse: Denies Exploitation of patient/patient's resources: Denies Self-Neglect: Denies     Merchant navy officerAdvance Directives (For Healthcare) Does patient have an advance directive?: No Would patient like information on creating an advanced directive?: No - patient declined information    Additional Information 1:1 In Past 12  Months?: No CIRT Risk: No Elopement Risk: No Does patient have medical clearance?: Yes     Disposition:  Disposition Initial Assessment Completed for this Encounter: Yes Disposition of Patient: Other dispositions  On Site Evaluation by:   Reviewed with Physician:    Lorri FrederickWierda, Antonios Ostrow Jon 03/31/2016 1:28 PM

## 2016-03-31 NOTE — ED Provider Notes (Signed)
[facility]-EMERGENCY DEPT Provider Note   CSN: [id number] Arrival date & time: 03/31/16  1003     History   Chief Complaint Chief Complaint  Patient presents with  . Anxiety    HPI Jody Porter is a 80 y.o. female.  The history is provided by the patient.  Anxiety  Pertinent negatives include no chest pain, no abdominal pain, no headaches and no shortness of breath.  Patient with hx anxiety, depression, c/o increasing anxiety in the past couple weeks. States when she awakes in AM feels terrified, but is not sure what she is afraid of.  States she feels worried, stressed, tearful throughout the day. Lives w son, and states being alone most of the day adds to her stress and anxiety. Has lost contact with most friends. Has trouble finding any source of happiness throughout the course of the day. Also states chronic right hip pain causes stress, and worries about possibly needing joint replacement. Denies thoughts of self harm. Is compliant w meds. States sees Dr Donell BeersPlovsky for same, but meds dont seem to help.  Denies involvement in other therapies, and/or specific activities, hobbies, or other stress relievers. +decreased appetite. Sleeps ok at night.  unspec few lb wt loss.     Past Medical History:  Diagnosis Date  . Allergic rhinitis   . Anxiety   . Celiac disease    Patient denies  . Degenerative arthritis   . Depression   . GERD (gastroesophageal reflux disease)   . Hyperparathyroidism   . Hypertension   . Hypothyroid   . Insomnia    w/ sleep apnea NPSG 07/13/2009 AHI 3.5, RDI 29.9/hr  . LBP (low back pain)   . Macular edema    OD  . Memory difficulties 01/27/2014  . Obesity   . Prediabetes   . Urinary incontinence     Patient Active Problem List   Diagnosis Date Noted  . Memory difficulties 01/27/2014  . Solitary pulmonary nodule 01/19/2014  . Other specified hypothyroidism 11/23/2013  . Depression 11/23/2013  . Essential hypertension, benign 11/23/2013  . Low  urine output 11/23/2013  . Other malaise and fatigue 11/23/2013  . Cough 05/12/2013  . Severe obesity (BMI >= 40) (HCC) 01/08/2013  . ALLERGIC RHINITIS 08/21/2009  . INSOMNIA WITH SLEEP APNEA UNSPECIFIED 08/18/2009  . Hypothyroidism 02/24/2007  . DEPRESSION 02/24/2007  . Essential hypertension 02/24/2007  . GERD 02/24/2007  . LOW BACK PAIN 02/24/2007  . HYPERPARATHYROIDISM, HX OF 02/24/2007    Past Surgical History:  Procedure Laterality Date  . carpel tunnel Bilateral   . cataracts    . CESAREAN SECTION    . CHOLECYSTECTOMY    . KNEE SURGERY Right    reports multiple surgery on knee  . LUMBAR DISC SURGERY    . PARATHYROIDECTOMY      OB History    No data available       Home Medications    Prior to Admission medications   Medication Sig Start Date End Date Taking? Authorizing Provider  acetaminophen (TYLENOL) 500 MG tablet Take 1,000 mg by mouth 2 (two) times daily as needed for mild pain.    Historical Provider, MD  acetaminophen-codeine (TYLENOL #3) 300-30 MG tablet Take 1 tablet by mouth every 8 (eight) hours as needed for moderate pain.  02/09/16   Historical Provider, MD  ALPRAZolam Prudy Feeler(XANAX) 0.5 MG tablet Take 0.5 mg by mouth 2 (two) times daily as needed for anxiety.  08/30/15   Historical Provider, MD  Biotin 1000  MCG tablet Take 1,000 mcg by mouth daily.      Historical Provider, MD  diphenhydrAMINE (BENADRYL) 25 mg capsule Take 50-100 mg by mouth at bedtime as needed for allergies or sleep.     Historical Provider, MD  DUREZOL 0.05 % EMUL Place 1 drop into the left eye 2 (two) times daily. 10/02/15   Historical Provider, MD  HYDROcodone-acetaminophen (NORCO/VICODIN) 5-325 MG tablet Take 1-2 tablets by mouth every 8 (eight) hours as needed for severe pain.  02/16/16   Historical Provider, MD  ibuprofen (ADVIL,MOTRIN) 200 MG tablet Take 800 mg by mouth 2 (two) times daily as needed for moderate pain.     Historical Provider, MD  levothyroxine (SYNTHROID, LEVOTHROID) 112  MCG tablet TAKE 1 TABLET (112 MCG TOTAL) BY MOUTH DAILY. 09/19/14   Sharon SellerJessica K Eubanks, NP  losartan (COZAAR) 25 MG tablet Take 2 tablets (50 mg total) by mouth daily. 04/14/14   Sharon SellerJessica K Eubanks, NP  Memantine HCl ER 28 MG CP24 Take 28 mg by mouth daily. 04/14/14   Sharon SellerJessica K Eubanks, NP  metFORMIN (GLUCOPHAGE) 500 MG tablet Take 500 mg by mouth daily. 12/30/14   Historical Provider, MD  simvastatin (ZOCOR) 20 MG tablet Take 20 mg by mouth at bedtime. 01/21/15   Historical Provider, MD  tobramycin (TOBREX) 0.3 % ophthalmic solution Place 1 drop into both eyes 4 (four) times daily. 10/02/15   Historical Provider, MD  VESICARE 5 MG tablet Take 5 mg by mouth daily. 01/15/15   Historical Provider, MD  Vitamin D, Ergocalciferol, (DRISDOL) 50000 UNITS CAPS capsule Take 50,000 Units by mouth every Friday. 01/21/15   Historical Provider, MD    Family History Family History  Problem Relation Age of Onset  . Cancer Mother   . Seizures Mother     grand mal  . Cancer Father     prostate  . Arthritis/Rheumatoid Sister   . Cancer Paternal Grandmother   . Cancer Maternal Grandmother     Social History Social History  Substance Use Topics  . Smoking status: Never Smoker  . Smokeless tobacco: Never Used  . Alcohol use No     Allergies   Aricept [donepezil hcl]; Sugar-protein-starch; Ciprofloxacin; Prednisone; Sudafed [pseudoephedrine hcl]; Valium [diazepam]; Hydroxyzine; Acyclovir; and Penicillins   Review of Systems Review of Systems  Constitutional: Negative for fever.  HENT: Negative for sore throat.   Eyes: Negative for redness.  Respiratory: Negative for shortness of breath.   Cardiovascular: Negative for chest pain.  Gastrointestinal: Negative for abdominal pain.  Genitourinary: Negative for flank pain.  Musculoskeletal: Negative for back pain.  Skin: Negative for rash.  Neurological: Negative for headaches.  Hematological: Does not bruise/bleed easily.  Psychiatric/Behavioral: The  patient is nervous/anxious.      Physical Exam Updated Vital Signs BP 110/69 (BP Location: Left Arm)   Pulse 60   Temp 97.5 F (36.4 C) (Oral)   Resp 22   Ht 4\' 10"  (1.473 m)   Wt 86.2 kg   SpO2 100%   BMI 39.71 kg/m   Physical Exam  Constitutional: She appears well-developed and well-nourished.  Anxious, tearful.   Eyes: Conjunctivae are normal. No scleral icterus.  Neck: Neck supple. No tracheal deviation present. No thyromegaly present.  Cardiovascular: Normal rate, regular rhythm, normal heart sounds and intact distal pulses.   Pulmonary/Chest: Effort normal and breath sounds normal. No respiratory distress.  Abdominal: Soft. Normal appearance. She exhibits no distension. There is no tenderness.  Musculoskeletal: She exhibits no edema.  Neurological:  She is alert.  Skin: Skin is warm and dry. No rash noted.  Psychiatric:  Anxious, tearful.   Nursing note and vitals reviewed.    ED Treatments / Results  Labs (all labs ordered are listed, but only abnormal results are displayed) Results for orders placed or performed during the hospital encounter of 03/31/16  TSH  Result Value Ref Range   TSH 9.097 (H) 0.350 - 4.500 uIU/mL  Basic metabolic panel  Result Value Ref Range   Sodium 141 135 - 145 mmol/L   Potassium 3.5 3.5 - 5.1 mmol/L   Chloride 108 101 - 111 mmol/L   CO2 21 (L) 22 - 32 mmol/L   Glucose, Bld 123 (H) 65 - 99 mg/dL   BUN 14 6 - 20 mg/dL   Creatinine, Ser 1.61 0.44 - 1.00 mg/dL   Calcium 9.4 8.9 - 09.6 mg/dL   GFR calc non Af Amer 58 (L) >60 mL/min   GFR calc Af Amer >60 >60 mL/min   Anion gap 12 5 - 15  CBC  Result Value Ref Range   WBC 8.0 4.0 - 10.5 K/uL   RBC 4.68 3.87 - 5.11 MIL/uL   Hemoglobin 13.6 12.0 - 15.0 g/dL   HCT 04.5 40.9 - 81.1 %   MCV 88.0 78.0 - 100.0 fL   MCH 29.1 26.0 - 34.0 pg   MCHC 33.0 30.0 - 36.0 g/dL   RDW 91.4 (H) 78.2 - 95.6 %   Platelets 242 150 - 400 K/uL   EKG  EKG Interpretation None        Radiology No results found.  Procedures Procedures (including critical care time)  Medications Ordered in ED Medications  LORazepam (ATIVAN) tablet 1 mg (not administered)     Initial Impression / Assessment and Plan / ED Course  I have reviewed the triage vital signs and the nursing notes.  Pertinent labs & imaging results that were available during my care of the patient were reviewed by me and considered in my medical decision making (see chart for details).  Clinical Course     Ativan po for anxiety.   [facility] team consulted.   Family feels they have tried outpatient tx, Dr Donell Beers, but that it is not helping, and feel patient is getting worse.   Reviewed nursing notes and prior charts for additional history.   BH evaluation pending - dispo per [facility] team.     Final Clinical Impressions(s) / ED Diagnoses   Final diagnoses:  None    New Prescriptions New Prescriptions   No medications on file     Cathren Laine, MD 03/31/16 1450

## 2016-03-31 NOTE — ED Triage Notes (Addendum)
Patient reports she woke up this morning "terrified." Patient has been dealing with anxiety for at least 4 months per patient's family member. Patient was referred to a psychiatrist and started on medications. However, patient is still having severe anxiety. Denies SI/HI.  Patient also complaining right hip pain starting about 1 month ago that she has already been seen for and started on pain medication.

## 2016-04-01 ENCOUNTER — Emergency Department (HOSPITAL_COMMUNITY): Payer: Medicare (Managed Care)

## 2016-04-01 DIAGNOSIS — F332 Major depressive disorder, recurrent severe without psychotic features: Secondary | ICD-10-CM

## 2016-04-01 DIAGNOSIS — Z79899 Other long term (current) drug therapy: Secondary | ICD-10-CM | POA: Diagnosis not present

## 2016-04-01 DIAGNOSIS — Z880 Allergy status to penicillin: Secondary | ICD-10-CM

## 2016-04-01 DIAGNOSIS — Z888 Allergy status to other drugs, medicaments and biological substances status: Secondary | ICD-10-CM | POA: Diagnosis not present

## 2016-04-01 DIAGNOSIS — Z8042 Family history of malignant neoplasm of prostate: Secondary | ICD-10-CM | POA: Diagnosis not present

## 2016-04-01 DIAGNOSIS — Z8261 Family history of arthritis: Secondary | ICD-10-CM | POA: Diagnosis not present

## 2016-04-01 DIAGNOSIS — Z808 Family history of malignant neoplasm of other organs or systems: Secondary | ICD-10-CM | POA: Diagnosis not present

## 2016-04-01 LAB — URINALYSIS, ROUTINE W REFLEX MICROSCOPIC
BILIRUBIN URINE: NEGATIVE
Glucose, UA: NEGATIVE mg/dL
Hgb urine dipstick: NEGATIVE
KETONES UR: NEGATIVE mg/dL
NITRITE: NEGATIVE
PH: 6 (ref 5.0–8.0)
PROTEIN: NEGATIVE mg/dL
Specific Gravity, Urine: 1.025 (ref 1.005–1.030)

## 2016-04-01 LAB — URINE MICROSCOPIC-ADD ON
BACTERIA UA: NONE SEEN
RBC / HPF: NONE SEEN RBC/hpf (ref 0–5)

## 2016-04-01 LAB — RAPID URINE DRUG SCREEN, HOSP PERFORMED
Amphetamines: NOT DETECTED
BARBITURATES: NOT DETECTED
BENZODIAZEPINES: POSITIVE — AB
COCAINE: NOT DETECTED
Opiates: POSITIVE — AB
Tetrahydrocannabinol: NOT DETECTED

## 2016-04-01 MED ORDER — MIRTAZAPINE 30 MG PO TABS
15.0000 mg | ORAL_TABLET | Freq: Every day | ORAL | Status: DC
  Administered 2016-04-01: 15 mg via ORAL
  Filled 2016-04-01: qty 1

## 2016-04-01 NOTE — Consult Note (Signed)
[facility] Consult   Reason for Consult:  Psychiatric Evaluation Referring Physician:  EDP Patient Identification: Jody Porter MRN:  [id number] Principal Diagnosis: MDD (major depressive disorder), recurrent episode, severe ([place]) Diagnosis:   Patient Active Problem List   Diagnosis Date Noted  . MDD (major depressive disorder), recurrent episode, severe ([place]) [F33.2] 04/01/2016  . Memory difficulties [R41.3] 01/27/2014  . Solitary pulmonary nodule [R91.1] 01/19/2014  . Other specified hypothyroidism [E03.8] 11/23/2013  . Depression [F32.9] 11/23/2013  . Essential hypertension, benign [I10] 11/23/2013  . Low urine output [R34] 11/23/2013  . Other malaise and fatigue [R53.81, R53.83] 11/23/2013  . Cough [R05] 05/12/2013  . Severe obesity (BMI >= 40) ([place]) [E66.01] 01/08/2013  . ALLERGIC RHINITIS [J30.9] 08/21/2009  . INSOMNIA WITH SLEEP APNEA UNSPECIFIED [G47.00, X32.44] 08/18/2009  . Hypothyroidism [E03.9] 02/24/2007  . DEPRESSION [F32.9] 02/24/2007  . Essential hypertension [I10] 02/24/2007  . GERD [K21.9] 02/24/2007  . LOW BACK PAIN [M54.5] 02/24/2007  . HYPERPARATHYROIDISM, HX OF [id number], Z86.2] 02/24/2007    Total Time spent with patient: 45 minutes  Subjective:   Jody Porter is a 80 y.o. female patient who states "I woke up yesterday in a terror."   HPI: Per behavioral health therapeutic triage, Jody Porter is an 80 y.o. female. Pt presents at ED for the third time in 5 months with severe anxiety.  Pt was last seen on 02/28/16 and referred to Dr Casimiro Needle.  Pt did follow through and began taking remron and ativan, however the anxiety has not improved.  Pt woke this morning feeling "terror" and reports that she feels terror on a daily basis currently.  This situation has been going on for 5 months, however pt and her daughters report it has gotten significantly worse in the past month.  Pt has always had trouble sleeping and this has  improved with the current medication.  Pt denies SI/HI/AV.  Daughters report there have been comments regarding "I can't go on like this" but pt denies any intent to harm self.  Pt also had a brief period of psychosis several months ago but this cleared up quickly and has not returned.  Pt was placed on anti depressant when her husband died around 1998-07-20.  No other mental health history prior to the current anxiety.  No substance use reported.   Evaluation on the unit: Chart and nursing notes reviewed. Face-to-face evaluation completed with Dr. Darleene Cleaver. The patient is alert, oriented x 4, anxious and cooperative. The patient's daughter is at bedside and the patient gives verbal consent for her daughter to remain present during the evaluation. The patient currently resides with her 21 yo son. The patient states she has been experiencing increasing anxiety for the past 5-6 months but she has been experiencing "panic" daily for the past month. The patient states "I don't know what I'm afraid of" and is unable to identify specific triggers for her symptoms but reports chronic pain and may possibly need to have a hip replacement. She denies depression but states "I am lonely and worries when no one is with me at all times." The patient endorses decreased appetite and reports intermittent sleep. The patient's daughter states the patient has been isolating herself from friends and has not been going out doing things like she once was. The patient is followed by Dr. Casimiro Needle who started the patient on Remeron and Ativan approximately a month ago and the patient has had little improvement in her symptoms. Today, she denies suicidal, homicidal  ideation, intent or plan. She denies AVH.  Past Psychiatric History: depression, anxiety  Risk to Self: Suicidal Ideation: No Suicidal Intent: No Is patient at risk for suicide?: No Suicidal Plan?: No Access to Means: No What has been your use of drugs/alcohol within the last  12 months?: no use reported Intentional Self Injurious Behavior: None Risk to Others: Homicidal Ideation: No Thoughts of Harm to Others: No Current Homicidal Intent: No Current Homicidal Plan: No Access to Homicidal Means: No History of harm to others?: No Assessment of Violence: None Noted Does patient have access to weapons?: No Criminal Charges Pending?: No Does patient have a court date: No Prior Inpatient Therapy: Prior Inpatient Therapy: No Prior Outpatient Therapy: Prior Outpatient Therapy: Yes Prior Therapy Dates: current Prior Therapy Facilty/Provider(s): Dr Casimiro Needle Reason for Treatment: medication Does patient have an ACCT team?: No Does patient have Intensive In-House Services?  : No Does patient have [facility] services? : No Does patient have P4CC services?: No  Past Medical History:  Past Medical History:  Diagnosis Date  . Allergic rhinitis   . Anxiety   . Celiac disease    Patient denies  . Degenerative arthritis   . Depression   . GERD (gastroesophageal reflux disease)   . Hyperparathyroidism   . Hypertension   . Hypothyroid   . Insomnia    w/ sleep apnea NPSG 07/13/2009 AHI 3.5, RDI 29.9/hr  . LBP (low back pain)   . Macular edema    OD  . Memory difficulties 01/27/2014  . Obesity   . Prediabetes   . Urinary incontinence     Past Surgical History:  Procedure Laterality Date  . carpel tunnel Bilateral   . cataracts    . CESAREAN SECTION    . CHOLECYSTECTOMY    . KNEE SURGERY Right    reports multiple surgery on knee  . LUMBAR DISC SURGERY    . PARATHYROIDECTOMY     Family History:  Family History  Problem Relation Age of Onset  . Cancer Mother   . Seizures Mother     grand mal  . Cancer Father     prostate  . Arthritis/Rheumatoid Sister   . Cancer Paternal Grandmother   . Cancer Maternal Grandmother    Family Psychiatric  History: unknown Social History:  History  Alcohol Use No     History  Drug Use No    Social History    Social History  . Marital status: Widowed    Spouse name: N/A  . Number of children: 4  . Years of education: 11th   Occupational History  . retired/disabled d/t knee injury    Social History Main Topics  . Smoking status: Never Smoker  . Smokeless tobacco: Never Used  . Alcohol use No  . Drug use: No  . Sexual activity: Not Asked   Other Topics Concern  . None   Social History Narrative   Pt is widowed and retired. Lives alone. Has children.   Additional Social History:    Allergies:   Allergies  Allergen Reactions  . Aricept [Donepezil Hcl] Other (See Comments)    Nightmares  . Sudafed [Pseudoephedrine Hcl] Other (See Comments)    Hyperactive and agitated Hyperactive and agitated, congestion   . Sugar-Protein-Starch Other (See Comments)    Loses voice, congestion  . Ciprofloxacin Other (See Comments)    Stomach pains  . Prednisone Other (See Comments)    Marked mentation/ mood change  . Valium [Diazepam] Other (See Comments)  Altered mental status changes-significant problems  . Hydroxyzine Other (See Comments)    Hallucinations   . Acyclovir Rash  . Penicillins Itching and Rash    Has patient had a PCN reaction causing immediate rash, facial/tongue/throat swelling, SOB or lightheadedness with hypotension: Yes Has patient had a PCN reaction causing severe rash involving mucus membranes or skin necrosis: No Has patient had a PCN reaction that required hospitalization No Has patient had a PCN reaction occurring within the last 10 years: No If all of the above answers are "NO", then may proceed with Cephalosporin use.     Labs:  Results for orders placed or performed during the hospital encounter of 03/31/16 (from the past 48 hour(s))  TSH     Status: Abnormal   Collection Time: 03/31/16  1:05 PM  Result Value Ref Range   TSH 9.097 (H) 0.350 - 4.500 uIU/mL    Comment: Performed by a 3rd Generation assay with a functional sensitivity of <=0.01 uIU/mL.   T4, free     Status: None   Collection Time: 03/31/16  1:28 PM  Result Value Ref Range   Free T4 0.89 0.61 - 1.12 ng/dL    Comment: (NOTE) Biotin ingestion may interfere with free T4 tests. If the results are inconsistent with the TSH level, previous test results, or the clinical presentation, then consider biotin interference. If needed, order repeat testing after stopping biotin. Performed at [facility] metabolic panel     Status: Abnormal   Collection Time: 03/31/16  1:28 PM  Result Value Ref Range   Sodium 141 135 - 145 mmol/L   Potassium 3.5 3.5 - 5.1 mmol/L   Chloride 108 101 - 111 mmol/L   CO2 21 (L) 22 - 32 mmol/L   Glucose, Bld 123 (H) 65 - 99 mg/dL   BUN 14 6 - 20 mg/dL   Creatinine, Ser 0.92 0.44 - 1.00 mg/dL   Calcium 9.4 8.9 - 10.3 mg/dL   GFR calc non Af Amer 58 (L) >60 mL/min   GFR calc Af Amer >60 >60 mL/min    Comment: (NOTE) The eGFR has been calculated using the CKD EPI equation. This calculation has not been validated in all clinical situations. eGFR's persistently <60 mL/min signify possible Chronic Kidney Disease.    Anion gap 12 5 - 15  CBC     Status: Abnormal   Collection Time: 03/31/16  1:28 PM  Result Value Ref Range   WBC 8.0 4.0 - 10.5 K/uL   RBC 4.68 3.87 - 5.11 MIL/uL   Hemoglobin 13.6 12.0 - 15.0 g/dL   HCT 41.2 36.0 - 46.0 %   MCV 88.0 78.0 - 100.0 fL   MCH 29.1 26.0 - 34.0 pg   MCHC 33.0 30.0 - 36.0 g/dL   RDW 16.2 (H) 11.5 - 15.5 %   Platelets 242 150 - 400 K/uL    Current Facility-Administered Medications  Medication Dose Route Frequency Provider Last Rate Last Dose  . citalopram (CELEXA) tablet 10 mg  10 mg Oral Daily Patrecia Pour, NP   10 mg at 04/01/16 1024  . HYDROcodone-acetaminophen (NORCO/VICODIN) 5-325 MG per tablet 1-2 tablet  1-2 tablet Oral Q6H PRN Duffy Bruce, MD   2 tablet at 04/01/16 0841  . levothyroxine (SYNTHROID, LEVOTHROID) tablet 112 mcg  112 mcg Oral QAC breakfast Lajean Saver, MD   112  mcg at 04/01/16 0836  . LORazepam (ATIVAN) tablet 0.5 mg  0.5 mg Oral BID Lajean Saver,  MD   0.5 mg at 04/01/16 0730  . menthol-cetylpyridinium (CEPACOL) lozenge 3 mg  1 lozenge Oral PRN Duffy Bruce, MD   3 mg at 03/31/16 2048  . mirtazapine (REMERON) tablet 15 mg  15 mg Oral QHS Corena Pilgrim, MD       Current Outpatient Prescriptions  Medication Sig Dispense Refill  . acetaminophen (TYLENOL) 500 MG tablet Take 1,000 mg by mouth 2 (two) times daily as needed for mild pain.    Marland Kitchen HYDROcodone-acetaminophen (NORCO/VICODIN) 5-325 MG tablet Take 1-2 tablets by mouth every 8 (eight) hours as needed for severe pain.   0  . LORazepam (ATIVAN) 0.5 MG tablet Take 0.5 mg by mouth 2 (two) times daily.  3  . mirtazapine (REMERON) 30 MG tablet Take 30 mg by mouth at bedtime.    . pantoprazole (PROTONIX) 20 MG tablet Take 20 mg by mouth daily.    Marland Kitchen levothyroxine (SYNTHROID, LEVOTHROID) 112 MCG tablet TAKE 1 TABLET (112 MCG TOTAL) BY MOUTH DAILY. (Patient not taking: Reported on 03/31/2016) 30 tablet 0  . losartan (COZAAR) 25 MG tablet Take 2 tablets (50 mg total) by mouth daily. (Patient not taking: Reported on 03/31/2016) 30 tablet 3  . Memantine HCl ER 28 MG CP24 Take 28 mg by mouth daily. (Patient not taking: Reported on 03/31/2016) 30 capsule 2  . metFORMIN (GLUCOPHAGE) 500 MG tablet Take 500 mg by mouth daily.  5  . simvastatin (ZOCOR) 20 MG tablet Take 20 mg by mouth at bedtime.  5    Musculoskeletal: Strength & Muscle Tone: unable to assess; patient in bed during evaluation Gait & Station: unable to assess; patient in bed during evaluation Patient leans: unable to assess; patient in bed during evaluation  Psychiatric Specialty Exam: Physical Exam  Nursing note and vitals reviewed.   Review of Systems  Constitutional: Negative.   HENT: Negative.   Gastrointestinal: Negative.   Genitourinary: Negative.   Musculoskeletal: Positive for joint pain.  Skin:       Ecchymoses noted to  forehead; due to patient "hit head on cabinet"  Neurological: Negative.   Endo/Heme/Allergies: Negative.   Psychiatric/Behavioral: Positive for depression. The patient is nervous/anxious and has insomnia.     Blood pressure 140/69, pulse 84, temperature 98.4 F (36.9 C), temperature source Oral, resp. rate 18, height '4\' 10"'$  (1.473 m), weight 86.2 kg (190 lb), SpO2 94 %.Body mass index is 39.71 kg/m.  General Appearance: Fairly Groomed  Eye Contact:  Good  Speech:  Clear and Coherent and Normal Rate  Volume:  Normal  Mood:  Anxious and Depressed  Affect:  Congruent  Thought Process:  Coherent  Orientation:  Full (Time, Place, and Person)  Thought Content:  Logical  Suicidal Thoughts:  No  Homicidal Thoughts:  No  Memory:  Immediate;   Good Recent;   Good  Judgement:  Intact  Insight:  Fair  Psychomotor Activity:  Decreased  Concentration:  Concentration: Fair and Attention Span: Fair  Recall:  [employer] of Knowledge:  Good  Language:  Good  Akathisia:  No  Handed:  Right  AIMS (if indicated):     Assets:  Communication Skills Desire for Improvement Financial Resources/Insurance Housing Social Support  ADL's:  Intact  Cognition:  WNL  Sleep:       Case discussed with Dr. Darleene Cleaver; recommendations are: Treatment Plan Summary: Daily contact with patient to assess and evaluate symptoms and progress in treatment and Medication management  Continue home medications as previously prescribed. Celexa  10 mg by mouth daily for depression. Ativan 0.5 mg mouth twice daily for anxiety. Remeron 15 mg by mouth daily at bedtime for insomnia.  Disposition: Recommend geropsychiatric inpatient admission when medically cleared  Serena Colonel, FNP-BC [place] 04/01/2016 10:51 AM  Patient seen face-to-face for psychiatric evaluation, chart reviewed and case discussed with the physician extender and developed treatment plan. Reviewed the information documented and agree  with the treatment plan. Corena Pilgrim, MD

## 2016-04-01 NOTE — ED Notes (Signed)
Patient is resting comfortably. 

## 2016-04-01 NOTE — BH Assessment (Signed)
[facility] Assessment Progress Note  Per Mojeed Akintayo, MD, this pt requires psychiatric hospitalization at this time.  The following facilities have been contacted to seek placement for this pt, with results as noted:  Beds available, information sent, decision pending:  [facility]   At capacity:  [facility] (no female geriatric beds)   Idona Stach, MA Triage Specialist [phone]      

## 2016-04-01 NOTE — ED Notes (Signed)
Family at bedside. 

## 2016-04-02 NOTE — ED Notes (Signed)
HELD VITAL SIGN ASSESSMENT, PT SLEEPING COMFORTABLY. PT IN NO APPARENT DISTRESS. RESPIRATIONS 16. WILL CONTINUE TO MONITOR.

## 2016-04-02 NOTE — ED Notes (Signed)
Due to the noise of the door opening and shutting patient became very upset and nervous. Made nurse aware of the situation.

## 2016-04-02 NOTE — Progress Notes (Signed)
[facility] will not accept report until 12:30. Will re attempt at specified time. Counselor notified.

## 2016-04-02 NOTE — BH Assessment (Addendum)
[facility] Assessment Progress Note  Per Thedore MinsMojeed Akintayo, MD, this pt requires psychiatric hospitalization at this time.  At 09:51 Delorise ShinerGrace calls from [facility] to report that pt has been accepted to their facility by Lowanda FosterBeverly Jones, MD.  Dr Jannifer FranklinAkintayo concurs with this decision. Pt has signed [facility]'s consent for admission form, and signed form has been faxed to [phone].  Pt's nurse has been notified, and agrees to send original paperwork along with pt via [facility], and to call report to [phone].  Doylene Canninghomas Shaydon Lease, MA Triage Specialist [phone]

## 2016-04-02 NOTE — Progress Notes (Signed)
Report called to nurse at [facility]. [facility] called for transport. Patient's son is at bedside. Patient is stable at discharge.

## 2016-04-02 NOTE — Progress Notes (Signed)
Daughter made aware of the plans for placement in [facility] via telephone. Patient talked with patient on telephone.

## 2016-07-19 ENCOUNTER — Encounter (HOSPITAL_COMMUNITY)
Admission: EM | Disposition: A | Discharge status: Home / Self Care | Payer: Self-pay | Source: Home / Self Care | Attending: Internal Medicine

## 2016-07-19 ENCOUNTER — Emergency Department (HOSPITAL_COMMUNITY): Payer: Medicare (Managed Care)

## 2016-07-19 ENCOUNTER — Observation Stay (HOSPITAL_COMMUNITY): Payer: Medicare (Managed Care) | Admitting: Certified Registered"

## 2016-07-19 ENCOUNTER — Encounter (HOSPITAL_COMMUNITY): Payer: Self-pay | Admitting: Emergency Medicine

## 2016-07-19 ENCOUNTER — Inpatient Hospital Stay (HOSPITAL_COMMUNITY)
Admission: EM | Admit: 2016-07-19 | Discharge: 2016-07-22 | DRG: 580 | Disposition: A | Discharge status: Home / Self Care | Payer: Medicare (Managed Care) | Attending: Internal Medicine | Admitting: Internal Medicine

## 2016-07-19 DIAGNOSIS — R7303 Prediabetes: Secondary | ICD-10-CM | POA: Diagnosis not present

## 2016-07-19 DIAGNOSIS — N3281 Overactive bladder: Secondary | ICD-10-CM | POA: Diagnosis present

## 2016-07-19 DIAGNOSIS — Z79899 Other long term (current) drug therapy: Secondary | ICD-10-CM

## 2016-07-19 DIAGNOSIS — G4730 Sleep apnea, unspecified: Secondary | ICD-10-CM | POA: Diagnosis present

## 2016-07-19 DIAGNOSIS — I10 Essential (primary) hypertension: Secondary | ICD-10-CM | POA: Diagnosis present

## 2016-07-19 DIAGNOSIS — Z6841 Body Mass Index (BMI) 40.0 and over, adult: Secondary | ICD-10-CM

## 2016-07-19 DIAGNOSIS — E039 Hypothyroidism, unspecified: Secondary | ICD-10-CM | POA: Diagnosis present

## 2016-07-19 DIAGNOSIS — F419 Anxiety disorder, unspecified: Secondary | ICD-10-CM | POA: Diagnosis present

## 2016-07-19 DIAGNOSIS — L03119 Cellulitis of unspecified part of limb: Secondary | ICD-10-CM | POA: Diagnosis not present

## 2016-07-19 DIAGNOSIS — G8929 Other chronic pain: Secondary | ICD-10-CM | POA: Diagnosis present

## 2016-07-19 DIAGNOSIS — Z7984 Long term (current) use of oral hypoglycemic drugs: Secondary | ICD-10-CM

## 2016-07-19 DIAGNOSIS — N3289 Other specified disorders of bladder: Secondary | ICD-10-CM | POA: Diagnosis present

## 2016-07-19 DIAGNOSIS — L03012 Cellulitis of left finger: Principal | ICD-10-CM | POA: Diagnosis present

## 2016-07-19 DIAGNOSIS — F329 Major depressive disorder, single episode, unspecified: Secondary | ICD-10-CM | POA: Diagnosis present

## 2016-07-19 DIAGNOSIS — M25559 Pain in unspecified hip: Secondary | ICD-10-CM | POA: Diagnosis present

## 2016-07-19 DIAGNOSIS — M79642 Pain in left hand: Secondary | ICD-10-CM | POA: Diagnosis not present

## 2016-07-19 DIAGNOSIS — K5900 Constipation, unspecified: Secondary | ICD-10-CM | POA: Diagnosis present

## 2016-07-19 DIAGNOSIS — A4902 Methicillin resistant Staphylococcus aureus infection, unspecified site: Secondary | ICD-10-CM

## 2016-07-19 DIAGNOSIS — L089 Local infection of the skin and subcutaneous tissue, unspecified: Secondary | ICD-10-CM | POA: Diagnosis present

## 2016-07-19 DIAGNOSIS — B9562 Methicillin resistant Staphylococcus aureus infection as the cause of diseases classified elsewhere: Secondary | ICD-10-CM | POA: Diagnosis present

## 2016-07-19 HISTORY — PX: I & D EXTREMITY: SHX5045

## 2016-07-19 LAB — COMPREHENSIVE METABOLIC PANEL
ALK PHOS: 114 U/L (ref 38–126)
ALT: 16 U/L (ref 14–54)
ANION GAP: 8 (ref 5–15)
AST: 21 U/L (ref 15–41)
Albumin: 3.8 g/dL (ref 3.5–5.0)
BILIRUBIN TOTAL: 0.5 mg/dL (ref 0.3–1.2)
BUN: 23 mg/dL — ABNORMAL HIGH (ref 6–20)
CALCIUM: 9.7 mg/dL (ref 8.9–10.3)
CO2: 27 mmol/L (ref 22–32)
Chloride: 104 mmol/L (ref 101–111)
Creatinine, Ser: 1.06 mg/dL — ABNORMAL HIGH (ref 0.44–1.00)
GFR calc Af Amer: 57 mL/min — ABNORMAL LOW (ref >60)
GFR, EST NON AFRICAN AMERICAN: 49 mL/min — AB (ref >60)
Glucose, Bld: 162 mg/dL — ABNORMAL HIGH (ref 65–99)
POTASSIUM: 3.6 mmol/L (ref 3.5–5.1)
Sodium: 139 mmol/L (ref 135–145)
TOTAL PROTEIN: 7 g/dL (ref 6.5–8.1)

## 2016-07-19 LAB — CBC WITH DIFFERENTIAL/PLATELET
BASOS PCT: 0 %
Basophils Absolute: 0 10*3/uL (ref 0.0–0.1)
Eosinophils Absolute: 0.2 10*3/uL (ref 0.0–0.7)
Eosinophils Relative: 2 %
HEMATOCRIT: 40.1 % (ref 36.0–46.0)
HEMOGLOBIN: 12.9 g/dL (ref 12.0–15.0)
LYMPHS ABS: 2.4 10*3/uL (ref 0.7–4.0)
Lymphocytes Relative: 22 %
MCH: 27.4 pg (ref 26.0–34.0)
MCHC: 32.2 g/dL (ref 30.0–36.0)
MCV: 85.3 fL (ref 78.0–100.0)
MONO ABS: 1.1 10*3/uL — AB (ref 0.1–1.0)
MONOS PCT: 10 %
NEUTROS ABS: 6.9 10*3/uL (ref 1.7–7.7)
NEUTROS PCT: 66 %
Platelets: 200 10*3/uL (ref 150–400)
RBC: 4.7 MIL/uL (ref 3.87–5.11)
RDW: 15 % (ref 11.5–15.5)
WBC: 10.6 10*3/uL — ABNORMAL HIGH (ref 4.0–10.5)

## 2016-07-19 LAB — LACTIC ACID, PLASMA
Lactic Acid, Venous: 1.8 mmol/L (ref 0.5–1.9)
Lactic Acid, Venous: 1.9 mmol/L (ref 0.5–1.9)

## 2016-07-19 SURGERY — IRRIGATION AND DEBRIDEMENT EXTREMITY
Anesthesia: General | Site: Finger | Laterality: Left

## 2016-07-19 MED ORDER — LIDOCAINE 2% (20 MG/ML) 5 ML SYRINGE
INTRAMUSCULAR | Status: AC
  Filled 2016-07-19: qty 5

## 2016-07-19 MED ORDER — PROPOFOL 10 MG/ML IV BOLUS
INTRAVENOUS | Status: DC | PRN
  Administered 2016-07-19: 200 mg via INTRAVENOUS

## 2016-07-19 MED ORDER — ONDANSETRON HCL 4 MG/2ML IJ SOLN
INTRAMUSCULAR | Status: DC | PRN
Start: 2016-07-19 — End: 2016-07-20
  Administered 2016-07-19: 4 mg via INTRAVENOUS

## 2016-07-19 MED ORDER — SODIUM CHLORIDE 0.9 % IV BOLUS (SEPSIS)
1000.0000 mL | Freq: Once | INTRAVENOUS | Status: AC
  Administered 2016-07-19: 1000 mL via INTRAVENOUS

## 2016-07-19 MED ORDER — BUPIVACAINE HCL (PF) 0.5 % IJ SOLN
INTRAMUSCULAR | Status: DC | PRN
  Administered 2016-07-19: 10 mL

## 2016-07-19 MED ORDER — MORPHINE SULFATE (PF) 4 MG/ML IV SOLN
2.0000 mg | INTRAVENOUS | Status: DC | PRN
  Administered 2016-07-20: 2 mg via INTRAVENOUS
  Filled 2016-07-19 (×2): qty 1

## 2016-07-19 MED ORDER — LABETALOL HCL 5 MG/ML IV SOLN
INTRAVENOUS | Status: AC
  Filled 2016-07-19: qty 4

## 2016-07-19 MED ORDER — MORPHINE SULFATE (PF) 4 MG/ML IV SOLN
2.0000 mg | Freq: Once | INTRAVENOUS | Status: AC
  Administered 2016-07-19: 2 mg via INTRAVENOUS
  Filled 2016-07-19 (×2): qty 1

## 2016-07-19 MED ORDER — 0.9 % SODIUM CHLORIDE (POUR BTL) OPTIME
TOPICAL | Status: DC | PRN
  Administered 2016-07-19: 1000 mL

## 2016-07-19 MED ORDER — LABETALOL HCL 5 MG/ML IV SOLN
INTRAVENOUS | Status: DC | PRN
  Administered 2016-07-19 (×2): 2.5 mg via INTRAVENOUS

## 2016-07-19 MED ORDER — SODIUM CHLORIDE 0.9 % IV SOLN
INTRAVENOUS | Status: DC | PRN
  Administered 2016-07-19: 1000 mg via INTRAVENOUS

## 2016-07-19 MED ORDER — BUPIVACAINE HCL (PF) 0.5 % IJ SOLN
INTRAMUSCULAR | Status: AC
  Filled 2016-07-19: qty 30

## 2016-07-19 MED ORDER — LIDOCAINE 2% (20 MG/ML) 5 ML SYRINGE
INTRAMUSCULAR | Status: DC | PRN
Start: 2016-07-19 — End: 2016-07-20
  Administered 2016-07-19: 100 mg via INTRAVENOUS

## 2016-07-19 MED ORDER — LORAZEPAM 0.5 MG PO TABS
0.5000 mg | ORAL_TABLET | Freq: Three times a day (TID) | ORAL | Status: DC
  Administered 2016-07-19 – 2016-07-22 (×8): 0.5 mg via ORAL
  Filled 2016-07-19 (×7): qty 1

## 2016-07-19 MED ORDER — FENTANYL CITRATE (PF) 100 MCG/2ML IJ SOLN
25.0000 ug | INTRAMUSCULAR | Status: DC | PRN
  Administered 2016-07-19 (×3): 25 ug via INTRAVENOUS

## 2016-07-19 MED ORDER — LACTATED RINGERS IV SOLN
INTRAVENOUS | Status: DC | PRN
  Administered 2016-07-19: 22:00:00 via INTRAVENOUS

## 2016-07-19 MED ORDER — PROMETHAZINE HCL 25 MG/ML IJ SOLN: 6.2500 mg | INTRAMUSCULAR | Status: DC | PRN

## 2016-07-19 MED ORDER — CLINDAMYCIN PHOSPHATE 600 MG/50ML IV SOLN
600.0000 mg | Freq: Once | INTRAVENOUS | Status: AC
  Administered 2016-07-19: 600 mg via INTRAVENOUS
  Filled 2016-07-19: qty 50

## 2016-07-19 MED ORDER — SUCCINYLCHOLINE CHLORIDE 200 MG/10ML IV SOSY
PREFILLED_SYRINGE | INTRAVENOUS | Status: AC
  Filled 2016-07-19: qty 20

## 2016-07-19 MED ORDER — ONDANSETRON HCL 4 MG/2ML IJ SOLN
INTRAMUSCULAR | Status: AC
  Filled 2016-07-19: qty 2

## 2016-07-19 MED ORDER — SUCCINYLCHOLINE CHLORIDE 200 MG/10ML IV SOSY
PREFILLED_SYRINGE | INTRAVENOUS | Status: DC | PRN
  Administered 2016-07-19: 120 mg via INTRAVENOUS

## 2016-07-19 MED ORDER — FENTANYL CITRATE (PF) 100 MCG/2ML IJ SOLN
INTRAMUSCULAR | Status: DC | PRN
  Administered 2016-07-19 (×2): 50 ug via INTRAVENOUS

## 2016-07-19 MED ORDER — FENTANYL CITRATE (PF) 100 MCG/2ML IJ SOLN
INTRAMUSCULAR | Status: AC
  Filled 2016-07-19: qty 2

## 2016-07-19 MED ORDER — KETOROLAC TROMETHAMINE 30 MG/ML IJ SOLN: 15.0000 mg | Freq: Once | INTRAMUSCULAR | Status: AC | PRN

## 2016-07-19 MED ORDER — OXYCODONE HCL 5 MG PO TABS: 5.0000 mg | ORAL_TABLET | Freq: Once | ORAL | Status: DC | PRN

## 2016-07-19 MED ORDER — MIDAZOLAM HCL 2 MG/2ML IJ SOLN
INTRAMUSCULAR | Status: AC
Start: 2016-07-19 — End: 2016-07-19
  Filled 2016-07-19: qty 2

## 2016-07-19 MED ORDER — OXYCODONE HCL 5 MG/5ML PO SOLN
5.0000 mg | Freq: Once | ORAL | Status: DC | PRN
  Filled 2016-07-19: qty 5

## 2016-07-19 MED ORDER — VANCOMYCIN HCL IN DEXTROSE 1-5 GM/200ML-% IV SOLN
INTRAVENOUS | Status: AC
  Filled 2016-07-19: qty 200

## 2016-07-19 MED ORDER — PROPOFOL 10 MG/ML IV BOLUS
INTRAVENOUS | Status: AC
  Filled 2016-07-19: qty 20

## 2016-07-19 SURGICAL SUPPLY — 43 items
BAG SPEC THK2 15X12 ZIP CLS (MISCELLANEOUS) ×1
BAG ZIPLOCK 12X15 (MISCELLANEOUS) ×2 IMPLANT
BANDAGE ACE 4X5 VEL STRL LF (GAUZE/BANDAGES/DRESSINGS) ×1 IMPLANT
BNDG CMPR 9X4 STRL LF SNTH (GAUZE/BANDAGES/DRESSINGS) ×1
BNDG COHESIVE 1X5 TAN STRL LF (GAUZE/BANDAGES/DRESSINGS) ×1 IMPLANT
BNDG COHESIVE 4X5 TAN STRL (GAUZE/BANDAGES/DRESSINGS) ×1 IMPLANT
BNDG ESMARK 4X9 LF (GAUZE/BANDAGES/DRESSINGS) ×1 IMPLANT
BNDG GAUZE ELAST 4 BULKY (GAUZE/BANDAGES/DRESSINGS) ×1 IMPLANT
CANNULA VESSEL W/WING WO/VALVE (CANNULA) ×1 IMPLANT
CLEANER TIP ELECTROSURG 2X2 (MISCELLANEOUS) ×1 IMPLANT
CORDS BIPOLAR (ELECTRODE) ×2 IMPLANT
CUFF TOURN SGL QUICK 18 (TOURNIQUET CUFF) ×2 IMPLANT
CUFF TOURN SGL QUICK 24 (TOURNIQUET CUFF)
CUFF TRNQT CYL 24X4X40X1 (TOURNIQUET CUFF) ×1 IMPLANT
DRAIN PENROSE 18X1/2 LTX STRL (DRAIN) ×2 IMPLANT
DRSG PAD ABDOMINAL 8X10 ST (GAUZE/BANDAGES/DRESSINGS) ×1 IMPLANT
ELECT REM PT RETURN 9FT ADLT (ELECTROSURGICAL)
ELECTRODE REM PT RTRN 9FT ADLT (ELECTROSURGICAL) ×1 IMPLANT
GAUZE IODOFORM PACK 1/2 7832 (GAUZE/BANDAGES/DRESSINGS) ×1 IMPLANT
GAUZE PACKING IODOFORM 1/4X15 (GAUZE/BANDAGES/DRESSINGS) ×2 IMPLANT
GAUZE SPONGE 4X4 12PLY STRL (GAUZE/BANDAGES/DRESSINGS) ×2 IMPLANT
GAUZE XEROFORM 1X8 LF (GAUZE/BANDAGES/DRESSINGS) ×1 IMPLANT
GAUZE XEROFORM 5X9 LF (GAUZE/BANDAGES/DRESSINGS) ×1 IMPLANT
GLOVE BIO SURGEON STRL SZ7.5 (GLOVE) ×4 IMPLANT
GLOVE BIOGEL PI IND STRL 7.5 (GLOVE) IMPLANT
GLOVE BIOGEL PI IND STRL 8 (GLOVE) ×1 IMPLANT
GLOVE BIOGEL PI INDICATOR 7.5 (GLOVE) ×1
GLOVE BIOGEL PI INDICATOR 8 (GLOVE) ×1
GLOVE SURG SS PI 7.5 STRL IVOR (GLOVE) ×1 IMPLANT
GOWN SPEC L3 XXLG W/TWL (GOWN DISPOSABLE) ×3 IMPLANT
HANDPIECE INTERPULSE COAX TIP (DISPOSABLE)
KIT BASIN OR (CUSTOM PROCEDURE TRAY) ×2 IMPLANT
MANIFOLD NEPTUNE II (INSTRUMENTS) ×2 IMPLANT
PACK ORTHO EXTREMITY (CUSTOM PROCEDURE TRAY) ×2 IMPLANT
PAD CAST 4YDX4 CTTN HI CHSV (CAST SUPPLIES) ×1 IMPLANT
PADDING CAST COTTON 4X4 STRL (CAST SUPPLIES)
SET HNDPC FAN SPRY TIP SCT (DISPOSABLE) ×1 IMPLANT
SUT ETHILON 4 0 PS 2 18 (SUTURE) ×1 IMPLANT
SUT SILK 4 0 PS 2 (SUTURE) ×1 IMPLANT
SWAB COLLECTION DEVICE MRSA (MISCELLANEOUS) ×1 IMPLANT
SYR 20CC LL (SYRINGE) ×2 IMPLANT
SYR CONTROL 10ML LL (SYRINGE) ×2 IMPLANT
TUBE ANAEROBIC SPECIMEN COL (MISCELLANEOUS) ×1 IMPLANT

## 2016-07-19 NOTE — Anesthesia Procedure Notes (Signed)
Procedure Name: Intubation Date/Time: 07/19/2016 10:01 PM Performed by: Minerva EndsMIRARCHI, Juley Giovanetti M Pre-anesthesia Checklist: Patient identified, Emergency Drugs available, Suction available and Patient being monitored Patient Re-evaluated:Patient Re-evaluated prior to inductionOxygen Delivery Method: Circle System Utilized Preoxygenation: Pre-oxygenation with 100% oxygen Intubation Type: IV induction Ventilation: Mask ventilation without difficulty Laryngoscope Size: Miller and 2 Tube type: Oral Number of attempts: 1 Airway Equipment and Method: Stylet Placement Confirmation: ETT inserted through vocal cords under direct vision,  positive ETCO2 and breath sounds checked- equal and bilateral Secured at: 22 cm Tube secured with: Tape Dental Injury: Teeth and Oropharynx as per pre-operative assessment  Comments: Smooth IV induction--- intubation AM CRNA atraumatic teeth and mouth as preop--- very poor dentition- many missing and chipped teeth. Bilat BS Rose

## 2016-07-19 NOTE — H&P (Signed)
Jody Porter is an 81 y.o. female.   Chief Complaint: left long finger infection HPI: 81 yo female with left long finger infection over past two days.  Present with son.  They state it started dorsal to nail with a white bump that has progressively worsened to erythema and drainage.  No fevers or sweats.  Some chills.  Throbbing pain of 8/10 severity.  Alleviated by nothing, worsened with palpation.  Case discussed with Dr. Dayna Barker and his note from 07/19/2016 reviewed. Xrays viewed and interpreted by me: ap lateral and oblique views of finger show no fracture, dislocation, radioopaque foreign body Labs reviewed: WBC 10.6  Allergies:  Allergies  Allergen Reactions  . Aricept [Donepezil Hcl] Other (See Comments)    Nightmares  . Sudafed [Pseudoephedrine Hcl] Other (See Comments)    Hyperactive and agitated Hyperactive and agitated, congestion   . Sugar-Protein-Starch Other (See Comments)    Loses voice, congestion  . Ciprofloxacin Other (See Comments)    Stomach pains  . Prednisone Other (See Comments)    Marked mentation/ mood change  . Valium [Diazepam] Other (See Comments)    Altered mental status changes-significant problems  . Hydroxyzine Other (See Comments)    Hallucinations   . Other     PT IS OF [place]. NO BLOOD PRODUCTS.   Marland Kitchen Acyclovir Rash  . Penicillins Itching and Rash    Has patient had a PCN reaction causing immediate rash, facial/tongue/throat swelling, SOB or lightheadedness with hypotension: Yes Has patient had a PCN reaction causing severe rash involving mucus membranes or skin necrosis: No Has patient had a PCN reaction that required hospitalization No Has patient had a PCN reaction occurring within the last 10 years: No If all of the above answers are "NO", then may proceed with Cephalosporin use.     Past Medical History:  Diagnosis Date  . Allergic rhinitis   . Anxiety   . Celiac disease    Patient denies  . Degenerative  arthritis   . Depression   . GERD (gastroesophageal reflux disease)   . Hyperparathyroidism   . Hypertension   . Hypothyroid   . Insomnia    w/ sleep apnea NPSG 07/13/2009 AHI 3.5, RDI 29.9/hr  . LBP (low back pain)   . Macular edema    OD  . Memory difficulties 01/27/2014  . Obesity   . Prediabetes   . Urinary incontinence     Past Surgical History:  Procedure Laterality Date  . carpel tunnel Bilateral   . cataracts    . CESAREAN SECTION    . CHOLECYSTECTOMY    . KNEE SURGERY Right    reports multiple surgery on knee  . LUMBAR DISC SURGERY    . PARATHYROIDECTOMY      Family History: Family History  Problem Relation Age of Onset  . Cancer Mother   . Seizures Mother     grand mal  . Cancer Father     prostate  . Arthritis/Rheumatoid Sister   . Cancer Paternal Grandmother   . Cancer Maternal Grandmother     Social History:   reports that she has never smoked. She has never used smokeless tobacco. She reports that she does not drink alcohol or use drugs.  Medications:  (Not in a hospital admission)  Results for orders placed or performed during the hospital encounter of 07/19/16 (from the past 48 hour(s))  CBC with Differential     Status: Abnormal   Collection Time: 07/19/16  5:25 PM  Result Value Ref Range   WBC 10.6 (H) 4.0 - 10.5 K/uL   RBC 4.70 3.87 - 5.11 MIL/uL   Hemoglobin 12.9 12.0 - 15.0 g/dL   HCT 40.1 36.0 - 46.0 %   MCV 85.3 78.0 - 100.0 fL   MCH 27.4 26.0 - 34.0 pg   MCHC 32.2 30.0 - 36.0 g/dL   RDW 15.0 11.5 - 15.5 %   Platelets 200 150 - 400 K/uL   Neutrophils Relative % 66 %   Neutro Abs 6.9 1.7 - 7.7 K/uL   Lymphocytes Relative 22 %   Lymphs Abs 2.4 0.7 - 4.0 K/uL   Monocytes Relative 10 %   Monocytes Absolute 1.1 (H) 0.1 - 1.0 K/uL   Eosinophils Relative 2 %   Eosinophils Absolute 0.2 0.0 - 0.7 K/uL   Basophils Relative 0 %   Basophils Absolute 0.0 0.0 - 0.1 K/uL  Comprehensive metabolic panel     Status: Abnormal   Collection  Time: 07/19/16  5:25 PM  Result Value Ref Range   Sodium 139 135 - 145 mmol/L   Potassium 3.6 3.5 - 5.1 mmol/L   Chloride 104 101 - 111 mmol/L   CO2 27 22 - 32 mmol/L   Glucose, Bld 162 (H) 65 - 99 mg/dL   BUN 23 (H) 6 - 20 mg/dL   Creatinine, Ser 1.06 (H) 0.44 - 1.00 mg/dL   Calcium 9.7 8.9 - 10.3 mg/dL   Total Protein 7.0 6.5 - 8.1 g/dL   Albumin 3.8 3.5 - 5.0 g/dL   AST 21 15 - 41 U/L   ALT 16 14 - 54 U/L   Alkaline Phosphatase 114 38 - 126 U/L   Total Bilirubin 0.5 0.3 - 1.2 mg/dL   GFR calc non Af Amer 49 (L) >60 mL/min   GFR calc Af Amer 57 (L) >60 mL/min    Comment: (NOTE) The eGFR has been calculated using the CKD EPI equation. This calculation has not been validated in all clinical situations. eGFR's persistently <60 mL/min signify possible Chronic Kidney Disease.    Anion gap 8 5 - 15  Lactic acid, plasma     Status: None   Collection Time: 07/19/16  5:26 PM  Result Value Ref Range   Lactic Acid, Venous 1.8 0.5 - 1.9 mmol/L  Lactic acid, plasma     Status: None   Collection Time: 07/19/16  8:16 PM  Result Value Ref Range   Lactic Acid, Venous 1.9 0.5 - 1.9 mmol/L    Dg Hand Complete Left  Result Date: 07/19/2016 CLINICAL DATA:  Bloody discharge from left middle finger for 2 days. EXAM: LEFT HAND - COMPLETE 3+ VIEW COMPARISON:  None. FINDINGS: No fracture. No subluxation or dislocation. Degenerative changes are seen in scattered IP joints. Soft tissue swelling noted at the tip of the middle finger without underlying bony erosion or destruction to suggest overt osteomyelitis. No evidence for radiopaque soft tissue foreign body. IMPRESSION: Soft tissue swelling distal aspect of middle finger without underlying bony abnormality. Electronically Signed   By: Misty Stanley M.D.   On: 07/19/2016 17:50     Pertinent items noted in HPI and remainder of comprehensive ROS otherwise negative. Review of Systems: No fevers, night sweats, chest pain, shortness of breath, nausea,  vomiting, diarrhea, constipation, easy bleeding or bruising, headaches, dizziness, vision changes, fainting.   Blood pressure 160/76, pulse 95, temperature 98.2 F (36.8 C), temperature source Oral, resp. rate 11, height '4\' 9"'$  (1.448 m), weight 90.7 kg (  200 lb), SpO2 97 %.  General appearance: alert, cooperative and appears stated age Head: Normocephalic, without obvious abnormality, atraumatic Neck: supple, symmetrical, trachea midline Extremities: Intact sensation and capillary refill all digits.  +epl/fpl/io.  Left long with erythema distally, purulent drainage.  Some skin slough radially.  ttp distal phalanx.  No tenderness proximal or middle phalanges.  Faint erythematous streak into forearm. Pulses: 2+ and symmetric Skin: Skin color, texture, turgor normal. No rashes or lesions Neurologic: Grossly normal Incision/Wound: As above  Assessment/Plan Left long finger infection.  Recommend OR for incision and drainage with likely removal of nail.  Risks, benefits, and alternatives of surgery were discussed and the patient agrees with the plan of care.   Lewie Deman R 07/19/2016, 9:25 PM

## 2016-07-19 NOTE — H&P (Signed)
Jody Porter ZOX:[id number] DOB: 1935/05/14 DOA: 07/19/2016     PCP: Pcp Not In System   Outpatient Specialists: [facility] Patient coming from:  home Lives  With family    Chief Complaint: Left finger swelling and pain  HPI: Jody Porter is a 81 y.o. female with medical history significant of hypothyroidism overreactive bladder hypertension memory deficits or depression    Presented with 2 days ago she noticed a blister on her left middle finger cyst and blister has popped and she has had worsening swelling pain desquamation and darkening of the skin she has attempted to use cold compresses and rest but did not seem to help. Denies any injury, no exposure to insects.  Reports chronic hip pain. Patient is prediabetic but no longer  Taking metformin.        IN ER:  Temp (24hrs), Avg:98.2 F (36.8 C), Min:98.2 F (36.8 C), Max:98.2 F (36.8 C)   RR 1897% BP 143/75 Lactic acid 1.8 WBC 10.6 Hg 12.9 Na 139 K 3.6 Cr 1.06 Plan imaging unremarkable Following Medications were ordered in ER: Medications  clindamycin (CLEOCIN) IVPB 600 mg (not administered)  sodium chloride 0.9 % bolus 1,000 mL (not administered)      Hospitalist was called for admission for Left hand finger cellulitis  Review of Systems:    Pertinent positives include: chills  Constitutional:  No weight loss, night sweats, Fevers, , fatigue, weight loss  HEENT:  No headaches, Difficulty swallowing,Tooth/dental problems,Sore throat,  No sneezing, itching, ear ache, nasal congestion, post nasal drip,  Cardio-vascular:  No chest pain, Orthopnea, PND, anasarca, dizziness, palpitations.no Bilateral lower extremity swelling  GI:  No heartburn, indigestion, abdominal pain, nausea, vomiting, diarrhea, change in bowel habits, loss of appetite, melena, blood in stool, hematemesis Resp:  no shortness of breath at rest. No dyspnea on exertion, No excess mucus, no productive cough, No  non-productive cough, No coughing up of blood.No change in color of mucus.No wheezing. Skin:  no rash or lesions. No jaundice GU:  no dysuria, change in color of urine, no urgency or frequency. No straining to urinate.  No flank pain.  Musculoskeletal:  No joint pain or no joint swelling. No decreased range of motion. No back pain.  Psych:  No change in mood or affect. No depression or anxiety. No memory loss.  Neuro: no localizing neurological complaints, no tingling, no weakness, no double vision, no gait abnormality, no slurred speech, no confusion  As per HPI otherwise 10 point review of systems negative.   Past Medical History: Past Medical History:  Diagnosis Date  . Allergic rhinitis   . Anxiety   . Celiac disease    Patient denies  . Degenerative arthritis   . Depression   . GERD (gastroesophageal reflux disease)   . Hyperparathyroidism   . Hypertension   . Hypothyroid   . Insomnia    w/ sleep apnea NPSG 07/13/2009 AHI 3.5, RDI 29.9/hr  . LBP (low back pain)   . Macular edema    OD  . Memory difficulties 01/27/2014  . Obesity   . Prediabetes   . Urinary incontinence    Past Surgical History:  Procedure Laterality Date  . carpel tunnel Bilateral   . cataracts    . CESAREAN SECTION    . CHOLECYSTECTOMY    . KNEE SURGERY Right    reports multiple surgery on knee  . LUMBAR DISC SURGERY    . PARATHYROIDECTOMY       Social  History:  Ambulatory   independently      reports that she has never smoked. She has never used smokeless tobacco. She reports that she does not drink alcohol or use drugs.  Allergies:   Allergies  Allergen Reactions  . Aricept [Donepezil Hcl] Other (See Comments)    Nightmares  . Sudafed [Pseudoephedrine Hcl] Other (See Comments)    Hyperactive and agitated Hyperactive and agitated, congestion   . Sugar-Protein-Starch Other (See Comments)    Loses voice, congestion  . Ciprofloxacin Other (See Comments)    Stomach pains  .  Prednisone Other (See Comments)    Marked mentation/ mood change  . Valium [Diazepam] Other (See Comments)    Altered mental status changes-significant problems  . Hydroxyzine Other (See Comments)    Hallucinations   . Other     PT IS OF JEHOVAH WITNESS FAITH. NO BLOOD PRODUCTS.   Marland Kitchen. Acyclovir Rash  . Penicillins Itching and Rash    Has patient had a PCN reaction causing immediate rash, facial/tongue/throat swelling, SOB or lightheadedness with hypotension: Yes Has patient had a PCN reaction causing severe rash involving mucus membranes or skin necrosis: No Has patient had a PCN reaction that required hospitalization No Has patient had a PCN reaction occurring within the last 10 years: No If all of the above answers are "NO", then may proceed with Cephalosporin use.        Family History:   Family History  Problem Relation Age of Onset  . Cancer Mother   . Seizures Mother     grand mal  . Cancer Father     prostate  . Arthritis/Rheumatoid Sister   . Cancer Paternal Grandmother   . Cancer Maternal Grandmother     Medications: Prior to Admission medications   Medication Sig Start Date End Date Taking? Authorizing Provider  acetaminophen (TYLENOL) 500 MG tablet Take 1,000 mg by mouth 2 (two) times daily as needed for mild pain.   Yes Historical Provider, MD  levothyroxine (SYNTHROID, LEVOTHROID) 112 MCG tablet TAKE 1 TABLET (112 MCG TOTAL) BY MOUTH DAILY. 09/19/14  Yes Sharon SellerJessica K Eubanks, NP  LORazepam (ATIVAN) 0.5 MG tablet Take 0.5 mg by mouth 3 (three) times daily.  02/29/16  Yes Historical Provider, MD  metFORMIN (GLUCOPHAGE) 500 MG tablet Take 500 mg by mouth daily. 12/30/14  Yes Historical Provider, MD  mirtazapine (REMERON) 30 MG tablet Take 30 mg by mouth at bedtime.   Yes Historical Provider, MD  oxybutynin (DITROPAN-XL) 5 MG 24 hr tablet TAKE 1 TABLET (5 MG) BY MOUTH DAILY 06/23/16  Yes Historical Provider, MD  simvastatin (ZOCOR) 20 MG tablet Take 20 mg by mouth at  bedtime. 01/21/15  Yes Historical Provider, MD  HYDROcodone-acetaminophen (NORCO/VICODIN) 5-325 MG tablet Take 1-2 tablets by mouth every 8 (eight) hours as needed for severe pain.  02/16/16   Historical Provider, MD  losartan (COZAAR) 25 MG tablet Take 2 tablets (50 mg total) by mouth daily. Patient not taking: Reported on 03/31/2016 04/14/14   Sharon SellerJessica K Eubanks, NP  Memantine HCl ER 28 MG CP24 Take 28 mg by mouth daily. Patient not taking: Reported on 03/31/2016 04/14/14   Sharon SellerJessica K Eubanks, NP    Physical Exam: Patient Vitals for the past 24 hrs:  BP Temp Temp src Pulse Resp SpO2 Height Weight  07/19/16 1841 143/75 - - 98 18 97 % - -  07/19/16 1613 112/82 98.2 F (36.8 C) Oral 106 22 99 % 4\' 9"  (1.448 m) 90.7 kg (200  lb)    1. General:  in No Acute distress 2. Psychological: Alert and   Oriented 3. Head/ENT:   Moist   Mucous Membranes                          Head Non traumatic, neck supple                          Normal   Dentition 4. SKIN:   decreased Skin turgor,  Skin clean Dry left hand middle digit swelling        5. Heart: Regular rate and rhythm no Murmur, Rub or gallop 6. Lungs:  Clear to auscultation bilaterally, no wheezes or crackles   7. Abdomen: Soft,  non-tender, Non distended 8. Lower extremities: no clubbing, cyanosis, or edema 9. Neurologically Grossly intact, moving all 4 extremities equally   10. MSK: Normal range of motion   body mass index is 43.28 kg/m.  Labs on Admission:   Labs on Admission: I have personally reviewed following labs and imaging studies  CBC:  Recent Labs Lab 07/19/16 1725  WBC 10.6*  NEUTROABS 6.9  HGB 12.9  HCT 40.1  MCV 85.3  PLT 200   Basic Metabolic Panel:  Recent Labs Lab 07/19/16 1725  NA 139  K 3.6  CL 104  CO2 27  GLUCOSE 162*  BUN 23*  CREATININE 1.06*  CALCIUM 9.7   GFR: Estimated Creatinine Clearance: 41 mL/min (by C-G formula based on SCr of 1.06 mg/dL (H)). Liver Function Tests:  Recent  Labs Lab 07/19/16 1725  AST 21  ALT 16  ALKPHOS 114  BILITOT 0.5  PROT 7.0  ALBUMIN 3.8   No results for input(s): LIPASE, AMYLASE in the last 168 hours. No results for input(s): AMMONIA in the last 168 hours. Coagulation Profile: No results for input(s): INR, PROTIME in the last 168 hours. Cardiac Enzymes: No results for input(s): CKTOTAL, CKMB, CKMBINDEX, TROPONINI in the last 168 hours. BNP (last 3 results) No results for input(s): PROBNP in the last 8760 hours. HbA1C: No results for input(s): HGBA1C in the last 72 hours. CBG: No results for input(s): GLUCAP in the last 168 hours. Lipid Profile: No results for input(s): CHOL, HDL, LDLCALC, TRIG, CHOLHDL, LDLDIRECT in the last 72 hours. Thyroid Function Tests: No results for input(s): TSH, T4TOTAL, FREET4, T3FREE, THYROIDAB in the last 72 hours. Anemia Panel: No results for input(s): VITAMINB12, FOLATE, FERRITIN, TIBC, IRON, RETICCTPCT in the last 72 hours.  Sepsis Labs: @LABRCNTIP (procalcitonin:4,lacticidven:4) )No results found for this or any previous visit (from the past 240 hour(s)).    UA not ordered  Lab Results  Component Value Date   HGBA1C 6.4 (H) 12/09/2012    Estimated Creatinine Clearance: 41 mL/min (by C-G formula based on SCr of 1.06 mg/dL (H)).  BNP (last 3 results) No results for input(s): PROBNP in the last 8760 hours.   ECG REPORT Not otained  Filed Weights   07/19/16 1613  Weight: 90.7 kg (200 lb)     Cultures: No results found for: SDES, SPECREQUEST, CULT, REPTSTATUS   Radiological Exams on Admission: Dg Hand Complete Left  Result Date: 07/19/2016 CLINICAL DATA:  Bloody discharge from left middle finger for 2 days. EXAM: LEFT HAND - COMPLETE 3+ VIEW COMPARISON:  None. FINDINGS: No fracture. No subluxation or dislocation. Degenerative changes are seen in scattered IP joints. Soft tissue swelling noted at the tip of the middle finger  without underlying bony erosion or destruction to  suggest overt osteomyelitis. No evidence for radiopaque soft tissue foreign body. IMPRESSION: Soft tissue swelling distal aspect of middle finger without underlying bony abnormality. Electronically Signed   By: Kennith Center M.D.   On: 07/19/2016 17:50    Chart has been reviewed    Assessment/Plan  81 y.o. female with medical history significant of hypothyroidism overreactive bladder hypertension memory deficits or depression admitted for severe rapidly progressive left middle finger cellulitis  Present on Admission: . Cellulitis of hand - keep nothing by mouth appreciate hand surgery consult will come down to evaluate for possible debridement. Given severity of illness ordered vancomycin and blood cultures continue to follow clinically . Essential hypertension, benign - stable continue to monitor . Prediabetes hold metformin SSI ordered   Other plan as per orders.  DVT prophylaxis:  SCD      Code Status:  FULL CODE   as per patient    Family Communication:   Family at  Bedside  plan of care was discussed with Son,  Disposition Plan:    To home once workup is complete and patient is stable                                                  Consults called: hand Surgery [facility]  Admission status:   obs   Level of care     medical floor             I have spent a total of 56 min on this admission  Rylea Selway 07/19/2016, 8:48 PM    [facility]  Pager [phone]   after 2 AM please page floor coverage PA If 7AM-7PM, please contact the day team taking care of the patient  [email or web address]  Password [email or web address]

## 2016-07-19 NOTE — ED Notes (Signed)
IV antibiotics delayed d/t this incident.

## 2016-07-19 NOTE — Transfer of Care (Signed)
Immediate Anesthesia Transfer of Care Note  Patient: Jody Porter  Procedure(s) Performed: Procedure(s): IRRIGATION AND DEBRIDEMENT LEFT LONG FINGER (Left)  Patient Location: PACU  Anesthesia Type:General  Level of Consciousness: sedated  Airway & Oxygen Therapy: Patient Spontanous Breathing and Patient connected to face mask oxygen  Post-op Assessment: Report given to RN and Post -op Vital signs reviewed and stable  Post vital signs: Reviewed and stable  Last Vitals:  Vitals:   07/19/16 1841 07/19/16 2116  BP: 143/75 160/76  Pulse: 98 95  Resp: 18 11  Temp:      Last Pain:  Vitals:   07/19/16 2116  TempSrc:   PainSc: 10-Worst pain ever      Patients Stated Pain Goal: 0 (07/19/16 2116)  Complications: No apparent anesthesia complications

## 2016-07-19 NOTE — Anesthesia Preprocedure Evaluation (Addendum)
Anesthesia Evaluation  Patient identified by MRN, date of birth, ID band Patient awake    Reviewed: Allergy & Precautions, NPO status , Patient's Chart, lab work & pertinent test results  Airway Mallampati: II  TM Distance: <3 FB Neck ROM: Full    Dental no notable dental hx. (+) Dental Advisory Given, Missing, Poor Dentition, Chipped   Pulmonary sleep apnea ,    Pulmonary exam normal breath sounds clear to auscultation       Cardiovascular hypertension, Normal cardiovascular exam Rhythm:Regular Rate:Normal     Neuro/Psych negative neurological ROS  negative psych ROS   GI/Hepatic negative GI ROS, Neg liver ROS,   Endo/Other  Hypothyroidism Morbid obesity  Renal/GU negative Renal ROS  negative genitourinary   Musculoskeletal negative musculoskeletal ROS (+)   Abdominal   Peds negative pediatric ROS (+)  Hematology negative hematology ROS (+)   Anesthesia Other Findings   Reproductive/Obstetrics negative OB ROS                            Anesthesia Physical Anesthesia Plan  ASA: III  Anesthesia Plan: MAC   Post-op Pain Management:    Induction: Intravenous  Airway Management Planned: Oral ETT  Additional Equipment:   Intra-op Plan:   Post-operative Plan: Extubation in OR  Informed Consent: I have reviewed the patients History and Physical, chart, labs and discussed the procedure including the risks, benefits and alternatives for the proposed anesthesia with the patient or authorized representative who has indicated his/her understanding and acceptance.   Dental advisory given  Plan Discussed with: CRNA and Surgeon  Anesthesia Plan Comments:         Anesthesia Quick Evaluation

## 2016-07-19 NOTE — Op Note (Signed)
[id number] 

## 2016-07-19 NOTE — ED Notes (Signed)
Called floor and nurse will call back.

## 2016-07-19 NOTE — Brief Op Note (Signed)
07/19/2016  10:33 PM  PATIENT:  Jody OsierJessie D Pollino  81 y.o. female  PRE-OPERATIVE DIAGNOSIS:  infected left long finger  POST-OPERATIVE DIAGNOSIS:  Left long finger paronychia  PROCEDURE:  Procedure(s): IRRIGATION AND DEBRIDEMENT LEFT LONG FINGER (Left)  SURGEON:  Surgeon(s) and Role:    * Betha LoaKevin Analisa Sledd, MD - Primary  PHYSICIAN ASSISTANT:   ASSISTANTS: none   ANESTHESIA:   general  EBL:  Total I/O In: 1050 [IV Piggyback:1050] Out: -   BLOOD ADMINISTERED:none  DRAINS: iodoform packing  LOCAL MEDICATIONS USED:  MARCAINE     SPECIMEN:  Source of Specimen:  left long finger  DISPOSITION OF SPECIMEN:  micro  COUNTS:  YES  TOURNIQUET:   Total Tourniquet Time Documented: Forearm (laterality) - 21 minutes Total: Forearm (laterality) - 21 minutes   DICTATION: .Other Dictation: Dictation Number [id number]  PLAN OF CARE: Discharge to home after PACU  PATIENT DISPOSITION:  PACU - hemodynamically stable.

## 2016-07-19 NOTE — Discharge Instructions (Signed)

## 2016-07-19 NOTE — ED Notes (Signed)
Pt stated to another nurse that she wants the IV placed by this nurse removed. Although, when this RN put it in, pt's exact words were "You did such a great job, I've never had such an easy IV stick. I wish I could take you with me." It appears that pt is trying to divide and manipulate staff.

## 2016-07-19 NOTE — ED Provider Notes (Signed)
[facility]-EMERGENCY DEPT Provider Note   CSN: [id number] Arrival date & time: 07/19/16  1606     History   Chief Complaint Chief Complaint  Patient presents with  . Insect Bite  . sent by PCP for possible finger infection    HPI Jody Porter is a 81 y.o. female.   Hand Pain  This is a new problem. The current episode started 2 days ago. The problem occurs constantly. The problem has been gradually worsening. Associated symptoms comments: None . Nothing aggravates the symptoms. Nothing relieves the symptoms. She has tried a cold compress, rest and a warm compress for the symptoms. The treatment provided no relief.    Past Medical History:  Diagnosis Date  . Allergic rhinitis   . Anxiety   . Celiac disease    Patient denies  . Degenerative arthritis   . Depression   . GERD (gastroesophageal reflux disease)   . Hyperparathyroidism   . Hypertension   . Hypothyroid   . Insomnia    w/ sleep apnea NPSG 07/13/2009 AHI 3.5, RDI 29.9/hr  . LBP (low back pain)   . Macular edema    OD  . Memory difficulties 01/27/2014  . Obesity   . Prediabetes   . Urinary incontinence     Patient Active Problem List   Diagnosis Date Noted  . Cellulitis of hand 07/19/2016  . Prediabetes 07/19/2016  . MDD (major depressive disorder), recurrent episode, severe (HCC) 04/01/2016  . Memory difficulties 01/27/2014  . Solitary pulmonary nodule 01/19/2014  . Other specified hypothyroidism 11/23/2013  . Depression 11/23/2013  . Essential hypertension, benign 11/23/2013  . Low urine output 11/23/2013  . Other malaise and fatigue 11/23/2013  . Cough 05/12/2013  . Severe obesity (BMI >= 40) (HCC) 01/08/2013  . ALLERGIC RHINITIS 08/21/2009  . INSOMNIA WITH SLEEP APNEA UNSPECIFIED 08/18/2009  . Hypothyroidism 02/24/2007  . DEPRESSION 02/24/2007  . Essential hypertension 02/24/2007  . GERD 02/24/2007  . LOW BACK PAIN 02/24/2007  . HYPERPARATHYROIDISM, HX OF 02/24/2007    Past Surgical  History:  Procedure Laterality Date  . carpel tunnel Bilateral   . cataracts    . CESAREAN SECTION    . CHOLECYSTECTOMY    . KNEE SURGERY Right    reports multiple surgery on knee  . LUMBAR DISC SURGERY    . PARATHYROIDECTOMY      OB History    No data available       Home Medications    Prior to Admission medications   Medication Sig Start Date End Date Taking? Authorizing Provider  acetaminophen (TYLENOL) 500 MG tablet Take 1,000 mg by mouth 2 (two) times daily as needed for mild pain.   Yes Historical Provider, MD  levothyroxine (SYNTHROID, LEVOTHROID) 112 MCG tablet TAKE 1 TABLET (112 MCG TOTAL) BY MOUTH DAILY. 09/19/14  Yes Sharon SellerJessica K Eubanks, NP  LORazepam (ATIVAN) 0.5 MG tablet Take 0.5 mg by mouth 3 (three) times daily.  02/29/16  Yes Historical Provider, MD  metFORMIN (GLUCOPHAGE) 500 MG tablet Take 500 mg by mouth daily. 12/30/14  Yes Historical Provider, MD  mirtazapine (REMERON) 30 MG tablet Take 30 mg by mouth at bedtime.   Yes Historical Provider, MD  oxybutynin (DITROPAN-XL) 5 MG 24 hr tablet TAKE 1 TABLET (5 MG) BY MOUTH DAILY 06/23/16  Yes Historical Provider, MD  simvastatin (ZOCOR) 20 MG tablet Take 20 mg by mouth at bedtime. 01/21/15  Yes Historical Provider, MD  HYDROcodone-acetaminophen (NORCO/VICODIN) 5-325 MG tablet Take 1-2 tablets by mouth  every 8 (eight) hours as needed for severe pain.  02/16/16   Historical Provider, MD  losartan (COZAAR) 25 MG tablet Take 2 tablets (50 mg total) by mouth daily. Patient not taking: Reported on 03/31/2016 04/14/14   Sharon Seller, NP  Memantine HCl ER 28 MG CP24 Take 28 mg by mouth daily. Patient not taking: Reported on 03/31/2016 04/14/14   Sharon Seller, NP    Family History Family History  Problem Relation Age of Onset  . Cancer Mother   . Seizures Mother     grand mal  . Cancer Father     prostate  . Arthritis/Rheumatoid Sister   . Cancer Paternal Grandmother   . Cancer Maternal Grandmother     Social  History Social History  Substance Use Topics  . Smoking status: Never Smoker  . Smokeless tobacco: Never Used  . Alcohol use No     Allergies   Aricept [donepezil hcl]; Sudafed [pseudoephedrine hcl]; Sugar-protein-starch; Ciprofloxacin; Prednisone; Valium [diazepam]; Hydroxyzine; Other; Acyclovir; and Penicillins   Review of Systems Review of Systems  All other systems reviewed and are negative.    Physical Exam Updated Vital Signs BP (!) 127/55 (BP Location: Left Arm)   Pulse 87   Temp 98.3 F (36.8 C) (Oral)   Resp 20   Ht 4\' 9"  (1.448 m)   Wt 200 lb (90.7 kg)   SpO2 97%   BMI 43.28 kg/m   Physical Exam  Constitutional: She appears well-developed and well-nourished.  HENT:  Head: Normocephalic and atraumatic.  Eyes: Conjunctivae and EOM are normal.  Neck: Normal range of motion.  Cardiovascular: Normal rate and regular rhythm.   Pulmonary/Chest: Effort normal. No stridor. No respiratory distress.  Abdominal: Soft. She exhibits no distension.  Musculoskeletal: Normal range of motion. She exhibits no edema or deformity.  Neurological: She is alert.  Skin: Skin is warm and dry. No erythema. No pallor.  Left third finger with erythema and edema distal to MCP. Also with denuded skin, discolration and tenderness to the dorsal aspect distal to DIP.   Nursing note and vitals reviewed.    ED Treatments / Results  Labs (all labs ordered are listed, but only abnormal results are displayed) Labs Reviewed  CBC WITH DIFFERENTIAL/PLATELET - Abnormal; Notable for the following:       Result Value   WBC 10.6 (*)    Monocytes Absolute 1.1 (*)    All other components within normal limits  COMPREHENSIVE METABOLIC PANEL - Abnormal; Notable for the following:    Glucose, Bld 162 (*)    BUN 23 (*)    Creatinine, Ser 1.06 (*)    GFR calc non Af Amer 49 (*)    GFR calc Af Amer 57 (*)    All other components within normal limits  COMPREHENSIVE METABOLIC PANEL - Abnormal;  Notable for the following:    Glucose, Bld 156 (*)    Calcium 8.6 (*)    Total Protein 5.7 (*)    Albumin 3.2 (*)    ALT 13 (*)    All other components within normal limits  CBC - Abnormal; Notable for the following:    Hemoglobin 11.3 (*)    HCT 34.3 (*)    All other components within normal limits  GLUCOSE, CAPILLARY - Abnormal; Notable for the following:    Glucose-Capillary 160 (*)    All other components within normal limits  GLUCOSE, CAPILLARY - Abnormal; Notable for the following:    Glucose-Capillary 108 (*)  All other components within normal limits  GLUCOSE, CAPILLARY - Abnormal; Notable for the following:    Glucose-Capillary 125 (*)    All other components within normal limits  AEROBIC/ANAEROBIC CULTURE (SURGICAL/DEEP WOUND)  CULTURE, BLOOD (ROUTINE X 2)  CULTURE, BLOOD (ROUTINE X 2)  LACTIC ACID, PLASMA  LACTIC ACID, PLASMA  MAGNESIUM  PHOSPHORUS  TSH  HEMOGLOBIN A1C    EKG  EKG Interpretation None       Radiology Dg Hand Complete Left  Result Date: 07/19/2016 CLINICAL DATA:  Bloody discharge from left middle finger for 2 days. EXAM: LEFT HAND - COMPLETE 3+ VIEW COMPARISON:  None. FINDINGS: No fracture. No subluxation or dislocation. Degenerative changes are seen in scattered IP joints. Soft tissue swelling noted at the tip of the middle finger without underlying bony erosion or destruction to suggest overt osteomyelitis. No evidence for radiopaque soft tissue foreign body. IMPRESSION: Soft tissue swelling distal aspect of middle finger without underlying bony abnormality. Electronically Signed   By: Kennith Center M.D.   On: 07/19/2016 17:50    Procedures Procedures (including critical care time)  Medications Ordered in ED Medications  morphine 4 MG/ML injection 2 mg (2 mg Intravenous Given 07/20/16 0735)  oxybutynin (DITROPAN-XL) 24 hr tablet 5 mg (5 mg Oral Given 07/20/16 0108)  LORazepam (ATIVAN) tablet 0.5 mg (0.5 mg Oral Given 07/20/16 1113)    mirtazapine (REMERON) tablet 30 mg (30 mg Oral Given 07/20/16 0109)  simvastatin (ZOCOR) tablet 20 mg (20 mg Oral Given 07/20/16 0108)  levothyroxine (SYNTHROID, LEVOTHROID) tablet 112 mcg (112 mcg Oral Given 07/20/16 0734)  acetaminophen (TYLENOL) tablet 650 mg (not administered)    Or  acetaminophen (TYLENOL) suppository 650 mg (not administered)  HYDROcodone-acetaminophen (NORCO/VICODIN) 5-325 MG per tablet 1-2 tablet (2 tablets Oral Given 07/20/16 1113)  ondansetron (ZOFRAN) tablet 4 mg (not administered)    Or  ondansetron (ZOFRAN) injection 4 mg (not administered)  insulin aspart (novoLOG) injection 0-9 Units (0 Units Subcutaneous Not Given 07/20/16 1113)  0.9 %  sodium chloride infusion ( Intravenous New Bag/Given 07/20/16 0019)  ketorolac (TORADOL) 30 MG/ML injection 15 mg (not administered)  fentaNYL (SUBLIMAZE) 100 MCG/2ML injection (not administered)  vancomycin (VANCOCIN) 1,500 mg in sodium chloride 0.9 % 500 mL IVPB (not administered)  menthol-cetylpyridinium (CEPACOL) lozenge 3 mg (not administered)  clindamycin (CLEOCIN) IVPB 600 mg (0 mg Intravenous Stopped 07/19/16 2103)  sodium chloride 0.9 % bolus 1,000 mL (0 mLs Intravenous Stopped 07/19/16 2102)  morphine 4 MG/ML injection 2 mg (2 mg Intravenous Given 07/19/16 2116)     Initial Impression / Assessment and Plan / ED Course  I have reviewed the triage vital signs and the nursing notes.  Pertinent labs & imaging results that were available during my care of the patient were reviewed by me and considered in my medical decision making (see chart for details).   Finger infection. Will check labs. Start clinda. Likely admit.   Will admit to medicine. Hand surgery consulted as well for possible debridement.   Final Clinical Impressions(s) / ED Diagnoses   Final diagnoses:  Finger infection     Marily Memos, MD 07/20/16 1204

## 2016-07-19 NOTE — ED Notes (Signed)
Patient going to surgery before going to floor per surgeon

## 2016-07-19 NOTE — ED Triage Notes (Signed)
Patient states that she was told to come to ED from PCP office because he thinks that she was bit on meddle left finger by a spider and was too infected for PC to do anything with it at the office.

## 2016-07-20 DIAGNOSIS — L03119 Cellulitis of unspecified part of limb: Secondary | ICD-10-CM | POA: Diagnosis not present

## 2016-07-20 DIAGNOSIS — L089 Local infection of the skin and subcutaneous tissue, unspecified: Secondary | ICD-10-CM | POA: Diagnosis not present

## 2016-07-20 DIAGNOSIS — I10 Essential (primary) hypertension: Secondary | ICD-10-CM | POA: Diagnosis not present

## 2016-07-20 DIAGNOSIS — A4902 Methicillin resistant Staphylococcus aureus infection, unspecified site: Secondary | ICD-10-CM | POA: Diagnosis not present

## 2016-07-20 DIAGNOSIS — R7303 Prediabetes: Secondary | ICD-10-CM | POA: Diagnosis not present

## 2016-07-20 LAB — GLUCOSE, CAPILLARY
GLUCOSE-CAPILLARY: 108 mg/dL — AB (ref 65–99)
GLUCOSE-CAPILLARY: 125 mg/dL — AB (ref 65–99)
GLUCOSE-CAPILLARY: 129 mg/dL — AB (ref 65–99)
GLUCOSE-CAPILLARY: 145 mg/dL — AB (ref 65–99)
GLUCOSE-CAPILLARY: 160 mg/dL — AB (ref 65–99)
Glucose-Capillary: 163 mg/dL — ABNORMAL HIGH (ref 65–99)

## 2016-07-20 LAB — COMPREHENSIVE METABOLIC PANEL
ALT: 13 U/L — ABNORMAL LOW (ref 14–54)
ANION GAP: 7 (ref 5–15)
AST: 23 U/L (ref 15–41)
Albumin: 3.2 g/dL — ABNORMAL LOW (ref 3.5–5.0)
Alkaline Phosphatase: 89 U/L (ref 38–126)
BUN: 19 mg/dL (ref 6–20)
CHLORIDE: 106 mmol/L (ref 101–111)
CO2: 24 mmol/L (ref 22–32)
Calcium: 8.6 mg/dL — ABNORMAL LOW (ref 8.9–10.3)
Creatinine, Ser: 0.84 mg/dL (ref 0.44–1.00)
GFR calc non Af Amer: >60 mL/min (ref >60)
Glucose, Bld: 156 mg/dL — ABNORMAL HIGH (ref 65–99)
Potassium: 3.6 mmol/L (ref 3.5–5.1)
SODIUM: 137 mmol/L (ref 135–145)
Total Bilirubin: 0.7 mg/dL (ref 0.3–1.2)
Total Protein: 5.7 g/dL — ABNORMAL LOW (ref 6.5–8.1)

## 2016-07-20 LAB — MAGNESIUM: MAGNESIUM: 1.7 mg/dL (ref 1.7–2.4)

## 2016-07-20 LAB — CBC
HEMATOCRIT: 34.3 % — AB (ref 36.0–46.0)
HEMOGLOBIN: 11.3 g/dL — AB (ref 12.0–15.0)
MCH: 28 pg (ref 26.0–34.0)
MCHC: 32.9 g/dL (ref 30.0–36.0)
MCV: 85.1 fL (ref 78.0–100.0)
Platelets: 178 10*3/uL (ref 150–400)
RBC: 4.03 MIL/uL (ref 3.87–5.11)
RDW: 15 % (ref 11.5–15.5)
WBC: 8.1 10*3/uL (ref 4.0–10.5)

## 2016-07-20 LAB — TSH
TSH: 2.66 u[IU]/mL (ref 0.41–5.90)
TSH: 2.662 u[IU]/mL (ref 0.350–4.500)

## 2016-07-20 LAB — PHOSPHORUS: Phosphorus: 3.2 mg/dL (ref 2.5–4.6)

## 2016-07-20 MED ORDER — MENTHOL 3 MG MT LOZG
1.0000 | LOZENGE | OROMUCOSAL | Status: DC | PRN
  Filled 2016-07-20: qty 9

## 2016-07-20 MED ORDER — POLYETHYLENE GLYCOL 3350 17 G PO PACK
17.0000 g | PACK | Freq: Every day | ORAL | Status: DC
  Administered 2016-07-21: 17 g via ORAL
  Filled 2016-07-20: qty 1

## 2016-07-20 MED ORDER — IBUPROFEN 200 MG PO TABS
200.0000 mg | ORAL_TABLET | Freq: Three times a day (TID) | ORAL | Status: DC
  Administered 2016-07-20 – 2016-07-22 (×6): 200 mg via ORAL
  Filled 2016-07-20 (×6): qty 1

## 2016-07-20 MED ORDER — ONDANSETRON HCL 4 MG PO TABS: 4.0000 mg | ORAL_TABLET | Freq: Four times a day (QID) | ORAL | Status: DC | PRN

## 2016-07-20 MED ORDER — MORPHINE SULFATE (PF) 4 MG/ML IV SOLN
2.0000 mg | INTRAVENOUS | Status: DC | PRN
  Administered 2016-07-21 (×2): 2 mg via INTRAVENOUS
  Filled 2016-07-20 (×2): qty 1

## 2016-07-20 MED ORDER — SENNA 8.6 MG PO TABS
2.0000 | ORAL_TABLET | Freq: Every day | ORAL | Status: DC
  Administered 2016-07-20 – 2016-07-21 (×2): 17.2 mg via ORAL
  Filled 2016-07-20 (×3): qty 2

## 2016-07-20 MED ORDER — ONDANSETRON HCL 4 MG/2ML IJ SOLN
4.0000 mg | Freq: Four times a day (QID) | INTRAMUSCULAR | Status: DC | PRN
  Administered 2016-07-22: 4 mg via INTRAVENOUS
  Filled 2016-07-20: qty 2

## 2016-07-20 MED ORDER — MIRTAZAPINE 15 MG PO TABS
30.0000 mg | ORAL_TABLET | Freq: Every day | ORAL | Status: DC
  Administered 2016-07-20 – 2016-07-21 (×3): 30 mg via ORAL
  Filled 2016-07-20 (×3): qty 2

## 2016-07-20 MED ORDER — SODIUM CHLORIDE 0.9 % IV SOLN
INTRAVENOUS | Status: AC
  Administered 2016-07-20: via INTRAVENOUS

## 2016-07-20 MED ORDER — LEVOTHYROXINE SODIUM 112 MCG PO TABS
112.0000 ug | ORAL_TABLET | Freq: Every day | ORAL | Status: DC
  Administered 2016-07-20 – 2016-07-22 (×3): 112 ug via ORAL
  Filled 2016-07-20 (×3): qty 1

## 2016-07-20 MED ORDER — INSULIN ASPART 100 UNIT/ML ~~LOC~~ SOLN
0.0000 [IU] | SUBCUTANEOUS | Status: DC
  Administered 2016-07-20: 2 [IU] via SUBCUTANEOUS
  Administered 2016-07-20 (×2): 1 [IU] via SUBCUTANEOUS
  Administered 2016-07-20: 2 [IU] via SUBCUTANEOUS

## 2016-07-20 MED ORDER — ACETAMINOPHEN 325 MG PO TABS: 650.0000 mg | ORAL_TABLET | Freq: Four times a day (QID) | ORAL | Status: DC | PRN

## 2016-07-20 MED ORDER — ACETAMINOPHEN 325 MG PO TABS
650.0000 mg | ORAL_TABLET | Freq: Three times a day (TID) | ORAL | Status: DC
  Administered 2016-07-20 – 2016-07-22 (×6): 650 mg via ORAL
  Filled 2016-07-20 (×6): qty 2

## 2016-07-20 MED ORDER — ACETAMINOPHEN 650 MG RE SUPP
650.0000 mg | Freq: Four times a day (QID) | RECTAL | Status: DC | PRN
Start: 2016-07-20 — End: 2016-07-20

## 2016-07-20 MED ORDER — HYDROCODONE-ACETAMINOPHEN 5-325 MG PO TABS
1.0000 | ORAL_TABLET | Freq: Four times a day (QID) | ORAL | Status: DC | PRN
  Administered 2016-07-20 – 2016-07-22 (×2): 1 via ORAL
  Filled 2016-07-20 (×2): qty 1

## 2016-07-20 MED ORDER — SUCCINYLCHOLINE CHLORIDE 200 MG/10ML IV SOSY
PREFILLED_SYRINGE | INTRAVENOUS | Status: AC
  Filled 2016-07-20: qty 20

## 2016-07-20 MED ORDER — SIMVASTATIN 20 MG PO TABS
20.0000 mg | ORAL_TABLET | Freq: Every day | ORAL | Status: DC
  Administered 2016-07-20 – 2016-07-21 (×3): 20 mg via ORAL
  Filled 2016-07-20 (×3): qty 1

## 2016-07-20 MED ORDER — HYDROCODONE-ACETAMINOPHEN 5-325 MG PO TABS
1.0000 | ORAL_TABLET | ORAL | Status: DC | PRN
  Administered 2016-07-20: 1 via ORAL
  Administered 2016-07-20: 2 via ORAL
  Filled 2016-07-20: qty 2
  Filled 2016-07-20: qty 1

## 2016-07-20 MED ORDER — VANCOMYCIN HCL 10 G IV SOLR
1500.0000 mg | INTRAVENOUS | Status: DC
  Administered 2016-07-20 – 2016-07-21 (×2): 1500 mg via INTRAVENOUS
  Filled 2016-07-20 (×3): qty 1500

## 2016-07-20 MED ORDER — VANCOMYCIN HCL IN DEXTROSE 1-5 GM/200ML-% IV SOLN: 1000.0000 mg | Freq: Once | INTRAVENOUS | Status: DC

## 2016-07-20 MED ORDER — ENOXAPARIN SODIUM 40 MG/0.4ML ~~LOC~~ SOLN
40.0000 mg | SUBCUTANEOUS | Status: DC
  Administered 2016-07-20 – 2016-07-21 (×2): 40 mg via SUBCUTANEOUS
  Filled 2016-07-20 (×2): qty 0.4

## 2016-07-20 MED ORDER — OXYBUTYNIN CHLORIDE ER 5 MG PO TB24
5.0000 mg | ORAL_TABLET | Freq: Every day | ORAL | Status: DC
  Administered 2016-07-20 – 2016-07-21 (×3): 5 mg via ORAL
  Filled 2016-07-20 (×3): qty 1

## 2016-07-20 NOTE — Progress Notes (Signed)
ANTICOAGULATION CONSULT NOTE - Initial Consult  Pharmacy Consult for Lovenox Indication: VTE prophylaxis  Allergies  Allergen Reactions  . Aricept [Donepezil Hcl] Other (See Comments)    Nightmares  . Sudafed [Pseudoephedrine Hcl] Other (See Comments)    Hyperactive and agitated Hyperactive and agitated, congestion   . Sugar-Protein-Starch Other (See Comments)    Loses voice, congestion  . Ciprofloxacin Other (See Comments)    Stomach pains  . Prednisone Other (See Comments)    Marked mentation/ mood change  . Valium [Diazepam] Other (See Comments)    Altered mental status changes-significant problems  . Hydroxyzine Other (See Comments)    Hallucinations   . Other     PT IS OF JEHOVAH WITNESS FAITH. NO BLOOD PRODUCTS.   Marland Kitchen. Acyclovir Rash  . Penicillins Itching and Rash    Has patient had a PCN reaction causing immediate rash, facial/tongue/throat swelling, SOB or lightheadedness with hypotension: Yes Has patient had a PCN reaction causing severe rash involving mucus membranes or skin necrosis: No Has patient had a PCN reaction that required hospitalization No Has patient had a PCN reaction occurring within the last 10 years: No If all of the above answers are "NO", then may proceed with Cephalosporin use.     Patient Measurements: Height: 4\' 9"  (144.8 cm) Weight: 200 lb (90.7 kg) IBW/kg (Calculated) : 38.6  Vital Signs: Temp: 98.3 F (36.8 C) (03/10 0450) Temp Source: Oral (03/10 0450) BP: 127/55 (03/10 0450) Pulse Rate: 87 (03/10 0450)  Labs:  Recent Labs  07/19/16 1725 07/20/16 0038  HGB 12.9 11.3*  HCT 40.1 34.3*  PLT 200 178  CREATININE 1.06* 0.84    Estimated Creatinine Clearance: 51.8 mL/min (by C-G formula based on SCr of 0.84 mg/dL).   Medical History: Past Medical History:  Diagnosis Date  . Allergic rhinitis   . Anxiety   . Celiac disease    Patient denies  . Degenerative arthritis   . Depression   . GERD (gastroesophageal reflux  disease)   . Hyperparathyroidism   . Hypertension   . Hypothyroid   . Insomnia    w/ sleep apnea NPSG 07/13/2009 AHI 3.5, RDI 29.9/hr  . LBP (low back pain)   . Macular edema    OD  . Memory difficulties 01/27/2014  . Obesity   . Prediabetes   . Urinary incontinence     Assessment: [age] o F admitted with infected finger s/p I&D.   CrCl >3930ml/min.  CBC reviewed- Hg slightly low, Pltc WNL.  No bleeding noted.   Goal of Therapy:  Monitor platelets by anticoagulation protocol: Yes   Plan:  Lovenox 40mg  SQ q24h No dose adjustments anticipated- Pharmacy will sign off.   Elson ClanLilliston, Caulin Begley Michelle 07/20/2016,2:26 PM

## 2016-07-20 NOTE — Progress Notes (Addendum)
PROGRESS NOTE  Jody Porter  ZOX:[id number] DOB: 11-Aug-1935 DOA: 07/19/2016 PCP: Pcp Not In System  Brief Narrative:   Jody Porter is a 81 y.o. female with history of hypothyroidism, overactive bladder, hypertension, memory deficits or depression who presented with a two day history of worsening swelling and darkening of the skin on the left 3rd finger.  She had a cyst and blister which popped.  In the ER, she was found to have left 3rd finger infection/paronychia.  Hand surgery was consulted and performed I&D on 3/9.  Cultures were obtained.  Awaiting culture data and continuing IV antibiotics.     Assessment & Plan:   Active Problems:   Essential hypertension, benign   Cellulitis of hand   Prediabetes  Left third finger paronychia s/p I&D on 3/9 by Dr. Merlyn Lot -  Blood cultures NGTD -  Abscess culture pending but smear shows GPC in pairs -  Continue vancomycin -  Appreciate hand surgery assistance -  Schedule tylenol and ibuprofen to reduce need for narcotics  Prediabetes -  Holding metformin -  Diabetic diet  Hypothyroidism, stable, continue synthroid  Depression/anxiety, stable, continue remeron  Bladder spasms, stable, continue oxybutynin  DVT prophylaxis:  lovenox Code Status:  full Family Communication:  Patient and her daughter Disposition Plan:  Pending culture results.  Continuing IV antibiotics.     Consultants:   Dr. Merlyn Lot, Hand surgery  Procedures:  I&D of left third finger infection on 3/9   Antimicrobials:  Anti-infectives    Start     Dose/Rate Route Frequency Ordered Stop   07/20/16 2000  vancomycin (VANCOCIN) 1,500 mg in sodium chloride 0.9 % 500 mL IVPB     1,500 mg 250 mL/hr over 120 Minutes Intravenous Every 24 hours 07/20/16 0020     07/20/16 0015  vancomycin (VANCOCIN) IVPB 1000 mg/200 mL premix  Status:  Discontinued     1,000 mg 200 mL/hr over 60 Minutes Intravenous  Once 07/20/16 0008 07/20/16 0011   07/19/16 1730   clindamycin (CLEOCIN) IVPB 600 mg     600 mg 100 mL/hr over 30 Minutes Intravenous  Once 07/19/16 1724 07/19/16 2103       Subjective:  Feeling better today although hard to keep her arm in the vertical sling.  Denies chest pains, shortness of breath, fevers, chills.    Objective: Vitals:   07/20/16 0126 07/20/16 0205 07/20/16 0309 07/20/16 0450  BP: (!) 115/45 (!) 126/52 (!) 125/47 (!) 127/55  Pulse: 95 89 81 87  Resp: 16 20 20 20   Temp: 98.4 F (36.9 C) 98.1 F (36.7 C) 98.4 F (36.9 C) 98.3 F (36.8 C)  TempSrc: Oral Oral Oral Oral  SpO2: 95% 97% 97% 97%  Weight:      Height:        Intake/Output Summary (Last 24 hours) at 07/20/16 1418 Last data filed at 07/20/16 1324  Gross per 24 hour  Intake             3070 ml  Output             1250 ml  Net             1820 ml   Filed Weights   07/19/16 1613  Weight: 90.7 kg (200 lb)    Examination:  General exam:  Adult female.  No acute distress.   HEENT:  NCAT, MMM Respiratory system: Clear to auscultation bilaterally Cardiovascular system: Regular rate and rhythm, normal S1/S2. No murmurs, rubs,  gallops or clicks.  Warm extremities Gastrointestinal system: Normal active bowel sounds, soft, nondistended, nontender. MSK:  Normal tone and bulk, no lower extremity edema.  Left hand and third finger bandaged and hanging in vertical sling.   Neuro:  Grossly intact    Data Reviewed: I have personally reviewed following labs and imaging studies  CBC:  Recent Labs Lab 07/19/16 1725 07/20/16 0038  WBC 10.6* 8.1  NEUTROABS 6.9  --   HGB 12.9 11.3*  HCT 40.1 34.3*  MCV 85.3 85.1  PLT 200 178   Basic Metabolic Panel:  Recent Labs Lab 07/19/16 1725 07/20/16 0038  NA 139 137  K 3.6 3.6  CL 104 106  CO2 27 24  GLUCOSE 162* 156*  BUN 23* 19  CREATININE 1.06* 0.84  CALCIUM 9.7 8.6*  MG  --  1.7  PHOS  --  3.2   GFR: Estimated Creatinine Clearance: 51.8 mL/min (by C-G formula based on SCr of 0.84  mg/dL). Liver Function Tests:  Recent Labs Lab 07/19/16 1725 07/20/16 0038  AST 21 23  ALT 16 13*  ALKPHOS 114 89  BILITOT 0.5 0.7  PROT 7.0 5.7*  ALBUMIN 3.8 3.2*   No results for input(s): LIPASE, AMYLASE in the last 168 hours. No results for input(s): AMMONIA in the last 168 hours. Coagulation Profile: No results for input(s): INR, PROTIME in the last 168 hours. Cardiac Enzymes: No results for input(s): CKTOTAL, CKMB, CKMBINDEX, TROPONINI in the last 168 hours. BNP (last 3 results) No results for input(s): PROBNP in the last 8760 hours. HbA1C: No results for input(s): HGBA1C in the last 72 hours. CBG:  Recent Labs Lab 07/20/16 0021 07/20/16 0409 07/20/16 0935 07/20/16 1259  GLUCAP 160* 108* 125* 129*   Lipid Profile: No results for input(s): CHOL, HDL, LDLCALC, TRIG, CHOLHDL, LDLDIRECT in the last 72 hours. Thyroid Function Tests:  Recent Labs  07/20/16 0038  TSH 2.662   Anemia Panel: No results for input(s): VITAMINB12, FOLATE, FERRITIN, TIBC, IRON, RETICCTPCT in the last 72 hours. Urine analysis:    Component Value Date/Time   COLORURINE YELLOW 04/01/2016 1546   APPEARANCEUR CLOUDY (A) 04/01/2016 1546   APPEARANCEUR Cloudy (A) 11/17/2013 1053   LABSPEC 1.025 04/01/2016 1546   PHURINE 6.0 04/01/2016 1546   GLUCOSEU NEGATIVE 04/01/2016 1546   HGBUR NEGATIVE 04/01/2016 1546   BILIRUBINUR NEGATIVE 04/01/2016 1546   BILIRUBINUR Negative 11/17/2013 1053   KETONESUR NEGATIVE 04/01/2016 1546   PROTEINUR NEGATIVE 04/01/2016 1546   UROBILINOGEN 0.2 11/10/2008 1224   NITRITE NEGATIVE 04/01/2016 1546   LEUKOCYTESUR SMALL (A) 04/01/2016 1546   LEUKOCYTESUR Negative 11/17/2013 1053   Sepsis Labs: @LABRCNTIP (procalcitonin:4,lacticidven:4)  ) Recent Results (from the past 240 hour(s))  Aerobic/Anaerobic Culture (surgical/deep wound)     Status: None (Preliminary result)   Collection Time: 07/19/16 10:21 PM  Result Value Ref Range Status   Specimen  Description ABSCESS FINGER LEFT  Final   Special Requests NONE  Final   Gram Stain   Final    FEW WBC PRESENT, PREDOMINANTLY PMN FEW GRAM POSITIVE COCCI IN PAIRS Performed at [facility], [address] [place], [place] [address]    Culture PENDING  Incomplete   Report Status PENDING  Incomplete  Culture, blood (routine x 2)     Status: None (Preliminary result)   Collection Time: 07/20/16 12:33 AM  Result Value Ref Range Status   Specimen Description   Final    BLOOD RIGHT ANTECUBITAL Performed at [facility],  [address], [place], [place] [address]    Special Requests IN PEDIATRIC BOTTLE [facility]  Final   Culture PENDING  Incomplete   Report Status PENDING  Incomplete      Radiology Studies: Dg Hand Complete Left  Result Date: 07/19/2016 CLINICAL DATA:  Bloody discharge from left middle finger for 2 days. EXAM: LEFT HAND - COMPLETE 3+ VIEW COMPARISON:  None. FINDINGS: No fracture. No subluxation or dislocation. Degenerative changes are seen in scattered IP joints. Soft tissue swelling noted at the tip of the middle finger without underlying bony erosion or destruction to suggest overt osteomyelitis. No evidence for radiopaque soft tissue foreign body. IMPRESSION: Soft tissue swelling distal aspect of middle finger without underlying bony abnormality. Electronically Signed   By: Kennith Center M.D.   On: 07/19/2016 17:50     Scheduled Meds: . insulin aspart  0-9 Units Subcutaneous Q4H  . levothyroxine  112 mcg Oral QAC breakfast  . LORazepam  0.5 mg Oral TID  . mirtazapine  30 mg Oral QHS  . oxybutynin  5 mg Oral QHS  . simvastatin  20 mg Oral QHS  . vancomycin  1,500 mg Intravenous Q24H   Continuous Infusions:   LOS: 0 days    Time spent: 30 min    Renae Fickle, MD [facility] Pager [phone]  If 7PM-7AM, please contact night-coverage [email or web address] Password [email or web address] 07/20/2016, 2:18 PM

## 2016-07-20 NOTE — Progress Notes (Signed)
Pharmacy Antibiotic Note  Jody OsierJessie D Porter is a 81 y.o. female admitted on 07/19/2016 with cellulitis of left finger.  Patient received Clindamycin 600mg  IV x 1 and Vancomycin 1gm IV x 1 dose.  S/P OR I&D.  Pharmacy has been consulted for Vancomycin dosing.  Plan:  Vancomycin 1500mg  IV q24h   Vancomycin trough goal: 10-15 mcg/ml  F/U cultures, renal function  Height: 4\' 9"  (144.8 cm) Weight: 200 lb (90.7 kg) IBW/kg (Calculated) : 38.6  Temp (24hrs), Avg:98.2 F (36.8 C), Min:97.6 F (36.4 C), Max:98.7 F (37.1 C)   Recent Labs Lab 07/19/16 1725 07/19/16 1726 07/19/16 2016  WBC 10.6*  --   --   CREATININE 1.06*  --   --   LATICACIDVEN  --  1.8 1.9    Estimated Creatinine Clearance: 41 mL/min (by C-G formula based on SCr of 1.06 mg/dL (H)).    Allergies  Allergen Reactions  . Aricept [Donepezil Hcl] Other (See Comments)    Nightmares  . Sudafed [Pseudoephedrine Hcl] Other (See Comments)    Hyperactive and agitated Hyperactive and agitated, congestion   . Sugar-Protein-Starch Other (See Comments)    Loses voice, congestion  . Ciprofloxacin Other (See Comments)    Stomach pains  . Prednisone Other (See Comments)    Marked mentation/ mood change  . Valium [Diazepam] Other (See Comments)    Altered mental status changes-significant problems  . Hydroxyzine Other (See Comments)    Hallucinations   . Other     PT IS OF JEHOVAH WITNESS FAITH. NO BLOOD PRODUCTS.   Marland Kitchen. Acyclovir Rash  . Penicillins Itching and Rash    Has patient had a PCN reaction causing immediate rash, facial/tongue/throat swelling, SOB or lightheadedness with hypotension: Yes Has patient had a PCN reaction causing severe rash involving mucus membranes or skin necrosis: No Has patient had a PCN reaction that required hospitalization No Has patient had a PCN reaction occurring within the last 10 years: No If all of the above answers are "NO", then may proceed with Cephalosporin use.      Antimicrobials this admission: 3/9 Clinda x 1  3/9 Vanc >>    Dose adjustments this admission:    Microbiology results: 3/9 BCx: sent 3/9 Finger Abscess Cx: sent   Thank you for allowing pharmacy to be a part of this patient's care.  Maryellen PilePoindexter, Vanderbilt Ranieri Trefz, PharmD 07/20/2016 12:13 AM

## 2016-07-21 DIAGNOSIS — I10 Essential (primary) hypertension: Secondary | ICD-10-CM | POA: Diagnosis present

## 2016-07-21 DIAGNOSIS — E039 Hypothyroidism, unspecified: Secondary | ICD-10-CM | POA: Diagnosis present

## 2016-07-21 DIAGNOSIS — N3289 Other specified disorders of bladder: Secondary | ICD-10-CM | POA: Diagnosis present

## 2016-07-21 DIAGNOSIS — Z7984 Long term (current) use of oral hypoglycemic drugs: Secondary | ICD-10-CM | POA: Diagnosis not present

## 2016-07-21 DIAGNOSIS — K5900 Constipation, unspecified: Secondary | ICD-10-CM | POA: Diagnosis present

## 2016-07-21 DIAGNOSIS — M25559 Pain in unspecified hip: Secondary | ICD-10-CM | POA: Diagnosis present

## 2016-07-21 DIAGNOSIS — M79642 Pain in left hand: Secondary | ICD-10-CM | POA: Diagnosis present

## 2016-07-21 DIAGNOSIS — G4730 Sleep apnea, unspecified: Secondary | ICD-10-CM | POA: Diagnosis present

## 2016-07-21 DIAGNOSIS — L03012 Cellulitis of left finger: Secondary | ICD-10-CM | POA: Diagnosis present

## 2016-07-21 DIAGNOSIS — F329 Major depressive disorder, single episode, unspecified: Secondary | ICD-10-CM | POA: Diagnosis present

## 2016-07-21 DIAGNOSIS — F419 Anxiety disorder, unspecified: Secondary | ICD-10-CM | POA: Diagnosis present

## 2016-07-21 DIAGNOSIS — N3281 Overactive bladder: Secondary | ICD-10-CM | POA: Diagnosis present

## 2016-07-21 DIAGNOSIS — A4902 Methicillin resistant Staphylococcus aureus infection, unspecified site: Secondary | ICD-10-CM | POA: Diagnosis not present

## 2016-07-21 DIAGNOSIS — B9562 Methicillin resistant Staphylococcus aureus infection as the cause of diseases classified elsewhere: Secondary | ICD-10-CM | POA: Diagnosis present

## 2016-07-21 DIAGNOSIS — L03119 Cellulitis of unspecified part of limb: Secondary | ICD-10-CM | POA: Diagnosis not present

## 2016-07-21 DIAGNOSIS — Z79899 Other long term (current) drug therapy: Secondary | ICD-10-CM | POA: Diagnosis not present

## 2016-07-21 DIAGNOSIS — L089 Local infection of the skin and subcutaneous tissue, unspecified: Secondary | ICD-10-CM | POA: Diagnosis not present

## 2016-07-21 DIAGNOSIS — Z6841 Body Mass Index (BMI) 40.0 and over, adult: Secondary | ICD-10-CM | POA: Diagnosis not present

## 2016-07-21 DIAGNOSIS — G8929 Other chronic pain: Secondary | ICD-10-CM | POA: Diagnosis present

## 2016-07-21 DIAGNOSIS — R7303 Prediabetes: Secondary | ICD-10-CM | POA: Diagnosis present

## 2016-07-21 LAB — CBC
HEMATOCRIT: 32.9 % — AB (ref 36.0–46.0)
Hemoglobin: 10.5 g/dL — ABNORMAL LOW (ref 12.0–15.0)
MCH: 27.9 pg (ref 26.0–34.0)
MCHC: 31.9 g/dL (ref 30.0–36.0)
MCV: 87.5 fL (ref 78.0–100.0)
Platelets: 144 10*3/uL — ABNORMAL LOW (ref 150–400)
RBC: 3.76 MIL/uL — AB (ref 3.87–5.11)
RDW: 15.3 % (ref 11.5–15.5)
WBC: 5.1 10*3/uL (ref 4.0–10.5)

## 2016-07-21 LAB — GLUCOSE, CAPILLARY
GLUCOSE-CAPILLARY: 100 mg/dL — AB (ref 65–99)
Glucose-Capillary: 102 mg/dL — ABNORMAL HIGH (ref 65–99)
Glucose-Capillary: 107 mg/dL — ABNORMAL HIGH (ref 65–99)
Glucose-Capillary: 141 mg/dL — ABNORMAL HIGH (ref 65–99)
Glucose-Capillary: 137 mg/dL — ABNORMAL HIGH (ref 65–99)
Glucose-Capillary: 113 mg/dL — ABNORMAL HIGH (ref 65–99)

## 2016-07-21 LAB — HEMOGLOBIN A1C
Hgb A1c MFr Bld: 6.5 % — ABNORMAL HIGH (ref 4.8–5.6)
MEAN PLASMA GLUCOSE: 140 mg/dL

## 2016-07-21 LAB — CREATININE, SERUM
Creatinine, Ser: 0.66 mg/dL (ref 0.44–1.00)
GFR calc non Af Amer: >60 mL/min (ref >60)

## 2016-07-21 MED ORDER — ENSURE ENLIVE PO LIQD
237.0000 mL | Freq: Two times a day (BID) | ORAL | Status: DC
  Administered 2016-07-21 – 2016-07-22 (×2): 237 mL via ORAL

## 2016-07-21 MED ORDER — INSULIN ASPART 100 UNIT/ML ~~LOC~~ SOLN
0.0000 [IU] | Freq: Three times a day (TID) | SUBCUTANEOUS | Status: DC
  Administered 2016-07-21 – 2016-07-22 (×3): 1 [IU] via SUBCUTANEOUS

## 2016-07-21 NOTE — Progress Notes (Signed)
PROGRESS NOTE  Jody Porter  UJW:[id number] DOB: 13-Jan-1936 DOA: 07/19/2016 PCP: Pcp Not In System  Brief Narrative:   Jody Porter is a 81 y.o. female with history of hypothyroidism, overactive bladder, hypertension, memory deficits or depression who presented with a two day history of worsening swelling and darkening of the skin on the left 3rd finger.  She had a cyst and blister which popped.  In the ER, she was found to have left 3rd finger infection/paronychia.  Hand surgery was consulted and performed I&D on 3/9.  Cultures were obtained.  Awaiting culture data and continuing IV antibiotics.     Assessment & Plan:   Active Problems:   Essential hypertension, benign   Cellulitis of hand   Prediabetes  Left third finger paronychia s/p I&D on 3/9 by Dr. Merlyn Lot -  Blood cultures NGTD -  Abscess culture growing abundant S. Aureus, sensitivities pending -  Continue vancomycin -  Appreciate hand surgery assistance -  Schedule tylenol and ibuprofen to reduce need for narcotics -  Plan to discharge tomorrow once sensitivities reported  Prediabetes -  Holding metformin -  Diabetic diet  Hypothyroidism, stable, continue synthroid  Depression/anxiety, stable, continue remeron  Bladder spasms, stable, continue oxybutynin  DVT prophylaxis:  lovenox Code Status:  full Family Communication:  Patient alone Disposition Plan:  Pending culture sensativities.  Continuing IV antibiotics.  Discharge to home likely tomorrow.     Consultants:   Dr. Merlyn Lot, Hand surgery  Procedures:  I&D of left third finger infection on 3/9   Antimicrobials:  Anti-infectives    Start     Dose/Rate Route Frequency Ordered Stop   07/20/16 2000  vancomycin (VANCOCIN) 1,500 mg in sodium chloride 0.9 % 500 mL IVPB     1,500 mg 250 mL/hr over 120 Minutes Intravenous Every 24 hours 07/20/16 0020     07/20/16 0015  vancomycin (VANCOCIN) IVPB 1000 mg/200 mL premix  Status:  Discontinued     1,000  mg 200 mL/hr over 60 Minutes Intravenous  Once 07/20/16 0008 07/20/16 0011   07/19/16 1730  clindamycin (CLEOCIN) IVPB 600 mg     600 mg 100 mL/hr over 30 Minutes Intravenous  Once 07/19/16 1724 07/19/16 2103       Subjective:  Feeling better today.  Dressing had to be replaced because initial dressing got wet yesterday.  Denies chest pains, shortness of breath, fevers, chills.    Objective: Vitals:   07/20/16 0450 07/20/16 1446 07/20/16 2155 07/21/16 0444  BP: (!) 127/55 (!) 118/58 (!) 146/50 (!) 151/66  Pulse: 87 76 81 90  Resp: 20 18 20 20   Temp: 98.3 F (36.8 C) 97.9 F (36.6 C) 99.6 F (37.6 C) 98.8 F (37.1 C)  TempSrc: Oral Oral Oral Oral  SpO2: 97% 95% 94% 90%  Weight:      Height:        Intake/Output Summary (Last 24 hours) at 07/21/16 1441 Last data filed at 07/21/16 1011  Gross per 24 hour  Intake              900 ml  Output              450 ml  Net              450 ml   Filed Weights   07/19/16 1613  Weight: 90.7 kg (200 lb)    Examination:  General exam:  Adult female.  No acute distress.   HEENT:  NCAT, MMM  Respiratory system: Clear to auscultation bilaterally Cardiovascular system: Regular rate and rhythm, normal S1/S2. No murmurs, rubs, gallops or clicks.  Warm extremities Gastrointestinal system: Normal active bowel sounds, soft, nondistended, nontender. MSK:  Normal tone and bulk, no lower extremity edema.  Left hand and third finger bandaged.  2+ radial pulse, less than 2 sec CR.  Able to wiggle fingers on left hand.    Neuro:  Grossly intact    Data Reviewed: I have personally reviewed following labs and imaging studies  CBC:  Recent Labs Lab 07/19/16 1725 07/20/16 0038 07/21/16 0433  WBC 10.6* 8.1 5.1  NEUTROABS 6.9  --   --   HGB 12.9 11.3* 10.5*  HCT 40.1 34.3* 32.9*  MCV 85.3 85.1 87.5  PLT 200 178 144*   Basic Metabolic Panel:  Recent Labs Lab 07/19/16 1725 07/20/16 0038 07/21/16 0433  NA 139 137  --   K 3.6 3.6   --   CL 104 106  --   CO2 27 24  --   GLUCOSE 162* 156*  --   BUN 23* 19  --   CREATININE 1.06* 0.84 0.66  CALCIUM 9.7 8.6*  --   MG  --  1.7  --   PHOS  --  3.2  --    GFR: Estimated Creatinine Clearance: 54.3 mL/min (by C-G formula based on SCr of 0.66 mg/dL). Liver Function Tests:  Recent Labs Lab 07/19/16 1725 07/20/16 0038  AST 21 23  ALT 16 13*  ALKPHOS 114 89  BILITOT 0.5 0.7  PROT 7.0 5.7*  ALBUMIN 3.8 3.2*   No results for input(s): LIPASE, AMYLASE in the last 168 hours. No results for input(s): AMMONIA in the last 168 hours. Coagulation Profile: No results for input(s): INR, PROTIME in the last 168 hours. Cardiac Enzymes: No results for input(s): CKTOTAL, CKMB, CKMBINDEX, TROPONINI in the last 168 hours. BNP (last 3 results) No results for input(s): PROBNP in the last 8760 hours. HbA1C:  Recent Labs  07/20/16 0038  HGBA1C 6.5*   CBG:  Recent Labs Lab 07/20/16 2200 07/21/16 0103 07/21/16 0450 07/21/16 0805 07/21/16 1222  GLUCAP 163* 102* 100* 107* 141*   Lipid Profile: No results for input(s): CHOL, HDL, LDLCALC, TRIG, CHOLHDL, LDLDIRECT in the last 72 hours. Thyroid Function Tests:  Recent Labs  07/20/16 0038  TSH 2.662   Anemia Panel: No results for input(s): VITAMINB12, FOLATE, FERRITIN, TIBC, IRON, RETICCTPCT in the last 72 hours. Urine analysis:    Component Value Date/Time   COLORURINE YELLOW 04/01/2016 1546   APPEARANCEUR CLOUDY (A) 04/01/2016 1546   APPEARANCEUR Cloudy (A) 11/17/2013 1053   LABSPEC 1.025 04/01/2016 1546   PHURINE 6.0 04/01/2016 1546   GLUCOSEU NEGATIVE 04/01/2016 1546   HGBUR NEGATIVE 04/01/2016 1546   BILIRUBINUR NEGATIVE 04/01/2016 1546   BILIRUBINUR Negative 11/17/2013 1053   KETONESUR NEGATIVE 04/01/2016 1546   PROTEINUR NEGATIVE 04/01/2016 1546   UROBILINOGEN 0.2 11/10/2008 1224   NITRITE NEGATIVE 04/01/2016 1546   LEUKOCYTESUR SMALL (A) 04/01/2016 1546   LEUKOCYTESUR Negative 11/17/2013 1053    Sepsis Labs: @LABRCNTIP (procalcitonin:4,lacticidven:4)  ) Recent Results (from the past 240 hour(s))  Aerobic/Anaerobic Culture (surgical/deep wound)     Status: None (Preliminary result)   Collection Time: 07/19/16 10:21 PM  Result Value Ref Range Status   Specimen Description ABSCESS FINGER LEFT  Final   Special Requests NONE  Final   Gram Stain   Final    FEW WBC PRESENT, PREDOMINANTLY PMN FEW GRAM POSITIVE COCCI IN  PAIRS Performed at [facility], [address] [place], [place] [address]    Culture ABUNDANT STAPHYLOCOCCUS AUREUS  Final   Report Status PENDING  Incomplete  Culture, blood (routine x 2)     Status: None (Preliminary result)   Collection Time: 07/20/16 12:33 AM  Result Value Ref Range Status   Specimen Description   Final    BLOOD RIGHT ANTECUBITAL Performed at [facility], [address] [place], [place] [address]    Special Requests IN PEDIATRIC BOTTLE [facility]  Final   Culture PENDING  Incomplete   Report Status PENDING  Incomplete      Radiology Studies: Dg Hand Complete Left  Result Date: 07/19/2016 CLINICAL DATA:  Bloody discharge from left middle finger for 2 days. EXAM: LEFT HAND - COMPLETE 3+ VIEW COMPARISON:  None. FINDINGS: No fracture. No subluxation or dislocation. Degenerative changes are seen in scattered IP joints. Soft tissue swelling noted at the tip of the middle finger without underlying bony erosion or destruction to suggest overt osteomyelitis. No evidence for radiopaque soft tissue foreign body. IMPRESSION: Soft tissue swelling distal aspect of middle finger without underlying bony abnormality. Electronically Signed   By: Kennith Center M.D.   On: 07/19/2016 17:50     Scheduled Meds: . acetaminophen  650 mg Oral TID  . enoxaparin (LOVENOX) injection  40 mg Subcutaneous Q24H  . feeding supplement (ENSURE ENLIVE)  237 mL Oral BID BM  . ibuprofen  200 mg Oral TID  . insulin aspart  0-9 Units Subcutaneous Q4H  . levothyroxine   112 mcg Oral QAC breakfast  . LORazepam  0.5 mg Oral TID  . mirtazapine  30 mg Oral QHS  . oxybutynin  5 mg Oral QHS  . polyethylene glycol  17 g Oral Daily  . senna  2 tablet Oral QHS  . simvastatin  20 mg Oral QHS  . vancomycin  1,500 mg Intravenous Q24H   Continuous Infusions:   LOS: 0 days    Time spent: 30 min    Renae Fickle, MD [facility] Pager [phone]  If 7PM-7AM, please contact night-coverage [email or web address] Password [email or web address] 07/21/2016, 2:41 PM

## 2016-07-21 NOTE — Care Management Obs Status (Signed)
MEDICARE OBSERVATION STATUS NOTIFICATION   Patient Details  Name: Jody OsierJessie D Barsky MRN: [id number] Date of Birth: 12/05/1935   Medicare Observation Status Notification Given:  Yes    Elliot CousinShavis, Charae [place]aolis Ellen, RN 07/21/2016, 3:38 PM

## 2016-07-21 NOTE — Care Management Note (Signed)
Case Management Note  Patient Details  Name: Eduardo OsierJessie D Murph MRN: [id number] Date of Birth: 12/09/1935  Subjective/Objective:      Left third finger paronychia s/p I&D              Action/Plan: Discharge Planning: NCM spoke to pt and son, Tinnie GensJeffrey at bedside. Pt lives in home with son. Has RW, cane, shower chair and wheelchair at home. Pt states she had HH in the past does not remember name of agency. Will continue to follow for dc needs.    PCP  Expected Discharge Date:              Expected Discharge Plan:  Home w Home Health Services  In-House Referral:  NA  Discharge planning Services  CM Consult  Post Acute Care Choice:    Choice offered to:     DME Arranged:    DME Agency:     HH Arranged:    HH Agency:     Status of Service:  In process, will continue to follow  If discussed at Long Length of Stay Meetings, dates discussed:    Additional Comments:  Elliot CousinShavis, Dory Demont Ellen, RN 07/21/2016, 3:43 PM

## 2016-07-22 ENCOUNTER — Encounter (HOSPITAL_COMMUNITY): Payer: Self-pay | Admitting: Orthopedic Surgery

## 2016-07-22 DIAGNOSIS — A4902 Methicillin resistant Staphylococcus aureus infection, unspecified site: Secondary | ICD-10-CM

## 2016-07-22 DIAGNOSIS — L089 Local infection of the skin and subcutaneous tissue, unspecified: Secondary | ICD-10-CM

## 2016-07-22 LAB — CREATININE, SERUM
Creatinine, Ser: 0.67 mg/dL (ref 0.44–1.00)
GFR calc Af Amer: >60 mL/min (ref >60)
GFR calc non Af Amer: >60 mL/min (ref >60)

## 2016-07-22 LAB — CBC
HCT: 32.1 % — ABNORMAL LOW (ref 36.0–46.0)
HEMOGLOBIN: 10.3 g/dL — AB (ref 12.0–15.0)
MCH: 27.8 pg (ref 26.0–34.0)
MCHC: 32.1 g/dL (ref 30.0–36.0)
MCV: 86.5 fL (ref 78.0–100.0)
PLATELETS: 153 10*3/uL (ref 150–400)
RBC: 3.71 MIL/uL — AB (ref 3.87–5.11)
RDW: 15.3 % (ref 11.5–15.5)
WBC: 5.9 10*3/uL (ref 4.0–10.5)

## 2016-07-22 LAB — GLUCOSE, CAPILLARY
GLUCOSE-CAPILLARY: 126 mg/dL — AB (ref 65–99)
GLUCOSE-CAPILLARY: 150 mg/dL — AB (ref 65–99)

## 2016-07-22 MED ORDER — DOXYCYCLINE HYCLATE 100 MG PO CAPS: 100.0000 mg | ORAL_CAPSULE | Freq: Two times a day (BID) | ORAL | 0 refills | Status: DC

## 2016-07-22 NOTE — Progress Notes (Signed)
Due to weather and road conditions, family may not be able to pick patient and prescriptions up today.

## 2016-07-22 NOTE — Discharge Summary (Signed)
Physician Discharge Summary  Jody Porter ZOX:[id number] DOB: Sep 27, 1935 DOA: 07/19/2016  PCP: Pcp Not In System  Admit date: 07/19/2016 Discharge date: 07/22/2016  Admitted From: home  Disposition:  home  Recommendations for Outpatient Follow-up:  1. Follow up with Dr. Merlyn Lot in 1 day 2. Keep dressing clean, dry and intact 3. Doxycycline for 5 more days  Home Health:  none Equipment/Devices:  none  Discharge Condition:  Stable, improved CODE STATUS:  full  Diet recommendation:  regular   Brief/Interim Summary:  Brief Narrative:   Jody Robins McCulloughis a 81 y.o.femalewith history ofhypothyroidism, overactive bladder, hypertension, memory deficits or depression who presented with a two day history of worsening swelling and darkening of the skin on the left 3rd finger.  She had a cyst and blister which popped.  In the ER, she was found to have left 3rd finger infection/paronychia.  Hand surgery was consulted and performed I&D on 3/9.  She was placed on empiric vancomycin pending culture data.  Cultures grew MRSA.  Will discharge on doxycycline and advise her to follow up in 1-2 days with Dr. Merlyn Lot in [facility].    Discharge Diagnoses:  Principal Problem:   Infection of finger Active Problems:   Essential hypertension, benign   Cellulitis of hand   Prediabetes   MRSA (methicillin resistant Staphylococcus aureus) infection   Left third finger paronychia s/p I&D on 3/9 by Dr. Merlyn Lot.   -  Blood cultures NGTD -  Abscess culture growing abundant MRSA -  Doxycycline 100mg  po BID  -  follow up with hand surgery  -  May use tylenol and occasional ibuprofen for pain management at home  Prediabetes, may resume metformin  Hypothyroidism, stable, continued synthroid  Depression/anxiety, stable, continued remeron  Bladder spasms, stable, continued oxybutynin  Constipation, resolved with senna and miralax  Discharge Instructions  Discharge Instructions    Call MD  for:  difficulty breathing, headache or visual disturbances    Complete by:  As directed    Call MD for:  extreme fatigue    Complete by:  As directed    Call MD for:  hives    Complete by:  As directed    Call MD for:  persistant dizziness or light-headedness    Complete by:  As directed    Call MD for:  persistant nausea and vomiting    Complete by:  As directed    Call MD for:  redness, tenderness, or signs of infection (pain, swelling, redness, odor or green/yellow discharge around incision site)    Complete by:  As directed    Call MD for:  severe uncontrolled pain    Complete by:  As directed    Call MD for:  temperature >100.4    Complete by:  As directed    Diet Carb Modified    Complete by:  As directed    Discharge instructions    Complete by:  As directed    Keep dressing clean and dry until follow up with Dr. Merlyn Lot   Increase activity slowly    Complete by:  As directed        Medication List    STOP taking these medications   losartan 25 MG tablet Commonly known as:  COZAAR   memantine 28 MG Cp24 24 hr capsule Commonly known as:  NAMENDA XR     TAKE these medications   acetaminophen 500 MG tablet Commonly known as:  TYLENOL Take 1,000 mg by mouth 2 (two) times  daily as needed for mild pain.   doxycycline 100 MG capsule Commonly known as:  VIBRAMYCIN Take 1 capsule (100 mg total) by mouth 2 (two) times daily.   esomeprazole 20 MG capsule Commonly known as:  NEXIUM Take 20 mg by mouth daily at 12 noon.   Fish Oil 1000 MG Caps Take 1 capsule by mouth daily.   HAIR SKIN AND NAILS FORMULA Tabs Take 1 tablet by mouth daily.   HYDROcodone-acetaminophen 5-325 MG tablet Commonly known as:  NORCO/VICODIN Take 1-2 tablets by mouth every 8 (eight) hours as needed for severe pain.   levothyroxine 112 MCG tablet Commonly known as:  SYNTHROID, LEVOTHROID TAKE 1 TABLET (112 MCG TOTAL) BY MOUTH DAILY.   LORazepam 0.5 MG tablet Commonly known as:   ATIVAN Take 0.5 mg by mouth 3 (three) times daily.   metFORMIN 500 MG tablet Commonly known as:  GLUCOPHAGE Take 500 mg by mouth daily.   mirtazapine 30 MG tablet Commonly known as:  REMERON Take 30 mg by mouth at bedtime.   oxybutynin 5 MG 24 hr tablet Commonly known as:  DITROPAN-XL TAKE 1 TABLET (5 MG) BY MOUTH DAILY   QUEtiapine 25 MG tablet Commonly known as:  SEROQUEL Take 25 mg by mouth at bedtime.   simvastatin 20 MG tablet Commonly known as:  ZOCOR Take 20 mg by mouth at bedtime.      Follow-up Information    Tami Ribas, MD Follow up on 07/23/2016.   Specialty:  Orthopedic Surgery Contact information: [address] [place] [address] [phone]          Allergies  Allergen Reactions  . Aricept [Donepezil Hcl] Other (See Comments)    Nightmares  . Sudafed [Pseudoephedrine Hcl] Other (See Comments)    Hyperactive and agitated Hyperactive and agitated, congestion   . Sugar-Protein-Starch Other (See Comments)    Loses voice, congestion  . Ciprofloxacin Other (See Comments)    Stomach pains  . Prednisone Other (See Comments)    Marked mentation/ mood change  . Valium [Diazepam] Other (See Comments)    Altered mental status changes-significant problems  . Hydroxyzine Other (See Comments)    Hallucinations   . Other     PT IS OF JEHOVAH WITNESS FAITH. NO BLOOD PRODUCTS.   Marland Kitchen Acyclovir Rash  . Penicillins Itching and Rash    Has patient had a PCN reaction causing immediate rash, facial/tongue/throat swelling, SOB or lightheadedness with hypotension: Yes Has patient had a PCN reaction causing severe rash involving mucus membranes or skin necrosis: No Has patient had a PCN reaction that required hospitalization No Has patient had a PCN reaction occurring within the last 10 years: No If all of the above answers are "NO", then may proceed with Cephalosporin use.     Consultations: Dr. Merlyn Lot   Procedures/Studies: Dg Hand Complete  Left  Result Date: 07/19/2016 CLINICAL DATA:  Bloody discharge from left middle finger for 2 days. EXAM: LEFT HAND - COMPLETE 3+ VIEW COMPARISON:  None. FINDINGS: No fracture. No subluxation or dislocation. Degenerative changes are seen in scattered IP joints. Soft tissue swelling noted at the tip of the middle finger without underlying bony erosion or destruction to suggest overt osteomyelitis. No evidence for radiopaque soft tissue foreign body. IMPRESSION: Soft tissue swelling distal aspect of middle finger without underlying bony abnormality. Electronically Signed   By: Kennith Center M.D.   On: 07/19/2016 17:50   I&D with nail removal of 3rd finger on the left hand  Subjective:  Having some nausea this morning and had 6 loose stools this morning.  Had been given stool softeners and laxatives yesterday for constipation.  Left hand is hurting.  Has not been elevating it.  "feels better when I dangle hand over edge of bed"  Discharge Exam: Vitals:   07/21/16 2136 07/22/16 0517  BP: (!) 148/62 (!) 179/69  Pulse: 88 82  Resp: 19 19  Temp: 99.6 F (37.6 C) 98.2 F (36.8 C)   Vitals:   07/21/16 0444 07/21/16 1445 07/21/16 2136 07/22/16 0517  BP: (!) 151/66 110/76 (!) 148/62 (!) 179/69  Pulse: 90 78 88 82  Resp: 20 18 19 19   Temp: 98.8 F (37.1 C) 98.2 F (36.8 C) 99.6 F (37.6 C) 98.2 F (36.8 C)  TempSrc: Oral Oral Oral Oral  SpO2: 90% 97% 95% 95%  Weight:      Height:         General exam:  Adult female.  No acute distress.  lying in bed.  Family at bedside HEENT:  NCAT, MMM Respiratory system: Clear to auscultation bilaterally Cardiovascular system: Regular rate and rhythm, normal S1/S2. No murmurs, rubs, gallops or clicks.  Warm extremities Gastrointestinal system: Normal active bowel sounds, soft, nondistended, nontender. MSK:  Normal tone and bulk, no lower extremity edema.  Left hand and third finger bandaged.  2+ radial pulse, less than 2 sec CR.  Able to wiggle fingers  on left hand.    Neuro:  Grossly intact   The results of significant diagnostics from this hospitalization (including imaging, microbiology, ancillary and laboratory) are listed below for reference.     Microbiology: Recent Results (from the past 240 hour(s))  Aerobic/Anaerobic Culture (surgical/deep wound)     Status: None (Preliminary result)   Collection Time: 07/19/16 10:21 PM  Result Value Ref Range Status   Specimen Description ABSCESS FINGER LEFT  Final   Special Requests NONE  Final   Gram Stain   Final    FEW WBC PRESENT, PREDOMINANTLY PMN FEW GRAM POSITIVE COCCI IN PAIRS Performed at [facility], [address], [place], [place] [address]    Culture   Final    ABUNDANT METHICILLIN RESISTANT STAPHYLOCOCCUS AUREUS NO ANAEROBES ISOLATED; CULTURE IN PROGRESS FOR 5 DAYS    Report Status PENDING  Incomplete   Organism ID, Bacteria METHICILLIN RESISTANT STAPHYLOCOCCUS AUREUS  Final      Susceptibility   Methicillin resistant staphylococcus aureus - MIC*    CIPROFLOXACIN >=8 RESISTANT Resistant     ERYTHROMYCIN >=8 RESISTANT Resistant     GENTAMICIN <=0.5 SENSITIVE Sensitive     OXACILLIN >=4 RESISTANT Resistant     TETRACYCLINE <=1 SENSITIVE Sensitive     VANCOMYCIN 1 SENSITIVE Sensitive     TRIMETH/SULFA <=10 SENSITIVE Sensitive     CLINDAMYCIN <=0.25 SENSITIVE Sensitive     RIFAMPIN <=0.5 SENSITIVE Sensitive     Inducible Clindamycin NEGATIVE Sensitive     * ABUNDANT METHICILLIN RESISTANT STAPHYLOCOCCUS AUREUS  Culture, blood (routine x 2)     Status: None (Preliminary result)   Collection Time: 07/20/16 12:33 AM  Result Value Ref Range Status   Specimen Description BLOOD RIGHT HAND  Final   Special Requests IN PEDIATRIC BOTTLE 2CC  Final   Culture   Final    NO GROWTH 1 DAY Performed at [facility], [address], [place], [place] [address]    Report Status PENDING  Incomplete  Culture, blood (routine x 2)     Status:  None (Preliminary result)    Collection Time: 07/20/16 12:33 AM  Result Value Ref Range Status   Specimen Description BLOOD RIGHT ANTECUBITAL  Final   Special Requests IN PEDIATRIC BOTTLE 2CC  Final   Culture   Final    NO GROWTH 1 DAY Performed at [facility], [address] [place], [place] [address]    Report Status PENDING  Incomplete     Labs: BNP (last 3 results) No results for input(s): BNP in the last 8760 hours. Basic Metabolic Panel:  Recent Labs Lab 07/19/16 1725 07/20/16 0038 07/21/16 0433 07/22/16 0412  NA 139 137  --   --   K 3.6 3.6  --   --   CL 104 106  --   --   CO2 27 24  --   --   GLUCOSE 162* 156*  --   --   BUN 23* 19  --   --   CREATININE 1.06* 0.84 0.66 0.67  CALCIUM 9.7 8.6*  --   --   MG  --  1.7  --   --   PHOS  --  3.2  --   --    Liver Function Tests:  Recent Labs Lab 07/19/16 1725 07/20/16 0038  AST 21 23  ALT 16 13*  ALKPHOS 114 89  BILITOT 0.5 0.7  PROT 7.0 5.7*  ALBUMIN 3.8 3.2*   No results for input(s): LIPASE, AMYLASE in the last 168 hours. No results for input(s): AMMONIA in the last 168 hours. CBC:  Recent Labs Lab 07/19/16 1725 07/20/16 0038 07/21/16 0433 07/22/16 0412  WBC 10.6* 8.1 5.1 5.9  NEUTROABS 6.9  --   --   --   HGB 12.9 11.3* 10.5* 10.3*  HCT 40.1 34.3* 32.9* 32.1*  MCV 85.3 85.1 87.5 86.5  PLT 200 178 144* 153   Cardiac Enzymes: No results for input(s): CKTOTAL, CKMB, CKMBINDEX, TROPONINI in the last 168 hours. BNP: Invalid input(s): POCBNP CBG:  Recent Labs Lab 07/21/16 0450 07/21/16 0805 07/21/16 1222 07/21/16 1613 07/21/16 2124  GLUCAP 100* 107* 141* 137* 113*   D-Dimer No results for input(s): DDIMER in the last 72 hours. Hgb A1c  Recent Labs  07/20/16 0038  HGBA1C 6.5*   Lipid Profile No results for input(s): CHOL, HDL, LDLCALC, TRIG, CHOLHDL, LDLDIRECT in the last 72 hours. Thyroid function studies  Recent Labs  07/20/16 0038  TSH 2.662   Anemia work up No results for input(s):  VITAMINB12, FOLATE, FERRITIN, TIBC, IRON, RETICCTPCT in the last 72 hours. Urinalysis    Component Value Date/Time   COLORURINE YELLOW 04/01/2016 1546   APPEARANCEUR CLOUDY (A) 04/01/2016 1546   APPEARANCEUR Cloudy (A) 11/17/2013 1053   LABSPEC 1.025 04/01/2016 1546   PHURINE 6.0 04/01/2016 1546   GLUCOSEU NEGATIVE 04/01/2016 1546   HGBUR NEGATIVE 04/01/2016 1546   BILIRUBINUR NEGATIVE 04/01/2016 1546   BILIRUBINUR Negative 11/17/2013 1053   KETONESUR NEGATIVE 04/01/2016 1546   PROTEINUR NEGATIVE 04/01/2016 1546   UROBILINOGEN 0.2 11/10/2008 1224   NITRITE NEGATIVE 04/01/2016 1546   LEUKOCYTESUR SMALL (A) 04/01/2016 1546   LEUKOCYTESUR Negative 11/17/2013 1053   Sepsis Labs Invalid input(s): PROCALCITONIN,  WBC,  LACTICIDVEN   Time coordinating discharge: Over 30 minutes  SIGNED:   Renae FickleSHORT, Tamarius Rosenfield, MD  [facility] 07/22/2016, 12:30 PM Pager   If 7PM-7AM, please contact night-coverage [email or web address] Password [email or web address]

## 2016-07-22 NOTE — Op Note (Signed)
NAMEANISHKA, BUSHARD           ACCOUNT NO.:  [id number]  MEDICAL RECORD NO.:  [id number]  LOCATION:                                 FACILITY:  PHYSICIAN:  Betha Loa, MD        DATE OF BIRTH:  1936-02-18  DATE OF PROCEDURE:  07/19/2016 DATE OF DISCHARGE:                              OPERATIVE REPORT   PREOPERATIVE DIAGNOSIS:  Left long finger infection.  POSTOPERATIVE DIAGNOSIS:  Left long finger paronychia with epidermolysis.  PROCEDURE:  Left long finger incision and drainage including removal of nail plate for paronychia and incision for infection and pad.  SURGEON:  Betha Loa, MD  ASSISTANT:  None.  ANESTHESIA:  General.  IV FLUIDS:  Per anesthesia flow sheet.  ESTIMATED BLOOD LOSS:  Minimal.  COMPLICATIONS:  None.  SPECIMENS:  Cultures to Micro.  TOURNIQUET TIME:  21 minutes.  DISPOSITION:  Stable to PACU.  INDICATIONS:  Jody Porter is a 81 year old female who over the past few days has been having increasing issues with the left long finger. She had swelling erythema and purulent drainage.  I recommended incision and drainage in the operating room.  Risks, benefits and alternatives of the surgery were discussed including the risk of blood loss; infection; damage to nerves, vessels, tendons, ligaments, bone; failure of surgery; need for additional surgery; complications with wound healing; continued pain; continued infection; need for repeat irrigation and debridement. She voiced understanding of these risks and elected to proceed.  OPERATIVE COURSE:  After being identified preoperatively by myself, the patient and I agreed upon the procedure and site of procedure.  Surgical site was marked.  Risks, benefits and alternatives of the surgery were reviewed and she wished to proceed.  Surgical consent had been signed. Antibiotics were held for cultures.  She was transferred to the operating room and placed on the operating room table in supine  position with the left upper extremity on an armboard.  General anesthesia was induced by Anesthesiology.  Left upper extremity was prepped and draped in normal sterile orthopedic fashion.  A surgical pause was performed between the surgeons, anesthesia and operating room staff and all were in agreement as to the patient, procedure and site of procedure. Tourniquet at the proximal aspect of the forearm was inflated to 250 mmHg after exsanguination of the limb with an Esmarch bandage.  The finger was explored.  There was purulence coming from underneath the nail plate.  The nail plate was easily elevated and removed.  There was purulence underneath.  There was some damage to the nailbed.  This purulent area was removed.  This was central and radial.  There was purulence underneath the nail fold.  This was removed with a Therapist, nutritional.  Cultures were taken for aerobes and anaerobes.  There was an area where it appeared that the purulence was going more toward the pad of the finger.  An incision was made at the radial side of the pad and carried into subcutaneous tissues by spreading technique.  The septae was separated.  There was no gross purulence within the pad of the finger.  On the dorsum of the finger proximal to the nail, there had been epidermolysis.  This was debrided.  There were some areas of skin with a thickened whitish eschar-like material.  This was sharply debrided with the knife.  Once all devitalized or purulent material was removed, the finger and wound were copiously irrigated with sterile saline.  A piece of Xeroform was placed in nailfold and the wound was dressed with sterile Xeroform.  The incision for the felon was packed with 0.25-inch iodoform gauze.  The wounds were then dressed with sterile 4 x 4, and wrapped with a Coban dressing lightly.  Alumafoam splint was placed and wrapped lightly with Coban dressing.  The tourniquet was deflated at 21 minutes.  She was  given IV vancomycin after cultures had been taken.  Operative drapes were broken down and the patient was awakened from anesthesia safely.  She was transferred back to the stretcher and taken to PACU in stable condition.  She is being admitted by the Hospitalist.  I will plan to see her back in the office on Monday or Tuesday for postoperative followup.  Would recommend antibiotic coverage with either Bactrim or doxycycline for coverage of MRSA.     Betha LoaKevin Wayde Gopaul, MD   ______________________________ Betha LoaKevin Amanuel Sinkfield, MD    KK/MEDQ  D:  07/19/2016  T:  07/19/2016  Job:  [id number]

## 2016-07-22 NOTE — Evaluation (Signed)
Physical Therapy Evaluation Patient Details Name: Jody Porter MRN: [id number] DOB: 03/30/1936 Today's Date: 07/22/2016   History of Present Illness  Jody Porter is a 81 y.o. female with history of hypothyroidism, overactive bladder, hypertension, memory deficits or depression who presented with a two day history of worsening swelling and darkening of the skin on the left 3rd finger.  She had a cyst and blister which popped.  In the ER, she was found to have left 3rd finger infection/paronychia.  Hand surgery was consulted and performed I&D on 3/9.  Clinical Impression  The patient was found in the  BR and complaining of feeling weak, nauseated and dizzy. Assisted with pericare and assisted to ambulate back to the bed. RN notified. Noted drainage on dressing of the left hand. Pt admitted with above diagnosis. Pt currently with functional limitations due to the deficits listed below (see PT Problem List). Pt will benefit from skilled PT to increase their independence and safety with mobility to allow discharge to the venue listed below.       Follow Up Recommendations  none    Equipment Recommendations    none   Recommendations for Other Services       Precautions / Restrictions Precautions Precautions: Fall      Mobility  Bed Mobility Overal bed mobility: Independent                Transfers Overall transfer level: Needs assistance Equipment used: Rolling walker (2 wheeled) Transfers: Sit to/from Stand Sit to Stand: Supervision            Ambulation/Gait Ambulation/Gait assistance: Supervision Ambulation Distance (Feet): 12 Feet Assistive device: Rolling walker (2 wheeled) Gait Pattern/deviations: Step-through pattern     General Gait Details: patient was limited due to feeling nauseated /dizzy ans sick feeling while in BR. BP 156/57  Stairs            Wheelchair Mobility    Modified Rankin (Stroke Patients Only)       Balance  Overall balance assessment: History of Falls;Needs assistance Sitting-balance support: No upper extremity supported Sitting balance-Leahy Scale: Good     Standing balance support: During functional activity;Bilateral upper extremity supported Standing balance-Leahy Scale: Fair                               Pertinent Vitals/Pain Pain Assessment: 0-10 Pain Score: 3  Pain Location: left finger Pain Descriptors / Indicators: Aching Pain Intervention(s): Monitored during session    Home Living Family/patient expects to be discharged to:: Private residence Living Arrangements: Children;Other relatives Available Help at Discharge: Family Type of Home: House Home Access: Stairs to enter Entrance Stairs-Rails: None Entrance Stairs-Number of Steps: 3 Home Layout: Multi-level Home Equipment: Walker - 4 wheels;Walker - 2 wheels;Shower seat;Wheelchair - manual      Prior Function Level of Independence: Independent with assistive device(s)               Hand Dominance        Extremity/Trunk Assessment   Upper Extremity Assessment Upper Extremity Assessment: Overall WFL for tasks assessed;LUE deficits/detail LUE Deficits / Details: noted drainage through dressing on 3rd finger. RN aware.    Lower Extremity Assessment Lower Extremity Assessment: Overall WFL for tasks assessed    Cervical / Trunk Assessment Cervical / Trunk Assessment: Normal  Communication   Communication: No difficulties  Cognition Arousal/Alertness: Awake/alert Behavior During Therapy: WFL for tasks assessed/performed Overall Cognitive  Status: Within Functional Limits for tasks assessed                      General Comments      Exercises     Assessment/Plan    PT Assessment Patient needs continued PT services  PT Problem List Decreased activity tolerance;Decreased mobility;Decreased knowledge of use of DME;Decreased safety awareness;Decreased knowledge of precautions        PT Treatment Interventions DME instruction;Gait training;Functional mobility training;Therapeutic activities;Stair training;Therapeutic exercise;Patient/family education    PT Goals (Current goals can be found in the Care Plan section)  Acute Rehab PT Goals Patient Stated Goal: to go home PT Goal Formulation: With patient/family Time For Goal Achievement: 07/29/16 Potential to Achieve Goals: Good    Frequency Min 3X/week   Barriers to discharge        Co-evaluation               End of Session   Activity Tolerance: Patient limited by fatigue;Treatment limited secondary to medical complications (Comment) Patient left: in bed;with call bell/phone within reach;with bed alarm set;with family/visitor present Nurse Communication: Mobility status PT Visit Diagnosis: Unsteadiness on feet (R26.81);History of falling (Z91.81);Dizziness and giddiness (R42)         Time: 4098-1191 PT Time Calculation (min) (ACUTE ONLY): 16 min   Charges:   PT Evaluation $PT Eval Low Complexity: 1 Procedure     PT G CodesBlanchard Kelch PT [phone]  Rada Hay 07/22/2016, 8:59 AM

## 2016-07-23 NOTE — Anesthesia Postprocedure Evaluation (Signed)
Anesthesia Post Note  Patient: Jody OsierJessie D Porter  Procedure(Porter) Performed: Procedure(Porter) (LRB): IRRIGATION AND DEBRIDEMENT LEFT LONG FINGER (Left)  Patient location during evaluation: PACU Anesthesia Type: General Level of consciousness: awake and alert Pain management: pain level controlled Vital Signs Assessment: post-procedure vital signs reviewed and stable Respiratory status: nonlabored ventilation Cardiovascular status: blood pressure returned to baseline and stable Postop Assessment: no signs of nausea or vomiting Anesthetic complications: no       Last Vitals:  Vitals:   07/21/16 2136 07/22/16 0517  BP: (!) 148/62 (!) 179/69  Pulse: 88 82  Resp: 19 19  Temp: 37.6 C 36.8 C    Last Pain:  Vitals:   07/22/16 0517  TempSrc: Oral  PainSc:                  Jody Porter

## 2016-07-25 LAB — AEROBIC/ANAEROBIC CULTURE (SURGICAL/DEEP WOUND)

## 2016-07-25 LAB — CULTURE, BLOOD (ROUTINE X 2)
Culture: NO GROWTH
Culture: NO GROWTH

## 2016-08-10 ENCOUNTER — Observation Stay (HOSPITAL_COMMUNITY)
Admission: EM | Admit: 2016-08-10 | Discharge: 2016-08-14 | Disposition: A | Discharge status: Home / Self Care | Payer: Medicare (Managed Care) | Attending: Internal Medicine | Admitting: Internal Medicine

## 2016-08-10 ENCOUNTER — Encounter (HOSPITAL_COMMUNITY): Payer: Self-pay | Admitting: Emergency Medicine

## 2016-08-10 ENCOUNTER — Emergency Department (HOSPITAL_COMMUNITY): Payer: Medicare (Managed Care)

## 2016-08-10 DIAGNOSIS — Z9102 Food additives allergy status: Secondary | ICD-10-CM | POA: Insufficient documentation

## 2016-08-10 DIAGNOSIS — R32 Unspecified urinary incontinence: Secondary | ICD-10-CM | POA: Diagnosis not present

## 2016-08-10 DIAGNOSIS — Z7984 Long term (current) use of oral hypoglycemic drugs: Secondary | ICD-10-CM | POA: Insufficient documentation

## 2016-08-10 DIAGNOSIS — R4701 Aphasia: Secondary | ICD-10-CM | POA: Diagnosis not present

## 2016-08-10 DIAGNOSIS — Z888 Allergy status to other drugs, medicaments and biological substances status: Secondary | ICD-10-CM | POA: Insufficient documentation

## 2016-08-10 DIAGNOSIS — H3581 Retinal edema: Secondary | ICD-10-CM | POA: Diagnosis not present

## 2016-08-10 DIAGNOSIS — Z6841 Body Mass Index (BMI) 40.0 and over, adult: Secondary | ICD-10-CM | POA: Diagnosis not present

## 2016-08-10 DIAGNOSIS — Z881 Allergy status to other antibiotic agents status: Secondary | ICD-10-CM | POA: Insufficient documentation

## 2016-08-10 DIAGNOSIS — R413 Other amnesia: Secondary | ICD-10-CM | POA: Diagnosis not present

## 2016-08-10 DIAGNOSIS — D649 Anemia, unspecified: Secondary | ICD-10-CM | POA: Diagnosis not present

## 2016-08-10 DIAGNOSIS — Z9842 Cataract extraction status, left eye: Secondary | ICD-10-CM | POA: Insufficient documentation

## 2016-08-10 DIAGNOSIS — E892 Postprocedural hypoparathyroidism: Secondary | ICD-10-CM | POA: Insufficient documentation

## 2016-08-10 DIAGNOSIS — M1990 Unspecified osteoarthritis, unspecified site: Secondary | ICD-10-CM | POA: Insufficient documentation

## 2016-08-10 DIAGNOSIS — Z961 Presence of intraocular lens: Secondary | ICD-10-CM | POA: Insufficient documentation

## 2016-08-10 DIAGNOSIS — K900 Celiac disease: Secondary | ICD-10-CM | POA: Insufficient documentation

## 2016-08-10 DIAGNOSIS — Z8042 Family history of malignant neoplasm of prostate: Secondary | ICD-10-CM | POA: Insufficient documentation

## 2016-08-10 DIAGNOSIS — F410 Panic disorder [episodic paroxysmal anxiety] without agoraphobia: Secondary | ICD-10-CM | POA: Insufficient documentation

## 2016-08-10 DIAGNOSIS — F419 Anxiety disorder, unspecified: Secondary | ICD-10-CM

## 2016-08-10 DIAGNOSIS — F332 Major depressive disorder, recurrent severe without psychotic features: Secondary | ICD-10-CM | POA: Diagnosis not present

## 2016-08-10 DIAGNOSIS — G459 Transient cerebral ischemic attack, unspecified: Secondary | ICD-10-CM | POA: Diagnosis not present

## 2016-08-10 DIAGNOSIS — G4700 Insomnia, unspecified: Secondary | ICD-10-CM | POA: Insufficient documentation

## 2016-08-10 DIAGNOSIS — M545 Low back pain: Secondary | ICD-10-CM | POA: Insufficient documentation

## 2016-08-10 DIAGNOSIS — Z820 Family history of epilepsy and other diseases of the nervous system: Secondary | ICD-10-CM | POA: Insufficient documentation

## 2016-08-10 DIAGNOSIS — Z809 Family history of malignant neoplasm, unspecified: Secondary | ICD-10-CM | POA: Insufficient documentation

## 2016-08-10 DIAGNOSIS — G4730 Sleep apnea, unspecified: Secondary | ICD-10-CM | POA: Diagnosis not present

## 2016-08-10 DIAGNOSIS — R109 Unspecified abdominal pain: Secondary | ICD-10-CM | POA: Diagnosis present

## 2016-08-10 DIAGNOSIS — K219 Gastro-esophageal reflux disease without esophagitis: Secondary | ICD-10-CM | POA: Insufficient documentation

## 2016-08-10 DIAGNOSIS — L03119 Cellulitis of unspecified part of limb: Secondary | ICD-10-CM | POA: Insufficient documentation

## 2016-08-10 DIAGNOSIS — J309 Allergic rhinitis, unspecified: Secondary | ICD-10-CM | POA: Insufficient documentation

## 2016-08-10 DIAGNOSIS — N3289 Other specified disorders of bladder: Secondary | ICD-10-CM | POA: Insufficient documentation

## 2016-08-10 DIAGNOSIS — I10 Essential (primary) hypertension: Secondary | ICD-10-CM | POA: Insufficient documentation

## 2016-08-10 DIAGNOSIS — Z880 Allergy status to penicillin: Secondary | ICD-10-CM | POA: Insufficient documentation

## 2016-08-10 DIAGNOSIS — Z7902 Long term (current) use of antithrombotics/antiplatelets: Secondary | ICD-10-CM | POA: Insufficient documentation

## 2016-08-10 DIAGNOSIS — Z8261 Family history of arthritis: Secondary | ICD-10-CM | POA: Insufficient documentation

## 2016-08-10 DIAGNOSIS — Z7982 Long term (current) use of aspirin: Secondary | ICD-10-CM | POA: Insufficient documentation

## 2016-08-10 DIAGNOSIS — K5900 Constipation, unspecified: Secondary | ICD-10-CM | POA: Diagnosis not present

## 2016-08-10 DIAGNOSIS — Z9841 Cataract extraction status, right eye: Secondary | ICD-10-CM | POA: Insufficient documentation

## 2016-08-10 DIAGNOSIS — E038 Other specified hypothyroidism: Secondary | ICD-10-CM | POA: Diagnosis not present

## 2016-08-10 DIAGNOSIS — R4789 Other speech disturbances: Secondary | ICD-10-CM

## 2016-08-10 DIAGNOSIS — R7303 Prediabetes: Secondary | ICD-10-CM | POA: Insufficient documentation

## 2016-08-10 DIAGNOSIS — F0390 Unspecified dementia without behavioral disturbance: Secondary | ICD-10-CM | POA: Diagnosis not present

## 2016-08-10 DIAGNOSIS — Z885 Allergy status to narcotic agent status: Secondary | ICD-10-CM | POA: Insufficient documentation

## 2016-08-10 LAB — COMPREHENSIVE METABOLIC PANEL
ALT: 17 U/L (ref 14–54)
AST: 29 U/L (ref 15–41)
Albumin: 4.3 g/dL (ref 3.5–5.0)
Alkaline Phosphatase: 106 U/L (ref 38–126)
Anion gap: 15 (ref 5–15)
BUN: 16 mg/dL (ref 6–20)
CO2: 19 mmol/L — ABNORMAL LOW (ref 22–32)
Calcium: 10.6 mg/dL — ABNORMAL HIGH (ref 8.9–10.3)
Chloride: 106 mmol/L (ref 101–111)
Creatinine, Ser: 0.98 mg/dL (ref 0.44–1.00)
GFR calc Af Amer: >60 mL/min (ref >60)
GFR calc non Af Amer: 54 mL/min — ABNORMAL LOW (ref >60)
Glucose, Bld: 156 mg/dL — ABNORMAL HIGH (ref 65–99)
Potassium: 3.5 mmol/L (ref 3.5–5.1)
Sodium: 140 mmol/L (ref 135–145)
Total Bilirubin: 0.8 mg/dL (ref 0.3–1.2)
Total Protein: 7.4 g/dL (ref 6.5–8.1)

## 2016-08-10 LAB — CBC
HCT: 36.7 % (ref 36.0–46.0)
Hemoglobin: 11.9 g/dL — ABNORMAL LOW (ref 12.0–15.0)
MCH: 27.5 pg (ref 26.0–34.0)
MCHC: 32.4 g/dL (ref 30.0–36.0)
MCV: 84.8 fL (ref 78.0–100.0)
Platelets: 222 10*3/uL (ref 150–400)
RBC: 4.33 MIL/uL (ref 3.87–5.11)
RDW: 15.2 % (ref 11.5–15.5)
WBC: 8.7 10*3/uL (ref 4.0–10.5)

## 2016-08-10 LAB — LIPASE, BLOOD: Lipase: 21 U/L (ref 11–51)

## 2016-08-10 MED ORDER — HALOPERIDOL LACTATE 5 MG/ML IJ SOLN
2.0000 mg | Freq: Once | INTRAMUSCULAR | Status: AC
  Administered 2016-08-10: 2 mg via INTRAMUSCULAR
  Filled 2016-08-10: qty 1

## 2016-08-10 MED ORDER — LORAZEPAM 1 MG PO TABS
1.0000 mg | ORAL_TABLET | Freq: Once | ORAL | Status: AC
  Administered 2016-08-10: 1 mg via ORAL
  Filled 2016-08-10: qty 1

## 2016-08-10 NOTE — ED Provider Notes (Signed)
[facility]-EMERGENCY DEPT Provider Note   CSN: [id number] Arrival date & time: 08/10/16  1954  By signing my name below, I, Rosario Adie, attest that this documentation has been prepared under the direction and in the presence of Raeford Razor, MD. Electronically Signed: Rosario Adie, ED Scribe. 08/10/16. 10:55 PM.  History   Chief Complaint Chief Complaint  Patient presents with  . Abdominal Pain  . Panic Attack   The history is provided by the patient and a relative. No language interpreter was used.   HPI Comments: Jody Porter is a 81 y.o. female with a PMHx anxiety, depression, HTN, GERD, obesity, who presents to the Emergency Department complaining of persistent, worsening anxiousness beginning this early this morning. She notes associated shortness of breath and speech difficulty which she describes as being unable to form the words that she is thinking. Per daughter, she also has been commenting on several objects on the walls around her which have not actually been there. Per family, pt has a h/o panic attacks which her symptoms today are consistent with; however, her prior symptoms associated with this have not previously been associated with speech difficulty. Pt took Ativan and Benadryl today with little improvement of her symptoms. No recent medication alterations. No h/o similar episodes. Pt is currently followed by psychotherapist and she has been compliant with her anti-anxiety medications recently. She denies numbness, paraesthesias, sleep disturbance, or any other associated symptoms.   Past Medical History:  Diagnosis Date  . Allergic rhinitis   . Anxiety   . Celiac disease    Patient denies  . Degenerative arthritis   . Depression   . GERD (gastroesophageal reflux disease)   . Hyperparathyroidism   . Hypertension   . Hypothyroid   . Insomnia    w/ sleep apnea NPSG 07/13/2009 AHI 3.5, RDI 29.9/hr  . LBP (low back pain)   . Macular edema    OD  .  Memory difficulties 01/27/2014  . Obesity   . Prediabetes   . Urinary incontinence    Patient Active Problem List   Diagnosis Date Noted  . MRSA (methicillin resistant Staphylococcus aureus) infection 07/22/2016  . Infection of finger 07/21/2016  . Cellulitis of hand 07/19/2016  . Prediabetes 07/19/2016  . MDD (major depressive disorder), recurrent episode, severe (HCC) 04/01/2016  . Memory difficulties 01/27/2014  . Solitary pulmonary nodule 01/19/2014  . Other specified hypothyroidism 11/23/2013  . Depression 11/23/2013  . Essential hypertension, benign 11/23/2013  . Low urine output 11/23/2013  . Other malaise and fatigue 11/23/2013  . Cough 05/12/2013  . Severe obesity (BMI >= 40) (HCC) 01/08/2013  . ALLERGIC RHINITIS 08/21/2009  . INSOMNIA WITH SLEEP APNEA UNSPECIFIED 08/18/2009  . Hypothyroidism 02/24/2007  . DEPRESSION 02/24/2007  . Essential hypertension 02/24/2007  . GERD 02/24/2007  . LOW BACK PAIN 02/24/2007  . HYPERPARATHYROIDISM, HX OF 02/24/2007   Past Surgical History:  Procedure Laterality Date  . carpel tunnel Bilateral   . cataracts    . CESAREAN SECTION    . CHOLECYSTECTOMY    . I&D EXTREMITY Left 07/19/2016   Procedure: IRRIGATION AND DEBRIDEMENT LEFT LONG FINGER;  Surgeon: Betha Loa, MD;  Location: [facility]S;  Service: Orthopedics;  Laterality: Left;  . KNEE SURGERY Right    reports multiple surgery on knee  . LUMBAR DISC SURGERY    . PARATHYROIDECTOMY     OB History    No data available     Home Medications    Prior to Admission  medications   Medication Sig Start Date End Date Taking? Authorizing Provider  acetaminophen (TYLENOL) 500 MG tablet Take 1,000 mg by mouth 2 (two) times daily as needed for mild pain.    Historical Provider, MD  doxycycline (VIBRAMYCIN) 100 MG capsule Take 1 capsule (100 mg total) by mouth 2 (two) times daily. 07/22/16   Renae Fickle, MD  esomeprazole (NEXIUM) 20 MG capsule Take 20 mg by mouth daily at 12 noon.     Historical Provider, MD  HYDROcodone-acetaminophen (NORCO/VICODIN) 5-325 MG tablet Take 1-2 tablets by mouth every 8 (eight) hours as needed for severe pain.  02/16/16   Historical Provider, MD  levothyroxine (SYNTHROID, LEVOTHROID) 112 MCG tablet TAKE 1 TABLET (112 MCG TOTAL) BY MOUTH DAILY. 09/19/14   Sharon Seller, NP  LORazepam (ATIVAN) 0.5 MG tablet Take 0.5 mg by mouth 3 (three) times daily.  02/29/16   Historical Provider, MD  metFORMIN (GLUCOPHAGE) 500 MG tablet Take 500 mg by mouth daily. 12/30/14   Historical Provider, MD  mirtazapine (REMERON) 30 MG tablet Take 30 mg by mouth at bedtime.    Historical Provider, MD  Multiple Vitamins-Minerals (HAIR SKIN AND NAILS FORMULA) TABS Take 1 tablet by mouth daily.    Historical Provider, MD  Omega-3 Fatty Acids (FISH OIL) 1000 MG CAPS Take 1 capsule by mouth daily.    Historical Provider, MD  oxybutynin (DITROPAN-XL) 5 MG 24 hr tablet TAKE 1 TABLET (5 MG) BY MOUTH DAILY 06/23/16   Historical Provider, MD  QUEtiapine (SEROQUEL) 25 MG tablet Take 25 mg by mouth at bedtime.    Historical Provider, MD  simvastatin (ZOCOR) 20 MG tablet Take 20 mg by mouth at bedtime. 01/21/15   Historical Provider, MD   Family History Family History  Problem Relation Age of Onset  . Cancer Mother   . Seizures Mother     grand mal  . Cancer Father     prostate  . Arthritis/Rheumatoid Sister   . Cancer Paternal Grandmother   . Cancer Maternal Grandmother    Social History Social History  Substance Use Topics  . Smoking status: Never Smoker  . Smokeless tobacco: Never Used  . Alcohol use No   Allergies   Aricept [donepezil hcl]; Sudafed [pseudoephedrine hcl]; Sugar-protein-starch; Ciprofloxacin; Prednisone; Valium [diazepam]; Hydroxyzine; Other; Acyclovir; and Penicillins  Review of Systems Review of Systems  Eyes: Positive for visual disturbance.  Respiratory: Positive for shortness of breath.   Gastrointestinal: Positive for abdominal pain.    Neurological: Negative for numbness.  Psychiatric/Behavioral: Positive for hallucinations (visual). Negative for sleep disturbance. The patient is nervous/anxious.   All other systems reviewed and are negative.  Physical Exam Updated Vital Signs BP (!) 121/100 (BP Location: Left Arm)   Pulse 88   Temp 98.2 F (36.8 C) (Oral)   Resp 20   SpO2 99%   Physical Exam  Constitutional: She is oriented to person, place, and time. She appears well-developed and well-nourished.  HENT:  Head: Normocephalic.  Right Ear: External ear normal.  Left Ear: External ear normal.  Nose: Nose normal.  Mouth/Throat: Oropharynx is clear and moist.  Eyes: Conjunctivae are normal. Right eye exhibits no discharge. Left eye exhibits no discharge.  Neck: Normal range of motion.  Cardiovascular: Normal rate, regular rhythm and normal heart sounds.   No murmur heard. Pulmonary/Chest: Effort normal and breath sounds normal. No respiratory distress. She has no wheezes. She has no rales.  Abdominal: Soft. She exhibits no distension. There is no tenderness. There is  no rebound and no guarding.  Musculoskeletal: Normal range of motion. She exhibits no edema or tenderness.  Neurological: She is alert and oriented to person, place, and time. She has normal strength. She displays normal reflexes. No cranial nerve deficit or sensory deficit. She exhibits normal muscle tone. Coordination normal.  Seems extremely anxious. Pt can't seem to sit still. I got her a reclining chair to sit in because she couldn't tolerate sitting still in her hospital bed.  Pt is hyperventilating.  Some difficulty with word finding and at times expressive aphasia, but at other times speech is fluent. Good finger to nose testing bilaterally.   Skin: Skin is warm and dry. No rash noted. No erythema. No pallor.  Psychiatric:  Seems extremely anxious. Pt is hyperventilating. Pt can't seem to sit still. At times seems to have expressive aphasia, but  at other times speech is fluent.   Nursing note and vitals reviewed.  ED Treatments / Results  DIAGNOSTIC STUDIES: Oxygen Saturation is 99% on RA, normal by my interpretation.   COORDINATION OF CARE: 10:55 PM-Discussed next steps with pt. Pt verbalized understanding and is agreeable with the plan.   Labs (all labs ordered are listed, but only abnormal results are displayed) Labs Reviewed  COMPREHENSIVE METABOLIC PANEL - Abnormal; Notable for the following:       Result Value   CO2 19 (*)    Glucose, Bld 156 (*)    Calcium 10.6 (*)    GFR calc non Af Amer 54 (*)    All other components within normal limits  CBC - Abnormal; Notable for the following:    Hemoglobin 11.9 (*)    All other components within normal limits  LIPASE, BLOOD  URINALYSIS, ROUTINE W REFLEX MICROSCOPIC   EKG  EKG Interpretation None      Radiology Ct Head Wo Contrast  Result Date: 08/11/2016 CLINICAL DATA:  Expressive aphasia. EXAM: CT HEAD WITHOUT CONTRAST TECHNIQUE: Contiguous axial images were obtained from the base of the skull through the vertex without intravenous contrast. COMPARISON:  Head CT 10/17/2015 FINDINGS: Brain: No evidence of acute infarction, hemorrhage, hydrocephalus, extra-axial collection or mass lesion/mass effect. Age related atrophy and mild chronic small vessel ischemia, stable from prior exam. Vascular: No hyperdense vessel or unexpected calcification. Skull: Stable scleroses in the left frontal bone. No acute abnormality. Sinuses/Orbits: Paranasal sinuses and mastoid air cells are clear. The visualized orbits are unremarkable. Other: Bilateral cataract resection. IMPRESSION: No acute intracranial abnormality. Electronically Signed   By: Rubye Oaks M.D.   On: 08/11/2016 00:04    Procedures Procedures   Medications Ordered in ED Medications - No data to display  Initial Impression / Assessment and Plan / ED Course  I have reviewed the triage vital signs and the nursing  notes.  Pertinent labs & imaging results that were available during my care of the patient were reviewed by me and considered in my medical decision making (see chart for details).  80yF with aphasia. Primarily seems anomic but at times more expressive. Unfortunately, I couldn't have her write anything because she doesn't at baseline. She is undoubtedly very anxious, but this is a long standing issue. Daughter at bedside confirms that she often hyperventilates and always speaks very quickly. Very unusual for her to have difficulty with word finding or say things that are at times nonsensical.   She also mentioned that she also feels like she is seeing things out of the corner of her eye. Her speech is also  pressured, although her daughter says this is typical for her. Raises question of psychogenic etiology.  She was given some ativan and a low dose of haldol. She fees a bit calmer, but no significant change in her symptoms otherwise. CT of the head w/o acute abnormality. Aspirin given. Outside the window for TPA with her noticing symptoms when she woke up this morning, meaning last known normal was sometime Friday night.   Final Clinical Impressions(s) / ED Diagnoses   Final diagnoses:  Word finding difficulty  Anxiety   New Prescriptions New Prescriptions   No medications on file   I personally preformed the services scribed in my presence. The recorded information has been reviewed is accurate. Raeford Razor, MD.     Raeford Razor, MD 08/11/16 [phone]

## 2016-08-10 NOTE — ED Triage Notes (Signed)
Pt reports having increasing abd pain for the last day and also experiencing panic attack today. Pt states she has not had a bowel movement since yesterday. Pt very anxious at time of triage and hyperventilating.

## 2016-08-10 NOTE — ED Notes (Signed)
Pt made aware of the need for UA. 

## 2016-08-11 ENCOUNTER — Observation Stay (HOSPITAL_BASED_OUTPATIENT_CLINIC_OR_DEPARTMENT_OTHER): Payer: Medicare (Managed Care)

## 2016-08-11 ENCOUNTER — Observation Stay (HOSPITAL_COMMUNITY): Payer: Medicare (Managed Care)

## 2016-08-11 ENCOUNTER — Encounter (HOSPITAL_COMMUNITY): Payer: Self-pay | Admitting: Internal Medicine

## 2016-08-11 DIAGNOSIS — I6789 Other cerebrovascular disease: Secondary | ICD-10-CM

## 2016-08-11 DIAGNOSIS — R4789 Other speech disturbances: Secondary | ICD-10-CM | POA: Diagnosis not present

## 2016-08-11 DIAGNOSIS — G459 Transient cerebral ischemic attack, unspecified: Secondary | ICD-10-CM

## 2016-08-11 DIAGNOSIS — F419 Anxiety disorder, unspecified: Secondary | ICD-10-CM

## 2016-08-11 LAB — COMPREHENSIVE METABOLIC PANEL
ALK PHOS: 94 U/L (ref 38–126)
ALT: 15 U/L (ref 14–54)
ANION GAP: 14 (ref 5–15)
AST: 28 U/L (ref 15–41)
Albumin: 3.8 g/dL (ref 3.5–5.0)
BUN: 15 mg/dL (ref 6–20)
CHLORIDE: 100 mmol/L — AB (ref 101–111)
CO2: 22 mmol/L (ref 22–32)
Calcium: 10.2 mg/dL (ref 8.9–10.3)
Creatinine, Ser: 1.09 mg/dL — ABNORMAL HIGH (ref 0.44–1.00)
GFR, EST AFRICAN AMERICAN: 55 mL/min — AB (ref >60)
GFR, EST NON AFRICAN AMERICAN: 47 mL/min — AB (ref >60)
GLUCOSE: 181 mg/dL — AB (ref 65–99)
POTASSIUM: 3.1 mmol/L — AB (ref 3.5–5.1)
Sodium: 136 mmol/L (ref 135–145)
Total Bilirubin: 0.9 mg/dL (ref 0.3–1.2)
Total Protein: 6.2 g/dL — ABNORMAL LOW (ref 6.5–8.1)

## 2016-08-11 LAB — URINALYSIS, ROUTINE W REFLEX MICROSCOPIC
BILIRUBIN URINE: NEGATIVE
GLUCOSE, UA: NEGATIVE mg/dL
HGB URINE DIPSTICK: NEGATIVE
KETONES UR: NEGATIVE mg/dL
LEUKOCYTES UA: NEGATIVE
NITRITE: NEGATIVE
PROTEIN: 30 mg/dL — AB
Specific Gravity, Urine: 1.015 (ref 1.005–1.030)
pH: 8 (ref 5.0–8.0)

## 2016-08-11 LAB — LIPID PANEL
CHOL/HDL RATIO: 3.2 ratio
Cholesterol: 187 mg/dL (ref 0–200)
HDL: 58 mg/dL (ref >40)
LDL CALC: 90 mg/dL (ref 0–99)
Triglycerides: 194 mg/dL — ABNORMAL HIGH (ref <150)
VLDL: 39 mg/dL (ref 0–40)

## 2016-08-11 LAB — D-DIMER, QUANTITATIVE: D-Dimer, Quant: 2.66 ug/mL-FEU — ABNORMAL HIGH (ref 0.00–0.50)

## 2016-08-11 MED ORDER — DULOXETINE HCL 30 MG PO CPEP
30.0000 mg | ORAL_CAPSULE | Freq: Every day | ORAL | Status: DC
  Administered 2016-08-11 – 2016-08-13 (×3): 30 mg via ORAL
  Filled 2016-08-11 (×3): qty 1

## 2016-08-11 MED ORDER — LEVOTHYROXINE SODIUM 112 MCG PO TABS
112.0000 ug | ORAL_TABLET | Freq: Every day | ORAL | Status: DC
  Administered 2016-08-11 – 2016-08-13 (×3): 112 ug via ORAL
  Filled 2016-08-11 (×3): qty 1

## 2016-08-11 MED ORDER — MEMANTINE HCL 10 MG PO TABS
10.0000 mg | ORAL_TABLET | Freq: Two times a day (BID) | ORAL | Status: DC
  Administered 2016-08-11 – 2016-08-13 (×6): 10 mg via ORAL
  Filled 2016-08-11 (×3): qty 1
  Filled 2016-08-11: qty 2
  Filled 2016-08-11 (×5): qty 1

## 2016-08-11 MED ORDER — QUETIAPINE FUMARATE 25 MG PO TABS
25.0000 mg | ORAL_TABLET | Freq: Every day | ORAL | Status: DC
  Administered 2016-08-11 – 2016-08-13 (×3): 25 mg via ORAL
  Filled 2016-08-11 (×3): qty 1

## 2016-08-11 MED ORDER — PRAVASTATIN SODIUM 40 MG PO TABS
40.0000 mg | ORAL_TABLET | Freq: Every day | ORAL | Status: DC
  Administered 2016-08-12 – 2016-08-13 (×2): 40 mg via ORAL
  Filled 2016-08-11 (×2): qty 1

## 2016-08-11 MED ORDER — LORAZEPAM 0.5 MG PO TABS
0.5000 mg | ORAL_TABLET | Freq: Three times a day (TID) | ORAL | Status: DC
  Administered 2016-08-11 – 2016-08-13 (×9): 0.5 mg via ORAL
  Filled 2016-08-11 (×9): qty 1

## 2016-08-11 MED ORDER — HYDROCODONE-ACETAMINOPHEN 5-325 MG PO TABS
1.0000 | ORAL_TABLET | Freq: Three times a day (TID) | ORAL | Status: DC | PRN
  Administered 2016-08-11 (×2): 2 via ORAL
  Filled 2016-08-11 (×3): qty 2

## 2016-08-11 MED ORDER — METFORMIN HCL 500 MG PO TABS
500.0000 mg | ORAL_TABLET | Freq: Every day | ORAL | Status: DC
  Administered 2016-08-11 – 2016-08-13 (×3): 500 mg via ORAL
  Filled 2016-08-11 (×3): qty 1

## 2016-08-11 MED ORDER — PANTOPRAZOLE SODIUM 40 MG PO TBEC
40.0000 mg | DELAYED_RELEASE_TABLET | Freq: Every day | ORAL | Status: DC
  Administered 2016-08-11 – 2016-08-13 (×3): 40 mg via ORAL
  Filled 2016-08-11 (×3): qty 1

## 2016-08-11 MED ORDER — SODIUM CHLORIDE 0.9 % IV SOLN
INTRAVENOUS | Status: AC
  Administered 2016-08-11: 04:00:00 via INTRAVENOUS

## 2016-08-11 MED ORDER — OXYBUTYNIN CHLORIDE ER 5 MG PO TB24
5.0000 mg | ORAL_TABLET | Freq: Every day | ORAL | Status: DC
  Administered 2016-08-11 – 2016-08-13 (×3): 5 mg via ORAL
  Filled 2016-08-11 (×4): qty 1

## 2016-08-11 MED ORDER — HYDROCODONE-ACETAMINOPHEN 5-325 MG PO TABS
1.0000 | ORAL_TABLET | Freq: Four times a day (QID) | ORAL | Status: DC | PRN
  Administered 2016-08-11 – 2016-08-12 (×2): 2 via ORAL
  Filled 2016-08-11: qty 2

## 2016-08-11 MED ORDER — ADULT MULTIVITAMIN W/MINERALS CH
1.0000 | ORAL_TABLET | Freq: Every day | ORAL | Status: DC
  Administered 2016-08-11 – 2016-08-13 (×3): 1 via ORAL
  Filled 2016-08-11 (×3): qty 1

## 2016-08-11 MED ORDER — POTASSIUM CHLORIDE CRYS ER 20 MEQ PO TBCR
40.0000 meq | EXTENDED_RELEASE_TABLET | Freq: Once | ORAL | Status: AC
  Administered 2016-08-11: 40 meq via ORAL
  Filled 2016-08-11: qty 2

## 2016-08-11 MED ORDER — ASPIRIN 81 MG PO CHEW
324.0000 mg | CHEWABLE_TABLET | Freq: Once | ORAL | Status: DC
  Filled 2016-08-11: qty 4

## 2016-08-11 MED ORDER — MIRTAZAPINE 15 MG PO TABS
30.0000 mg | ORAL_TABLET | Freq: Every day | ORAL | Status: DC
  Administered 2016-08-11 – 2016-08-13 (×3): 30 mg via ORAL
  Filled 2016-08-11 (×3): qty 2

## 2016-08-11 MED ORDER — PERFLUTREN LIPID MICROSPHERE
1.0000 mL | INTRAVENOUS | Status: AC | PRN
  Administered 2016-08-11: 2 mL via INTRAVENOUS
  Filled 2016-08-11: qty 10

## 2016-08-11 MED ORDER — OMEGA-3-ACID ETHYL ESTERS 1 G PO CAPS
1.0000 | ORAL_CAPSULE | Freq: Two times a day (BID) | ORAL | Status: DC
  Administered 2016-08-11 – 2016-08-13 (×6): 1 g via ORAL
  Filled 2016-08-11 (×6): qty 1

## 2016-08-11 MED ORDER — ACETAMINOPHEN 500 MG PO TABS: 1000.0000 mg | ORAL_TABLET | Freq: Two times a day (BID) | ORAL | Status: DC | PRN

## 2016-08-11 MED ORDER — CLOPIDOGREL BISULFATE 75 MG PO TABS
75.0000 mg | ORAL_TABLET | Freq: Every day | ORAL | Status: DC
  Administered 2016-08-11 – 2016-08-13 (×3): 75 mg via ORAL
  Filled 2016-08-11 (×3): qty 1

## 2016-08-11 MED ORDER — STROKE: EARLY STAGES OF RECOVERY BOOK
Freq: Once | Status: AC
  Administered 2016-08-11: 06:00:00
  Filled 2016-08-11: qty 1

## 2016-08-11 MED ORDER — PRAVASTATIN SODIUM 20 MG PO TABS
20.0000 mg | ORAL_TABLET | Freq: Every day | ORAL | Status: DC
  Filled 2016-08-11: qty 1

## 2016-08-11 NOTE — Progress Notes (Addendum)
CSW received a call from pt's RN stating pt's sister wanted to discuss the need for a notary.  CSW spoke to pt's sister Laverta Baltimore by phone at ph: [phone] who requested a chaplain be called to complete a "Power-of-attorney" on behalf of the pt tho assist the pt's daughter Laverta Baltimore and Francine Graven sister to be given power-of-attorney.    Pt's daughter specifically requested pt's son Loany Neuroth not be told of the power-of-attorney process being completed due to the pt's son's efforts to cancel the pt's insurance due to the pt's son's mistaken belief that canceling the pt's insurance will automatically qualify the pt to be eligible for Medicaid, per the pt's daughter Laverta Baltimore at ph: [phone].   CSW called pt's RN and left message with Secretary stating a consult for Chaplain needs to be placed, that the Chaplain completed P.O.A.  CSW paged the Chaplain and has not received a call back.  A Chaplain may not be available until 4/2 due to the holiday.  CSW will update pt's sister.  Dorothe Pea. Shawon Denzer, Theresia Majors, LCAS Clinical Social Worker Ph: [phone]

## 2016-08-11 NOTE — Evaluation (Signed)
Physical Therapy Evaluation Patient Details Name: Jody Porter MRN: [id number] DOB: 1935-07-30 Today's Date: 08/11/2016   History of Present Illness  Patient is a 81 yo female admitted 08/10/16 abdominal pain and panic attack.  Patient also with decrease in speech function.  MRI negative.   PMH:  anxiety, depression HTN, memory deficits, obesity, arthritis, obesity, Rt TKA, low back pain.  Clinical Impression  Patient presents with problems listed below.  Will benefit from acute PT to maximize functional mobility prior to return home. Patient with continued pain in Rt knee, decreased strength, and decreased balance impacting mobility, gait, and safety.  Recommend HHPT at d/c for continued therapy.    Follow Up Recommendations Home health PT;Supervision for mobility/OOB    Equipment Recommendations  None recommended by PT    Recommendations for Other Services       Precautions / Restrictions Precautions Precautions: Fall Restrictions Weight Bearing Restrictions: No      Mobility  Bed Mobility Overal bed mobility: Needs Assistance Bed Mobility: Supine to Sit;Sit to Supine     Supine to sit: Modified independent (Device/Increase time) Sit to supine: Mod assist   General bed mobility comments: Patient able to move to EOB with no assistance needed.  Good sitting balance once upright.  Patient attempted to lie down toward her back and unable to get LE's onto bed.  Instructed patient on moving to sidelying and then rolling to back.  Attempted x 4, with patient starting correctly, but then moves toward back, requiring mod assist to bring LE's onto bed.   Patient has bed on floor which may make this transition easier for her.  Transfers Overall transfer level: Needs assistance Equipment used: Rolling walker (2 wheeled) Transfers: Sit to/from Stand Sit to Stand: Min guard         General transfer comment: Verbal cues for hand placement.  Patient able to move to standing with  increased time.  Assist for safety.  Ambulation/Gait Ambulation/Gait assistance: Min guard Ambulation Distance (Feet): 96 Feet Assistive device: Rolling walker (2 wheeled) Gait Pattern/deviations: Step-through pattern;Decreased stance time - right;Decreased step length - left;Decreased step length - right;Decreased stride length;Decreased weight shift to right;Antalgic Gait velocity: decreased Gait velocity interpretation: Below normal speed for age/gender General Gait Details: Patient using RW correctly.  Patient with slow, antalgic gait pattern due to pain in Rt knee.  No buckling of Rt knee noted during gait.  Stairs            Wheelchair Mobility    Modified Rankin (Stroke Patients Only) Modified Rankin (Stroke Patients Only) Pre-Morbid Rankin Score: Slight disability Modified Rankin: Moderately severe disability     Balance Overall balance assessment: Needs assistance;History of Falls Sitting-balance support: No upper extremity supported;Feet supported Sitting balance-Leahy Scale: Good     Standing balance support: Bilateral upper extremity supported Standing balance-Leahy Scale: Poor                               Pertinent Vitals/Pain Pain Assessment: 0-10 Pain Score: 6  Pain Location: Rt knee Pain Descriptors / Indicators: Aching;Sore Pain Intervention(s): Monitored during session;Repositioned    Home Living Family/patient expects to be discharged to:: Private residence Living Arrangements: Children (Son) Available Help at Discharge: Family;Available PRN/intermittently Type of Home: House Home Access: Stairs to enter Entrance Stairs-Rails: None Entrance Stairs-Number of Steps: 3 Home Layout: Multi-level Home Equipment: Walker - 4 wheels;Walker - 2 wheels;Shower seat;Wheelchair - manual;Cane - single point  Prior Function Level of Independence: Independent with assistive device(s);Needs assistance   Gait / Transfers Assistance Needed:  Uses RW or cane for ambulation since September 2017  ADL's / Homemaking Assistance Needed: Son prepares most meal and does housekeeping.  Patient able to complete own bathing/dressing        Hand Dominance        Extremity/Trunk Assessment   Upper Extremity Assessment Upper Extremity Assessment: Overall WFL for tasks assessed    Lower Extremity Assessment Lower Extremity Assessment: Generalized weakness;RLE deficits/detail RLE Deficits / Details: Surgeries on Rt knee.  Pain with movement of knee.  Strength 4-/5 knee extension RLE: Unable to fully assess due to pain       Communication   Communication: No difficulties  Cognition Arousal/Alertness: Awake/alert Behavior During Therapy: WFL for tasks assessed/performed Overall Cognitive Status: Within Functional Limits for tasks assessed                                        General Comments      Exercises     Assessment/Plan    PT Assessment Patient needs continued PT services  PT Problem List Decreased strength;Decreased activity tolerance;Decreased range of motion;Decreased balance;Decreased mobility;Obesity;Pain       PT Treatment Interventions DME instruction;Gait training;Stair training;Functional mobility training;Therapeutic activities;Therapeutic exercise;Patient/family education    PT Goals (Current goals can be found in the Care Plan section)  Acute Rehab PT Goals Patient Stated Goal: to go home PT Goal Formulation: With patient/family Time For Goal Achievement: 08/18/16 Potential to Achieve Goals: Good    Frequency Min 3X/week   Barriers to discharge Decreased caregiver support Does not have 24 hour assist    Co-evaluation               End of Session Equipment Utilized During Treatment: Gait belt Activity Tolerance: Patient limited by pain Patient left: in bed;with call bell/phone within reach;with bed alarm set;with family/visitor present Nurse Communication: Mobility  status PT Visit Diagnosis: Unsteadiness on feet (R26.81);Difficulty in walking, not elsewhere classified (R26.2);History of falling (Z91.81);Muscle weakness (generalized) (M62.81)    Time: 1610-9604 PT Time Calculation (min) (ACUTE ONLY): 27 min   Charges:   PT Evaluation $PT Eval Moderate Complexity: 1 Procedure PT Treatments $Gait Training: 8-22 mins   PT G Codes:   PT G-Codes **NOT FOR INPATIENT CLASS** Functional Assessment Tool Used: AM-PAC 6 Clicks Basic Mobility;Clinical judgement Functional Limitation: Mobility: Walking and moving around Mobility: Walking and Moving Around Current Status (V4098): At least 20 percent but less than 40 percent impaired, limited or restricted Mobility: Walking and Moving Around Goal Status [phone]): At least 1 percent but less than 20 percent impaired, limited or restricted    Durenda Hurt. Renaldo Fiddler, [facility] Acute Rehab Services Pager [phone]   Vena Austria 08/11/2016, 5:20 PM

## 2016-08-11 NOTE — Consult Note (Signed)
Referring Physician: Dr. Allyson Sabal    Chief Complaint: "Trouble getting the words out"  HPI: Jody Porter is an 81 y.o. female who initially presented to [facility] on Saturday evening with abdominal pain and panic attack. In triage, she was "very anxious" and hyperventilating. Later, she endorsed having had difficulty speaking since Friday at 5:15 PM. Her son stated that she had been incoherent. CT head at [facility] was negative for acute abnormality. She was transferred to [facility] for stroke/TIA work up.   EKG shows no atrial fibrillation.   Her PMHx includes dementia, anxiety, HTN, hypothyroidism, prediabetes and urinary incontinence.    Past Medical History:  Diagnosis Date  . Allergic rhinitis   . Anxiety   . Celiac disease    Patient denies  . Degenerative arthritis   . Depression   . GERD (gastroesophageal reflux disease)   . Hyperparathyroidism   . Hypertension   . Hypothyroid   . Insomnia    w/ sleep apnea NPSG 07/13/2009 AHI 3.5, RDI 29.9/hr  . LBP (low back pain)   . Macular edema    OD  . Memory difficulties 01/27/2014  . Obesity   . Prediabetes   . Urinary incontinence     Past Surgical History:  Procedure Laterality Date  . carpel tunnel Bilateral   . cataracts    . CESAREAN SECTION    . CHOLECYSTECTOMY    . I&D EXTREMITY Left 07/19/2016   Procedure: IRRIGATION AND DEBRIDEMENT LEFT LONG FINGER;  Surgeon: Leanora Cover, MD;  Location: [facility] ORS;  Service: Orthopedics;  Laterality: Left;  . KNEE SURGERY Right    reports multiple surgery on knee  . LUMBAR DISC SURGERY    . PARATHYROIDECTOMY      Family History  Problem Relation Age of Onset  . Cancer Mother   . Seizures Mother     grand mal  . Cancer Father     prostate  . Arthritis/Rheumatoid Sister   . Cancer Paternal Grandmother   . Cancer Maternal Grandmother    Social History:  reports that she has never smoked. She has never used smokeless tobacco. She reports that she does not drink alcohol or use  drugs.  Allergies:  Allergies  Allergen Reactions  . Aricept [Donepezil Hcl] Other (See Comments)    Nightmares  . Sudafed [Pseudoephedrine Hcl] Other (See Comments)    Hyperactive and agitated Hyperactive and agitated, congestion   . Sugar-Protein-Starch Other (See Comments)    Loses voice, congestion  . Ciprofloxacin Other (See Comments)    Stomach pains  . Prednisone Other (See Comments)    Marked mentation/ mood change  . Valium [Diazepam] Other (See Comments)    Altered mental status changes-significant problems  . Hydroxyzine Other (See Comments)    Hallucinations   . Other     PT IS OF [place]. NO BLOOD PRODUCTS.   Marland Kitchen Acyclovir Rash  . Penicillins Itching and Rash    Has patient had a PCN reaction causing immediate rash, facial/tongue/throat swelling, SOB or lightheadedness with hypotension: Yes Has patient had a PCN reaction causing severe rash involving mucus membranes or skin necrosis: No Has patient had a PCN reaction that required hospitalization No Has patient had a PCN reaction occurring within the last 10 years: No If all of the above answers are "NO", then may proceed with Cephalosporin use.     Home Medications:  acetaminophen (TYLENOL) 500 MG tablet Take 1,000 mg by mouth 2 (two) times daily as needed  for mild pain. Jani Gravel, MD Reordered  Orderedas:acetaminophen (TYLENOL) tablet 1,000 mg - 1,000 mg, Oral, 2 times daily PRN, mild pain, Starting Sun 08/11/16 at 0326  doxycycline (VIBRAMYCIN) 100 MG capsule Take 1 capsule (100 mg total) by mouth 2 (two) times daily. Jani Gravel, MD Not Ordered  DULoxetine (CYMBALTA) 30 MG capsule Take 1 capsule by mouth daily. Jani Gravel, MD Reordered  Orderedas:DULoxetine [facility]) DR capsule 30 mg - 30 mg, Oral, Daily, First dose on Sun 08/11/16 at 1000  esomeprazole (NEXIUM) 20 MG capsule Take 20 mg by mouth daily at 12 noon. Jani Gravel, MD Reordered  Orderedas:pantoprazole (PROTONIX) EC tablet 40 mg - 40 mg,  Oral, Daily, First dose on Sun 08/11/16 at 1000  HYDROcodone-acetaminophen (NORCO/VICODIN) 5-325 MG tablet Take 1-2 tablets by mouth every 8 (eight) hours as needed for severe pain.  Jani Gravel, MD Reordered  Orderedas:HYDROcodone-acetaminophen (NORCO/VICODIN) 5-325 MG per tablet 1-2 tablet - 1-2 tablet, Oral, Every 8 hours PRN, severe pain, Starting Sun 08/11/16 at 0326  ibuprofen (ADVIL,MOTRIN) 200 MG tablet Take 1 tablet by mouth 2 (two) times daily as needed for pain. Jani Gravel, MD Not Ordered  levothyroxine (SYNTHROID, LEVOTHROID) 112 MCG tablet TAKE 1 TABLET (112 MCG TOTAL) BY MOUTH DAILY. Jani Gravel, MD Reordered  Orderedas:levothyroxine (SYNTHROID, LEVOTHROID) tablet 112 mcg - 112 mcg, Oral, Daily before breakfast, First dose on Sun 08/11/16 at 0800  LORazepam (ATIVAN) 0.5 MG tablet Take 0.5 mg by mouth 3 (three) times daily.  Jani Gravel, MD Reordered  Orderedas:LORazepam (ATIVAN) tablet 0.5 mg - 0.5 mg, Oral, 3 times daily, First dose on Sun 08/11/16 at 1000  memantine (NAMENDA) 10 MG tablet Take 10 mg by mouth 2 (two) times daily. Jani Gravel, MD Reordered  Orderedas:memantine [facility]) tablet 10 mg - 10 mg, Oral, 2 times daily, First dose on Sun 08/11/16 at 1000  metFORMIN (GLUCOPHAGE) 500 MG tablet Take 500 mg by mouth daily. Jani Gravel, MD Reordered  Orderedas:metFORMIN (GLUCOPHAGE) tablet 500 mg - 500 mg, Oral, Daily with breakfast, First dose on Sun 08/11/16 at 0800  mirtazapine (REMERON) 30 MG tablet Take 30 mg by mouth at bedtime. Jani Gravel, MD Reordered  Orderedas:mirtazapine (REMERON) tablet 30 mg - 30 mg, Oral, Daily at bedtime, First dose on Sun 08/11/16 at 2200  Multiple Vitamins-Minerals (HAIR SKIN AND NAILS FORMULA) TABS Take 1 tablet by mouth daily. Jani Gravel, MD Reordered  Orderedas:multivitamin with minerals tablet 1 tablet - 1 tablet, Oral, Daily, First dose on Sun 08/11/16 at 1000  omega-3 acid ethyl esters (LOVAZA) 1 g capsule Take 1 capsule by mouth 2 (two) times daily. Jani Gravel, MD Reordered  Orderedas:omega-3 acid ethyl esters (LOVAZA) capsule 1 g - 1 g (1 capsule), Oral, 2 times daily, First dose on Sun 08/11/16 at 1000  oxybutynin (DITROPAN-XL) 5 MG 24 hr tablet Take 1 tablet by mouth daily. Jani Gravel, MD Reordered  Orderedas:oxybutynin [facility]) 24 hr tablet 5 mg - 5 mg, Oral, Daily, First dose on Sun 08/11/16 at 1000  pravastatin (PRAVACHOL) 20 MG tablet Take 20 mg by mouth daily. Jani Gravel, MD Reordered  Orderedas:pravastatin (PRAVACHOL) tablet 20 mg - 20 mg, Oral, Daily, First dose on Sun 08/11/16 at 1000  QUEtiapine (SEROQUEL) 25 MG tablet Take 25 mg by mouth at bedtime. Jani Gravel, MD Reordered  Orderedas:QUEtiapine (SEROQUEL) tablet 25 mg - 25 mg, Oral, Daily at bedtime, First dose on Sun 08/11/16 at 2200    ROS: No vision changes, confusion, sensory numbness, limb weakness, dysarthria, chest  pain, abdominal pain or limb pain. Other ROS as per HPI.   Physical Examination: Blood pressure 136/61, pulse 85, temperature 98.5 F (36.9 C), temperature source Oral, resp. rate 18, height '4\' 9"'$  (1.448 m), weight 90 kg (198 lb 6.4 oz), SpO2 95 %.  HEENT: [place]/AT Lungs: Respirations unlabored Ext: Warm and well-perfused  Neurologic Examination: Mental Status: Alert. Oriented to month, year, date, location, city and state, but not Sunday. Thought content appropriate. Mildly anxious affect. Speech fluent with intact comprehension, naming and repetition. No dysarthria. Cranial Nerves: II:  Visual fields intact, PERRL III,IV, VI: ptosis not present, EOMI without nystagmus V,VII: smile symmetric, facial temp sensation normal bilaterally VIII: hearing intact to conversation IX,X: no hypophonia XI: symmetric XII: midline tongue extension Motor: Right : Upper extremity   5/5      Lower extremity   4/5 in the context of knee pain limiting mobility   Left:     Upper extremity   5/5  Lower extremity   5/5 Normal tone throughout; no atrophy noted Sensory:  Temperature and light touch intact x 4, without extinction Deep Tendon Reflexes:  2+ biceps and brachioradialis bilaterally 0 right patella, 1+ left patella 1+ right achilles, 0 left achilles Plantars: Right: downgoing   Left: downgoing Cerebellar: No ataxia with FNF bilaterally Gait: Deferred   Results for orders placed or performed during the hospital encounter of 08/10/16 (from the past 48 hour(s))  Lipase, blood     Status: None   Collection Time: 08/10/16  9:27 PM  Result Value Ref Range   Lipase 21 11 - 51 U/L  Comprehensive metabolic panel     Status: Abnormal   Collection Time: 08/10/16  9:27 PM  Result Value Ref Range   Sodium 140 135 - 145 mmol/L   Potassium 3.5 3.5 - 5.1 mmol/L   Chloride 106 101 - 111 mmol/L   CO2 19 (L) 22 - 32 mmol/L   Glucose, Bld 156 (H) 65 - 99 mg/dL   BUN 16 6 - 20 mg/dL   Creatinine, Ser 0.98 0.44 - 1.00 mg/dL   Calcium 10.6 (H) 8.9 - 10.3 mg/dL   Total Protein 7.4 6.5 - 8.1 g/dL   Albumin 4.3 3.5 - 5.0 g/dL   AST 29 15 - 41 U/L   ALT 17 14 - 54 U/L   Alkaline Phosphatase 106 38 - 126 U/L   Total Bilirubin 0.8 0.3 - 1.2 mg/dL   GFR calc non Af Amer 54 (L) >60 mL/min   GFR calc Af Amer >60 >60 mL/min    Comment: (NOTE) The eGFR has been calculated using the CKD EPI equation. This calculation has not been validated in all clinical situations. eGFR's persistently <60 mL/min signify possible Chronic Kidney Disease.    Anion gap 15 5 - 15  CBC     Status: Abnormal   Collection Time: 08/10/16  9:27 PM  Result Value Ref Range   WBC 8.7 4.0 - 10.5 K/uL   RBC 4.33 3.87 - 5.11 MIL/uL   Hemoglobin 11.9 (L) 12.0 - 15.0 g/dL   HCT 36.7 36.0 - 46.0 %   MCV 84.8 78.0 - 100.0 fL   MCH 27.5 26.0 - 34.0 pg   MCHC 32.4 30.0 - 36.0 g/dL   RDW 15.2 11.5 - 15.5 %   Platelets 222 150 - 400 K/uL  Lipid panel     Status: Abnormal   Collection Time: 08/11/16  3:50 AM  Result Value Ref Range   Cholesterol 187  0 - 200 mg/dL   Triglycerides 194 (H) <150  mg/dL   HDL 58 >40 mg/dL   Total CHOL/HDL Ratio 3.2 RATIO   VLDL 39 0 - 40 mg/dL   LDL Cholesterol 90 0 - 99 mg/dL    Comment:        Total Cholesterol/HDL:CHD Risk Coronary Heart Disease Risk Table                     Men   Women  1/2 Average Risk   3.4   3.3  Average Risk       5.0   4.4  2 X Average Risk   9.6   7.1  3 X Average Risk  23.4   11.0        Use the calculated Patient Ratio above and the CHD Risk Table to determine the patient's CHD Risk.        ATP III CLASSIFICATION (LDL):  <100     mg/dL   Optimal  100-129  mg/dL   Near or Above                    Optimal  130-159  mg/dL   Borderline  160-189  mg/dL   High  >190     mg/dL   Very High   Comprehensive metabolic panel     Status: Abnormal   Collection Time: 08/11/16  3:51 AM  Result Value Ref Range   Sodium 136 135 - 145 mmol/L   Potassium 3.1 (L) 3.5 - 5.1 mmol/L   Chloride 100 (L) 101 - 111 mmol/L   CO2 22 22 - 32 mmol/L   Glucose, Bld 181 (H) 65 - 99 mg/dL   BUN 15 6 - 20 mg/dL   Creatinine, Ser 1.09 (H) 0.44 - 1.00 mg/dL   Calcium 10.2 8.9 - 10.3 mg/dL   Total Protein 6.2 (L) 6.5 - 8.1 g/dL   Albumin 3.8 3.5 - 5.0 g/dL   AST 28 15 - 41 U/L   ALT 15 14 - 54 U/L   Alkaline Phosphatase 94 38 - 126 U/L   Total Bilirubin 0.9 0.3 - 1.2 mg/dL   GFR calc non Af Amer 47 (L) >60 mL/min   GFR calc Af Amer 55 (L) >60 mL/min    Comment: (NOTE) The eGFR has been calculated using the CKD EPI equation. This calculation has not been validated in all clinical situations. eGFR's persistently <60 mL/min signify possible Chronic Kidney Disease.    Anion gap 14 5 - 15  Urinalysis, Routine w reflex microscopic     Status: Abnormal   Collection Time: 08/11/16  4:15 AM  Result Value Ref Range   Color, Urine YELLOW YELLOW   APPearance CLEAR CLEAR   Specific Gravity, Urine 1.015 1.005 - 1.030   pH 8.0 5.0 - 8.0   Glucose, UA NEGATIVE NEGATIVE mg/dL   Hgb urine dipstick NEGATIVE NEGATIVE   Bilirubin Urine NEGATIVE  NEGATIVE   Ketones, ur NEGATIVE NEGATIVE mg/dL   Protein, ur 30 (A) NEGATIVE mg/dL   Nitrite NEGATIVE NEGATIVE   Leukocytes, UA NEGATIVE NEGATIVE   RBC / HPF 0-5 0 - 5 RBC/hpf   WBC, UA 0-5 0 - 5 WBC/hpf   Bacteria, UA RARE (A) NONE SEEN   Squamous Epithelial / LPF 0-5 (A) NONE SEEN   Mucous PRESENT    Hyaline Casts, UA PRESENT    Dg Chest 2 View  Result Date: 08/11/2016 CLINICAL DATA:  Acute  onset of worsening generalized abdominal pain. Initial encounter. EXAM: CHEST  2 VIEW COMPARISON:  Chest radiograph performed 04/01/2016 FINDINGS: The lungs are well-aerated and clear. There is no evidence of focal opacification, pleural effusion or pneumothorax. The heart is normal in size; the mediastinal contour is within normal limits. No acute osseous abnormalities are seen. IMPRESSION: No acute cardiopulmonary process seen. Electronically Signed   By: Garald Balding M.D.   On: 08/11/2016 02:29   Dg Abd 1 View  Result Date: 08/11/2016 CLINICAL DATA:  Acute onset of worsening generalized abdominal pain. Initial encounter. EXAM: ABDOMEN - 1 VIEW COMPARISON:  Abdominal radiograph performed 02/28/2016 FINDINGS: The visualized bowel gas pattern is unremarkable. Scattered air and stool filled loops of colon are seen; no abnormal dilatation of small bowel loops is seen to suggest small bowel obstruction. No free intra-abdominal air is identified, though evaluation for free air is limited on a single supine view. Mild degenerative change is noted along the lumbar spine; the sacroiliac joints are unremarkable in appearance. Diffuse joint space narrowing is noted at the right hip. The visualized lung bases are essentially clear. IMPRESSION: Unremarkable bowel gas pattern; no free intra-abdominal air seen. Small amount of stool noted in the colon. Electronically Signed   By: Garald Balding M.D.   On: 08/11/2016 02:30   Ct Head Wo Contrast  Result Date: 08/11/2016 CLINICAL DATA:  Expressive aphasia. EXAM: CT HEAD  WITHOUT CONTRAST TECHNIQUE: Contiguous axial images were obtained from the base of the skull through the vertex without intravenous contrast. COMPARISON:  Head CT 10/17/2015 FINDINGS: Brain: No evidence of acute infarction, hemorrhage, hydrocephalus, extra-axial collection or mass lesion/mass effect. Age related atrophy and mild chronic small vessel ischemia, stable from prior exam. Vascular: No hyperdense vessel or unexpected calcification. Skull: Stable scleroses in the left frontal bone. No acute abnormality. Sinuses/Orbits: Paranasal sinuses and mastoid air cells are clear. The visualized orbits are unremarkable. Other: Bilateral cataract resection. IMPRESSION: No acute intracranial abnormality. Electronically Signed   By: Jeb Levering M.D.   On: 08/11/2016 00:04    Assessment: 81 y.o. female with aphasia, now resolved 1. CT head shows no acute abnormality. Mild age related atrophy and chronic small vessel ischemic changes are noted. 2. Exam shows no aphasia and no lateralized motor deficit (RLE compromised by pain).  3. DDx includes AMS, aphasia secondary to TIA and anxiety-related symptoms 4. Stroke Risk Factors - HTN  Plan: 1. HgbA1c, fasting lipid panel 2. MRI and MRA of the brain without contrast 3. Carotid dopplers 4. Echocardiogram 5. ASA 81 mg po qd 6. Given advanced age, benefits of a statin most likely are outweighed by risks 7. PRN BP management 8. Telemetry monitoring 9. Frequent neuro checks 10. PT consult, OT consult, Speech consult  '@Electronically'$  signed: Dr. Kerney Elbe  08/11/2016, 6:27 AM

## 2016-08-11 NOTE — Progress Notes (Signed)
STROKE TEAM PROGRESS NOTE   HISTORY OF PRESENT ILLNESS (per record) Jody Porter is an 81 y.o. female who initially presented to [facility] on Saturday evening with abdominal pain and panic attack. In triage, she was "very anxious" and hyperventilating. Later, she endorsed having had difficulty speaking since Friday at 5:15 PM. Her son stated that she had been incoherent. CT head at [facility] was negative for acute abnormality. She was transferred to [facility] for stroke/TIA work up.   EKG shows no atrial fibrillation.   Her PMHx includes dementia, anxiety, HTN, hypothyroidism, prediabetes and urinary incontinence.     SUBJECTIVE (INTERVAL HISTORY) The patient's daughter was at bedside. Dr. Pearlean Brownie discussed the patient's case as well as the planned work up. MRI pending.   OBJECTIVE Temp:  [98.2 F (36.8 C)-98.6 F (37 C)] 98.5 F (36.9 C) (04/01 0700) Pulse Rate:  [75-96] 83 (04/01 0700) Cardiac Rhythm: Normal sinus rhythm (04/01 0822) Resp:  [16-29] 16 (04/01 0700) BP: (109-151)/(61-118) 147/63 (04/01 0700) SpO2:  [94 %-100 %] 94 % (04/01 0700) Weight:  [90 kg (198 lb 6.4 oz)] 90 kg (198 lb 6.4 oz) (04/01 0331)  CBC:   Recent Labs Lab 08/10/16 2127  WBC 8.7  HGB 11.9*  HCT 36.7  MCV 84.8  PLT 222    Basic Metabolic Panel:   Recent Labs Lab 08/10/16 2127 08/11/16 0351  NA 140 136  K 3.5 3.1*  CL 106 100*  CO2 19* 22  GLUCOSE 156* 181*  BUN 16 15  CREATININE 0.98 1.09*  CALCIUM 10.6* 10.2    Lipid Panel:     Component Value Date/Time   CHOL 187 08/11/2016 0350   TRIG 194 (H) 08/11/2016 0350   HDL 58 08/11/2016 0350   CHOLHDL 3.2 08/11/2016 0350   VLDL 39 08/11/2016 0350   LDLCALC 90 08/11/2016 0350   HgbA1c:  Lab Results  Component Value Date   HGBA1C 6.5 (H) 07/20/2016   Urine Drug Screen:     Component Value Date/Time   LABOPIA POSITIVE (A) 04/01/2016 1546   COCAINSCRNUR NONE DETECTED 04/01/2016 1546   LABBENZ POSITIVE (A) 04/01/2016  1546   AMPHETMU NONE DETECTED 04/01/2016 1546   THCU NONE DETECTED 04/01/2016 1546   LABBARB NONE DETECTED 04/01/2016 1546      IMAGING  Dg Chest 2 View 08/11/2016 No acute cardiopulmonary process seen.  Dg Abd 1 View 08/11/2016 Unremarkable bowel gas pattern; no free intra-abdominal air seen. Small amount of stool noted in the colon.    Ct Head Wo Contrast 08/11/2016 No acute intracranial abnormality.   MR MRA Head / Neck  08/11/2016 Negative for acute infarct. Mild chronic microvascular ischemic changes in the white matter Decreased signal in left MCA branches which could be due to stenosis or artifact. Image quality is degraded by motion.   PHYSICAL EXAM Pleasant frail elderly Caucasian lady not in distress. . Afebrile. Head is nontraumatic. Neck is supple without bruit.    Cardiac exam no murmur or gallop. Lungs are clear to auscultation. Distal pulses are well felt.  Neurological Exam ;  Awake  Alert oriented x 3. Normal speech and language.eye movements full without nystagmus.fundi were not visualized. Vision acuity and fields appear normal. Hearing is normal. Palatal movements are normal. Face symmetric. Tongue midline. Normal strength, tone, reflexes and coordination. Normal sensation. Gait deferred.     ASSESSMENT/PLAN Ms. Jody Porter is a 81 y.o. female with history of dementia, depression, prediabetes, obesity, hypothyroidism, hypertension, and anxiety presenting with  speech difficulties, abdominal pain, panic attack, and altered mental status. She did not receive IV t-PA due to late presentation.  Possible left hemisphericTIA  :    Resultant   No deficits  MRI - Negative for acute infarct.  MRA - Decreased signal in left MCA branches which could be due to stenosis or artifact.  Carotid Doppler - pending  2D Echo - pending  LDL - 90  HgbA1c - 6.5  VTE prophylaxis - SCDs Diet Heart Room service appropriate? Yes; Fluid consistency: Thin  No  antithrombotic prior to admission, now on clopidogrel 75 mg daily  Patient counseled to be compliant with her antithrombotic medications  Ongoing aggressive stroke risk factor management  Therapy recommendations: pending  Disposition: Pending  Hypertension  Stable  Long-term BP goal normotensive  Hyperlipidemia  Home meds:  Pravachol 20 mg daily resumed in hospital  LDL 90, goal < 70  Increase Pravachol to 40 mg daily  Continue statin at discharge  Diabetes  HgbA1c 6.5, goal < 7.0  Controlled  Other Stroke Risk Factors  Advanced age  Obesity, Body mass index is 42.93 kg/m., recommend weight loss, diet and exercise as appropriate   Obstructive sleep apnea  Other Active Problems  Hypokalemia - supplemented   Hospital day # 0  Delton See PA-C [facility] Pager [phone] 08/11/2016, 12:33 PM I have personally examined this patient, reviewed notes, independently viewed imaging studies, participated in medical decision making and plan of care.ROS completed by me personally and pertinent positives fully documented  I have made any additions or clarifications directly to the above note. Agree with note above. She presented with transient expressive aphasia likely left hemispheric TIA. She remains at risk for recurrent stroke, TIA needs ongoing stroke evaluation and aggressive risk factor modification. Recommend start aspirin for stroke prevention. Long discussion with patient and daughter at the bedside and answered questions. Greater than 50% time during this 35 minute visit was spent on counseling and coordination of care about stroke risk, prevention and treatment discussion.  Delia Heady, MD Medical Director [facility] Pager: [phone] 08/11/2016 1:13 PM   To contact Stroke Continuity provider, please refer to [email or web address]. After hours, contact General Neurology

## 2016-08-11 NOTE — Progress Notes (Signed)
Echocardiogram 2D Echocardiogram with 2mL definity has been performed.  08/11/2016 1:07 PM Gertie Fey, BS, RVT, RDCS, RDMS

## 2016-08-11 NOTE — Progress Notes (Signed)
VASCULAR LAB PRELIMINARY  PRELIMINARY  PRELIMINARY  PRELIMINARY  Carotid duplex completed.    Preliminary report:  1-39% ICA plaquing. Vertebral artery flow is antegrade.   Alvaro Aungst, RVT 08/11/2016, 12:21 PM

## 2016-08-11 NOTE — ED Notes (Signed)
Reports relief from anxiety after haldol and ativan.

## 2016-08-11 NOTE — Progress Notes (Signed)
Patient seen and examined  81 y.o. female with a PMHx anxiety, depression, HTN, GERD, obesity, who presents to the Emergency Department complaining of persistent, worsening anxiousness beginning this early this morning. She notes associated shortness of breath and speech difficulty which she describes as being unable to form the words that she is thinking. Per daughter, she also has been commenting on several objects on the walls around her which have not actually been there. Per family, pt has a h/o panic attacks which her symptoms today are consistent with; however, her prior symptoms associated with this have not previously been associated with speech difficulty.Pt is currently followed by psychotherapist and she has been compliant with her anti-anxiety medications recently  Assessment and plan 1. AMS , expressive aphasia ? r/o TIA Tele MRI brain pending EEG pending Carotid ultrasound Cardiac echo pending Neurology consulted by Ed, appreciate input.  Currently on aspirin 324, LDL-90, triglycerides 194 Hemoglobin A1c pending Telemetry shows normal sinus rhythm Venous Doppler due to recent hand surgery   2. Hypercalcemia, likely slightly dehydrated, albumin okay, 4.3   3. Abdominal firmness KUB unremarkable  4. Hypertension Cont current bp medication   5. Anemia Repeat cbc in am  6. Hyperglycemia Check hga1c in am, on metformin  7. Hypothyroidism-TSH pending, continue Synthroid  8. Hypokalemia-replete  9.Depression/anxiety, stable, continued remeron  10.Bladder spasms, stable, continued oxybutynin  11. Constipation, resolved with senna and miralax

## 2016-08-11 NOTE — H&P (Signed)
TRH H&P   Patient Demographics:    Jung Ingerson, is a 81 y.o. female  MRN: [id number]   DOB - 23-Jul-1935  Admit Date - 08/10/2016  Outpatient Primary MD for the patient is Pcp Not In System,  Nadyne Coombes  Referring MD/NP/PA: Dr. Juleen China  Outpatient Specialists:   Patient coming from: home  Chief Complaint  Patient presents with  . Abdominal Pain  . Panic Attack      HPI:    Sahirah Rudell  is a 81 y.o. female, w dementia, anxiety , hypertension states that had difficulty expressing herself since yesterday 5:15pm.  Pt states that this is better now.  Per son, she was incoherent .ED physician requests that pt be admitted for TIA r/o.  For expressive aphasia vs anxiety attack.   In ED CT brain negative.  Pt noted to be mildy anemic and also to have calcium 10.6  Pt will be admitted for r/o TIA.     Review of systems:    In addition to the HPI above,  No Fever-chills, No Headache, No changes with Vision or hearing, No problems swallowing food or Liquids, No Chest pain, Cough or Shortness of Breath, No Abdominal pain, No Nausea or Vommitting, Bowel movements are regular, No Blood in stool or Urine, No dysuria, No new skin rashes or bruises, No new joints pains-aches,  No new weakness, tingling, numbness in any extremity, No recent weight gain or loss, No polyuria, polydypsia or polyphagia, No significant Mental Stressors.  A full 10 point Review of Systems was done, except as stated above, all other Review of Systems were negative.   With Past History of the following :    Past Medical History:  Diagnosis Date  . Allergic rhinitis   . Anxiety   . Celiac disease    Patient denies  . Degenerative arthritis   . Depression   . GERD (gastroesophageal reflux disease)   . Hyperparathyroidism   . Hypertension   . Hypothyroid   . Insomnia    w/ sleep  apnea NPSG 07/13/2009 AHI 3.5, RDI 29.9/hr  . LBP (low back pain)   . Macular edema    OD  . Memory difficulties 01/27/2014  . Obesity   . Prediabetes   . Urinary incontinence       Past Surgical History:  Procedure Laterality Date  . carpel tunnel Bilateral   . cataracts    . CESAREAN SECTION    . CHOLECYSTECTOMY    . I&D EXTREMITY Left 07/19/2016   Procedure: IRRIGATION AND DEBRIDEMENT LEFT LONG FINGER;  Surgeon: Betha Loa, MD;  Location: [facility] ORS;  Service: Orthopedics;  Laterality: Left;  . KNEE SURGERY Right    reports multiple surgery on knee  . LUMBAR DISC SURGERY    . PARATHYROIDECTOMY        Social History:     Social History  Substance  Use Topics  . Smoking status: Never Smoker  . Smokeless tobacco: Never Used  . Alcohol use No     Lives - at home  Mobility - walk by self     Family History :     Family History  Problem Relation Age of Onset  . Cancer Mother   . Seizures Mother     grand mal  . Cancer Father     prostate  . Arthritis/Rheumatoid Sister   . Cancer Paternal Grandmother   . Cancer Maternal Grandmother       Home Medications:   Prior to Admission medications   Medication Sig Start Date End Date Taking? Authorizing Provider  acetaminophen (TYLENOL) 500 MG tablet Take 1,000 mg by mouth 2 (two) times daily as needed for mild pain.   Yes Historical Provider, MD  doxycycline (VIBRAMYCIN) 100 MG capsule Take 1 capsule (100 mg total) by mouth 2 (two) times daily. 07/22/16  Yes Renae Fickle, MD  DULoxetine (CYMBALTA) 30 MG capsule Take 1 capsule by mouth daily. 06/20/16  Yes Historical Provider, MD  esomeprazole (NEXIUM) 20 MG capsule Take 20 mg by mouth daily at 12 noon.   Yes Historical Provider, MD  HYDROcodone-acetaminophen (NORCO/VICODIN) 5-325 MG tablet Take 1-2 tablets by mouth every 8 (eight) hours as needed for severe pain.  02/16/16  Yes Historical Provider, MD  ibuprofen (ADVIL,MOTRIN) 200 MG tablet Take 1 tablet by mouth 2 (two)  times daily as needed for pain.   Yes Historical Provider, MD  levothyroxine (SYNTHROID, LEVOTHROID) 112 MCG tablet TAKE 1 TABLET (112 MCG TOTAL) BY MOUTH DAILY. 09/19/14  Yes Sharon Seller, NP  LORazepam (ATIVAN) 0.5 MG tablet Take 0.5 mg by mouth 3 (three) times daily.  02/29/16  Yes Historical Provider, MD  memantine (NAMENDA) 10 MG tablet Take 10 mg by mouth 2 (two) times daily. 06/02/16  Yes Historical Provider, MD  metFORMIN (GLUCOPHAGE) 500 MG tablet Take 500 mg by mouth daily. 12/30/14  Yes Historical Provider, MD  mirtazapine (REMERON) 30 MG tablet Take 30 mg by mouth at bedtime.   Yes Historical Provider, MD  Multiple Vitamins-Minerals (HAIR SKIN AND NAILS FORMULA) TABS Take 1 tablet by mouth daily.   Yes Historical Provider, MD  omega-3 acid ethyl esters (LOVAZA) 1 g capsule Take 1 capsule by mouth 2 (two) times daily.   Yes Historical Provider, MD  oxybutynin (DITROPAN-XL) 5 MG 24 hr tablet Take 1 tablet by mouth daily. 06/23/16  Yes Historical Provider, MD  pravastatin (PRAVACHOL) 20 MG tablet Take 20 mg by mouth daily. 06/27/16  Yes Historical Provider, MD  QUEtiapine (SEROQUEL) 25 MG tablet Take 25 mg by mouth at bedtime.   Yes Historical Provider, MD     Allergies:     Allergies  Allergen Reactions  . Aricept [Donepezil Hcl] Other (See Comments)    Nightmares  . Sudafed [Pseudoephedrine Hcl] Other (See Comments)    Hyperactive and agitated Hyperactive and agitated, congestion   . Sugar-Protein-Starch Other (See Comments)    Loses voice, congestion  . Ciprofloxacin Other (See Comments)    Stomach pains  . Prednisone Other (See Comments)    Marked mentation/ mood change  . Valium [Diazepam] Other (See Comments)    Altered mental status changes-significant problems  . Hydroxyzine Other (See Comments)    Hallucinations   . Other     PT IS OF JEHOVAH WITNESS FAITH. NO BLOOD PRODUCTS.   Marland Kitchen Acyclovir Rash  . Penicillins Itching and Rash  Has patient had a PCN reaction  causing immediate rash, facial/tongue/throat swelling, SOB or lightheadedness with hypotension: Yes Has patient had a PCN reaction causing severe rash involving mucus membranes or skin necrosis: No Has patient had a PCN reaction that required hospitalization No Has patient had a PCN reaction occurring within the last 10 years: No If all of the above answers are "NO", then may proceed with Cephalosporin use.      Physical Exam:   Vitals  Blood pressure 117/84, pulse 87, temperature 98.2 F (36.8 C), temperature source Oral, resp. rate 18, SpO2 100 %.   1. General lying in bed in NAD,    2. Normal affect and insight, Not Suicidal or Homicidal, Awake Alert, Oriented X 3.  3. No F.N deficits, ALL C.Nerves Intact, Strength 5/5 all 4 extremities, Sensation intact all 4 extremities, Plantars down going.  4. Ears and Eyes appear Normal, Conjunctivae clear, PERRLA. Moist Oral Mucosa.  5. Supple Neck, No JVD, No cervical lymphadenopathy appriciated, No Carotid Bruits.  6. Symmetrical Chest wall movement, Good air movement bilaterally, CTAB.  7. RRR, No Gallops, Rubs or Murmurs, No Parasternal Heave.  8. Positive Bowel Sounds, Abdomen Soft, No tenderness, No organomegaly appriciated,No rebound -guarding or rigidity.  9.  No Cyanosis, Normal Skin Turgor, No Skin Rash or Bruise.  10. Good muscle tone,  joints appear normal , no effusions, Normal ROM.  11. No Palpable Lymph Nodes in Neck or Axillae  Speech fluent   Data Review:    CBC  Recent Labs Lab 08/10/16 2127  WBC 8.7  HGB 11.9*  HCT 36.7  PLT 222  MCV 84.8  MCH 27.5  MCHC 32.4  RDW 15.2   ------------------------------------------------------------------------------------------------------------------  Chemistries   Recent Labs Lab 08/10/16 2127  NA 140  K 3.5  CL 106  CO2 19*  GLUCOSE 156*  BUN 16  CREATININE 0.98  CALCIUM 10.6*  AST 29  ALT 17  ALKPHOS 106  BILITOT 0.8    ------------------------------------------------------------------------------------------------------------------ CrCl cannot be calculated (Unknown ideal weight.). ------------------------------------------------------------------------------------------------------------------ No results for input(s): TSH, T4TOTAL, T3FREE, THYROIDAB in the last 72 hours.  Invalid input(s): FREET3  Coagulation profile No results for input(s): INR, PROTIME in the last 168 hours. ------------------------------------------------------------------------------------------------------------------- No results for input(s): DDIMER in the last 72 hours. -------------------------------------------------------------------------------------------------------------------  Cardiac Enzymes No results for input(s): CKMB, TROPONINI, MYOGLOBIN in the last 168 hours.  Invalid input(s): CK ------------------------------------------------------------------------------------------------------------------ No results found for: BNP   ---------------------------------------------------------------------------------------------------------------  Urinalysis    Component Value Date/Time   COLORURINE YELLOW 04/01/2016 1546   APPEARANCEUR CLOUDY (A) 04/01/2016 1546   APPEARANCEUR Cloudy (A) 11/17/2013 1053   LABSPEC 1.025 04/01/2016 1546   PHURINE 6.0 04/01/2016 1546   GLUCOSEU NEGATIVE 04/01/2016 1546   HGBUR NEGATIVE 04/01/2016 1546   BILIRUBINUR NEGATIVE 04/01/2016 1546   BILIRUBINUR Negative 11/17/2013 1053   KETONESUR NEGATIVE 04/01/2016 1546   PROTEINUR NEGATIVE 04/01/2016 1546   UROBILINOGEN 0.2 11/10/2008 1224   NITRITE NEGATIVE 04/01/2016 1546   LEUKOCYTESUR SMALL (A) 04/01/2016 1546   LEUKOCYTESUR Negative 11/17/2013 1053    ----------------------------------------------------------------------------------------------------------------   Imaging Results:    Ct Head Wo Contrast  Result Date:  08/11/2016 CLINICAL DATA:  Expressive aphasia. EXAM: CT HEAD WITHOUT CONTRAST TECHNIQUE: Contiguous axial images were obtained from the base of the skull through the vertex without intravenous contrast. COMPARISON:  Head CT 10/17/2015 FINDINGS: Brain: No evidence of acute infarction, hemorrhage, hydrocephalus, extra-axial collection or mass lesion/mass effect. Age related atrophy and mild chronic small vessel ischemia, stable  from prior exam. Vascular: No hyperdense vessel or unexpected calcification. Skull: Stable scleroses in the left frontal bone. No acute abnormality. Sinuses/Orbits: Paranasal sinuses and mastoid air cells are clear. The visualized orbits are unremarkable. Other: Bilateral cataract resection. IMPRESSION: No acute intracranial abnormality. Electronically Signed   By: Rubye Oaks M.D.   On: 08/11/2016 00:04     Assessment & Plan:    Active Problems:   TIA (transient ischemic attack)    1. AMS , expressive aphasia ? r/o TIA Tele MRI brain EEG Carotid ultrasound Cardiac echo Neurology consulted by Ed, appreciate input.  Pt states aspirin speed up her heart rate plavix  po qday   2. Hypercalcemia Check ionized calcium If elevated, then will need further w/up Hydrate with ns iv  3. Abdominal firmness Pt is not currently in pain  4. Hypertension Cont current bp medication   5. Anemia Repeat cbc in am  6. Hyperglycemia Check hga1c in am    DVT Prophylaxis - SCDs  AM Labs Ordered, also please review Full Orders  Family Communication: Admission, patients condition and plan of care including tests being ordered have been discussed with the patient who indicate understanding and agree with the plan and Code Status.  Code Status FULL CODE  Likely DC to  home  Condition GUARDED    Consults called:   Admission status: observations  Time spent in minutes : 45 minutes   Pearson Grippe M.D on 08/11/2016 at 1:47 AM  Between 7am to 7pm - Pager -  [phone] After 7pm go to [email or web address] - password [email or web address]  [facility] - Office  [phone]

## 2016-08-12 ENCOUNTER — Observation Stay (HOSPITAL_BASED_OUTPATIENT_CLINIC_OR_DEPARTMENT_OTHER)
Admit: 2016-08-12 | Discharge: 2016-08-12 | Disposition: A | Discharge status: Home / Self Care | Payer: Medicare (Managed Care) | Attending: Internal Medicine | Admitting: Internal Medicine

## 2016-08-12 ENCOUNTER — Observation Stay (HOSPITAL_COMMUNITY): Payer: Medicare (Managed Care)

## 2016-08-12 ENCOUNTER — Observation Stay (HOSPITAL_BASED_OUTPATIENT_CLINIC_OR_DEPARTMENT_OTHER): Payer: Medicare (Managed Care)

## 2016-08-12 DIAGNOSIS — R4789 Other speech disturbances: Secondary | ICD-10-CM | POA: Diagnosis not present

## 2016-08-12 DIAGNOSIS — R109 Unspecified abdominal pain: Secondary | ICD-10-CM

## 2016-08-12 DIAGNOSIS — G450 Vertebro-basilar artery syndrome: Secondary | ICD-10-CM

## 2016-08-12 DIAGNOSIS — G459 Transient cerebral ischemic attack, unspecified: Secondary | ICD-10-CM

## 2016-08-12 DIAGNOSIS — G9340 Encephalopathy, unspecified: Secondary | ICD-10-CM

## 2016-08-12 LAB — VAS US CAROTID
LCCADSYS: 95 cm/s
LCCAPDIAS: 20 cm/s
LEFT ECA DIAS: -13 cm/s
LEFT VERTEBRAL DIAS: 13 cm/s
Left CCA dist dias: 25 cm/s
Left CCA prox sys: 120 cm/s
Left ICA dist dias: -26 cm/s
Left ICA dist sys: -90 cm/s
Left ICA prox dias: -18 cm/s
Left ICA prox sys: -65 cm/s
RCCADSYS: 65 cm/s
RCCAPDIAS: 22 cm/s
RCCAPSYS: 101 cm/s
RIGHT ECA DIAS: -18 cm/s
RIGHT VERTEBRAL DIAS: 6 cm/s

## 2016-08-12 LAB — ECHOCARDIOGRAM COMPLETE
E/e' ratio: 18.52
EWDT: 278 ms
FS: 29 % (ref 28–44)
HEIGHTINCHES: 57 in
IV/PV OW: 0.84
LA diam index: 1.89 cm/m2
LA vol A4C: 68.7 ml
LA vol index: 28.5 mL/m2
LASIZE: 37 mm
LAVOL: 55.9 mL
LEFT ATRIUM END SYS DIAM: 37 mm
LVEEAVG: 18.52
LVEEMED: 18.52
LVELAT: 6.75 cm/s
LVOT area: 2.27 cm2
LVOT diameter: 17 mm
MV Dec: 278
MV Peak grad: 6 mmHg
MVPKAVEL: 178 m/s
MVPKEVEL: 125 m/s
PV Reg vel dias: 86.9 cm/s
PW: 8.55 mm — AB (ref 0.6–1.1)
Reg peak vel: 277 cm/s
TDI e' lateral: 6.75
TDI e' medial: 4.47
TRMAXVEL: 277 cm/s
WEIGHTICAEL: 3174.4 [oz_av]

## 2016-08-12 LAB — HEMOGLOBIN A1C
HEMOGLOBIN A1C: 6.7 % — AB (ref 4.8–5.6)
MEAN PLASMA GLUCOSE: 146 mg/dL

## 2016-08-12 MED ORDER — CLOPIDOGREL BISULFATE 75 MG PO TABS: 75.0000 mg | ORAL_TABLET | Freq: Every day | ORAL | 1 refills | Status: AC

## 2016-08-12 MED ORDER — ATORVASTATIN CALCIUM 20 MG PO TABS: 20.0000 mg | ORAL_TABLET | Freq: Every day | ORAL | 2 refills | Status: DC

## 2016-08-12 MED ORDER — PRAVASTATIN SODIUM 40 MG PO TABS: 40.0000 mg | ORAL_TABLET | Freq: Every day | ORAL | 1 refills | Status: AC

## 2016-08-12 MED ORDER — POTASSIUM CHLORIDE CRYS ER 20 MEQ PO TBCR
40.0000 meq | EXTENDED_RELEASE_TABLET | Freq: Once | ORAL | Status: AC
  Administered 2016-08-12: 40 meq via ORAL
  Filled 2016-08-12: qty 2

## 2016-08-12 NOTE — Progress Notes (Signed)
**  Preliminary report by tech**  Bilateral lower extremity venous duplex completed. There is no evidence of deep or superficial vein thrombosis involving the right and left lower extremities. All visualized vessels appear patent and compressible. There is no evidence of Baker's cysts bilaterally.  08/12/16 5:18 PM Olen Cordial RVT

## 2016-08-12 NOTE — Progress Notes (Signed)
qPhysical Therapy Treatment Patient Details Name: Jody Porter MRN: [id number] DOB: January 20, 1936 Today's Date: 08/12/2016    History of Present Illness Patient is a 81 yo female admitted 08/10/16 abdominal pain and panic attack.  Patient also with decrease in speech function.  MRI negative.   PMH:  anxiety, depression HTN, memory deficits, obesity, arthritis, obesity, Rt TKA, low back pain.    PT Comments    Pt progressing slowly towards goals. Continues to be limited in ambulation tolerance secondary to RLE pain. Pt requiring from min guard to supervision assist for all mobility tasks. Current recommendations remain appropriate. Will continue to follow to maximize functional mobility independence.    Follow Up Recommendations  Home health PT;Supervision for mobility/OOB     Equipment Recommendations  None recommended by PT    Recommendations for Other Services       Precautions / Restrictions Precautions Precautions: Fall Restrictions Weight Bearing Restrictions: No    Mobility  Bed Mobility Overal bed mobility: Needs Assistance Bed Mobility: Supine to Sit     Supine to sit: Supervision;HOB elevated     General bed mobility comments: Elevated HOB and pt using bed rails for bed mobility. Supervision for safety.   Transfers Overall transfer level: Needs assistance Equipment used: Rolling walker (2 wheeled) Transfers: Sit to/from Stand Sit to Stand: Supervision         General transfer comment: Supervision for safety   Ambulation/Gait Ambulation/Gait assistance: Min guard Ambulation Distance (Feet): 75 Feet Assistive device: Rolling walker (2 wheeled) Gait Pattern/deviations: Step-through pattern;Decreased stride length;Decreased stance time - right;Decreased weight shift to right;Antalgic Gait velocity: decreased Gait velocity interpretation: Below normal speed for age/gender General Gait Details: Pt reporting increased R knee and hip pain this session which  limited ambulation tolerance. Pt reports at one point that her knee felt like it was not steady, however, did not note any buckling. Pt with slow, steady gait.    Stairs            Wheelchair Mobility    Modified Rankin (Stroke Patients Only) Modified Rankin (Stroke Patients Only) Pre-Morbid Rankin Score: Slight disability Modified Rankin: Moderately severe disability     Balance Overall balance assessment: Needs assistance;History of Falls Sitting-balance support: No upper extremity supported;Feet supported Sitting balance-Leahy Scale: Good     Standing balance support: Bilateral upper extremity supported;During functional activity Standing balance-Leahy Scale: Poor Standing balance comment: Reliant on RW for stability.                             Cognition Arousal/Alertness: Awake/alert Behavior During Therapy: WFL for tasks assessed/performed Overall Cognitive Status: Within Functional Limits for tasks assessed                                 General Comments: Pt reports feeling "weird" this morning and stated "at least I know where I am now". Pt alert and oriented X3 for this session.       Exercises      General Comments General comments (skin integrity, edema, etc.): Pts daughter present throughout session. Pt sitting at EOB with daughter present secondary to refusal of recliner. Reported she was more comfortable sitting at EOB to eat lunch. Pt's daughter reports she can assist pt into bed when she is ready. Instructed to call nurse if she needed to get up. Pt went to bathroom during session; NT  notified.       Pertinent Vitals/Pain Pain Assessment: Faces Faces Pain Scale: Hurts little more Pain Location: Rt knee Pain Descriptors / Indicators: Aching;Sore Pain Intervention(s): Limited activity within patient's tolerance;Monitored during session;Repositioned    Home Living                      Prior Function             PT Goals (current goals can now be found in the care plan section) Acute Rehab PT Goals Patient Stated Goal: to go home PT Goal Formulation: With patient/family Time For Goal Achievement: 08/18/16 Potential to Achieve Goals: Good Progress towards PT goals: Progressing toward goals    Frequency    Min 3X/week      PT Plan Current plan remains appropriate    Co-evaluation             End of Session Equipment Utilized During Treatment: Gait belt Activity Tolerance: Patient limited by pain Patient left: in bed;with call bell/phone within reach;with family/visitor present Nurse Communication: Mobility status PT Visit Diagnosis: Unsteadiness on feet (R26.81);Difficulty in walking, not elsewhere classified (R26.2);History of falling (Z91.81);Muscle weakness (generalized) (M62.81);Pain Pain - Right/Left: Right Pain - part of body: Hip;Knee     Time: 1610-9604 PT Time Calculation (min) (ACUTE ONLY): 13 min  Charges:  $Gait Training: 8-22 mins                    G Codes:  Functional Assessment Tool Used: AM-PAC 6 Clicks Basic Mobility;Clinical judgement Functional Limitation: Mobility: Walking and moving around Mobility: Walking and Moving Around Current Status (V4098): At least 20 percent but less than 40 percent impaired, limited or restricted Mobility: Walking and Moving Around Goal Status [id number]): At least 1 percent but less than 20 percent impaired, limited or restricted    Margot Chimes, PT, DPT  Acute Rehabilitation Services  Pager: [phone]  Melvyn Novas 08/12/2016, 1:50 PM

## 2016-08-12 NOTE — Procedures (Signed)
HPI:  81 y/o with MS change  TECHNICAL SUMMARY:  A multichannel referential and bipolar montage EEG using the standard international 10-20 system was performed on the patient described as awake.  The dominant background activity consists of 10-11 hertz activity seen most prominantly over the posterior head region.  The backgound activity is reactive to eye opening and closing procedures.  Low voltage fast (beta) activity is distributed symmetrically and maximally over the anterior head regions.  ACTIVATION:  Stepwise photic stimulation and hyperventilation are not performed.  EPILEPTIFORM ACTIVITY:  There were no spikes, sharp waves or paroxysmal activity.  SLEEP:  Brief stage I sleep is noted, but no stage II sleep.  CARDIAC:  The EKG lead revealed a regular sinus rhythm.  IMPRESSION:  This is a normal EEG for the patients stated age.  There were no focal, hemispheric or lateralizing features.  No epileptiform activity was recorded.  A normal EEG does not exclude the diagnosis of a seizure disorder and if seizure remains high on the list of differential diagnosis, an ambulatory EEG may be of value.  Clinical correlation is required.

## 2016-08-12 NOTE — Progress Notes (Signed)
PT Cancellation Note  Patient Details Name: NELSY MADONNA MRN: [id number] DOB: 1935-08-28   Cancelled Treatment:    Reason Eval/Treat Not Completed: Patient at procedure or test/unavailable Pt currently at EEG. Will reattempt as schedule allows.   Margot Chimes, PT, DPT  Acute Rehabilitation Services  Pager: [phone]    Melvyn Novas 08/12/2016, 9:48 AM

## 2016-08-12 NOTE — Discharge Summary (Addendum)
Physician Discharge Summary  Jody Porter MRN: [id number] DOB/AGE: 79-Oct-1937 79 y.o.  PCP: Pcp Not In System   Admit date: 08/10/2016 Discharge date: 08/12/2016  Discharge Diagnoses:    Active Problems:   TIA (transient ischemic attack)   Word finding difficulty    Follow-up recommendations Follow-up with PCP in 3-5 days , including all  additional recommended appointments as below Follow-up CBC, CMP in 3-5 days  Addendum : DC cancelled , for TEE in am      Current Discharge Medication List    START taking these medications   Details  clopidogrel (PLAVIX) 75 MG tablet Take 1 tablet (75 mg total) by mouth daily. Qty: 30 tablet, Refills: 1      CONTINUE these medications which have CHANGED   Details  pravastatin (PRAVACHOL) 40 MG tablet Take 1 tablet (40 mg total) by mouth daily. Qty: 30 tablet, Refills: 1      CONTINUE these medications which have NOT CHANGED   Details  acetaminophen (TYLENOL) 500 MG tablet Take 1,000 mg by mouth 2 (two) times daily as needed for mild pain.    doxycycline (VIBRAMYCIN) 100 MG capsule Take 1 capsule (100 mg total) by mouth 2 (two) times daily. Qty: 10 capsule, Refills: 0    DULoxetine (CYMBALTA) 30 MG capsule Take 1 capsule by mouth daily. Refills: 3    esomeprazole (NEXIUM) 20 MG capsule Take 20 mg by mouth daily at 12 noon.    HYDROcodone-acetaminophen (NORCO/VICODIN) 5-325 MG tablet Take 1-2 tablets by mouth every 8 (eight) hours as needed for severe pain.  Refills: 0    levothyroxine (SYNTHROID, LEVOTHROID) 112 MCG tablet TAKE 1 TABLET (112 MCG TOTAL) BY MOUTH DAILY. Qty: 30 tablet, Refills: 0    LORazepam (ATIVAN) 0.5 MG tablet Take 0.5 mg by mouth 3 (three) times daily.  Refills: 3    memantine (NAMENDA) 10 MG tablet Take 10 mg by mouth 2 (two) times daily. Refills: 2    metFORMIN (GLUCOPHAGE) 500 MG tablet Take 500 mg by mouth daily. Refills: 5    mirtazapine (REMERON) 30 MG tablet Take 30 mg by mouth at  bedtime.    Multiple Vitamins-Minerals (HAIR SKIN AND NAILS FORMULA) TABS Take 1 tablet by mouth daily.    omega-3 acid ethyl esters (LOVAZA) 1 g capsule Take 1 capsule by mouth 2 (two) times daily.    oxybutynin (DITROPAN-XL) 5 MG 24 hr tablet Take 1 tablet by mouth daily. Refills: 3    QUEtiapine (SEROQUEL) 25 MG tablet Take 25 mg by mouth at bedtime.      STOP taking these medications     ibuprofen (ADVIL,MOTRIN) 200 MG tablet         Discharge Condition: Stable   Discharge Instructions Get Medicines reviewed and adjusted: Please take all your medications with you for your next visit with your Primary MD  Please request your Primary MD to go over all hospital tests and procedure/radiological results at the follow up, please ask your Primary MD to get all Hospital records sent to his/her office.  If you experience worsening of your admission symptoms, develop shortness of breath, life threatening emergency, suicidal or homicidal thoughts you must seek medical attention immediately by calling 911 or calling your MD immediately if symptoms less severe.  You must read complete instructions/literature along with all the possible adverse reactions/side effects for all the Medicines you take and that have been prescribed to you. Take any new Medicines after you have completely understood and accpet all  the possible adverse reactions/side effects.   Do not drive when taking Pain medications.   Do not take more than prescribed Pain, Sleep and Anxiety Medications  Special Instructions: If you have smoked or chewed Tobacco in the last 2 yrs please stop smoking, stop any regular Alcohol and or any Recreational drug use.  Wear Seat belts while driving.  Please note  You were cared for by a hospitalist during your hospital stay. Once you are discharged, your primary care physician will handle any further medical issues. Please note that NO REFILLS for any discharge medications will  be authorized once you are discharged, as it is imperative that you return to your primary care physician (or establish a relationship with a primary care physician if you do not have one) for your aftercare needs so that they can reassess your need for medications and monitor your lab values.     Allergies  Allergen Reactions  . Aricept [Donepezil Hcl] Other (See Comments)    Nightmares  . Sudafed [Pseudoephedrine Hcl] Other (See Comments)    Hyperactive and agitated Hyperactive and agitated, congestion   . Sugar-Protein-Starch Other (See Comments)    Loses voice, congestion  . Ciprofloxacin Other (See Comments)    Stomach pains  . Prednisone Other (See Comments)    Marked mentation/ mood change  . Valium [Diazepam] Other (See Comments)    Altered mental status changes-significant problems  . Hydroxyzine Other (See Comments)    Hallucinations   . Other     PT IS OF [place]. NO BLOOD PRODUCTS.   Marland Kitchen Acyclovir Rash  . Penicillins Itching and Rash    Has patient had a PCN reaction causing immediate rash, facial/tongue/throat swelling, SOB or lightheadedness with hypotension: Yes Has patient had a PCN reaction causing severe rash involving mucus membranes or skin necrosis: No Has patient had a PCN reaction that required hospitalization No Has patient had a PCN reaction occurring within the last 10 years: No If all of the above answers are "NO", then may proceed with Cephalosporin use.       Disposition: 01-Home or Self Care   Consults: Neurology *    Significant Diagnostic Studies:  Dg Chest 2 View  Result Date: 08/11/2016 CLINICAL DATA:  Acute onset of worsening generalized abdominal pain. Initial encounter. EXAM: CHEST  2 VIEW COMPARISON:  Chest radiograph performed 04/01/2016 FINDINGS: The lungs are well-aerated and clear. There is no evidence of focal opacification, pleural effusion or pneumothorax. The heart is normal in size; the mediastinal contour is  within normal limits. No acute osseous abnormalities are seen. IMPRESSION: No acute cardiopulmonary process seen. Electronically Signed   By: Garald Balding M.D.   On: 08/11/2016 02:29   Dg Abd 1 View  Result Date: 08/11/2016 CLINICAL DATA:  Acute onset of worsening generalized abdominal pain. Initial encounter. EXAM: ABDOMEN - 1 VIEW COMPARISON:  Abdominal radiograph performed 02/28/2016 FINDINGS: The visualized bowel gas pattern is unremarkable. Scattered air and stool filled loops of colon are seen; no abnormal dilatation of small bowel loops is seen to suggest small bowel obstruction. No free intra-abdominal air is identified, though evaluation for free air is limited on a single supine view. Mild degenerative change is noted along the lumbar spine; the sacroiliac joints are unremarkable in appearance. Diffuse joint space narrowing is noted at the right hip. The visualized lung bases are essentially clear. IMPRESSION: Unremarkable bowel gas pattern; no free intra-abdominal air seen. Small amount of stool noted in the colon. Electronically  Signed   By: Garald Balding M.D.   On: 08/11/2016 02:30   Ct Head Wo Contrast  Result Date: 08/11/2016 CLINICAL DATA:  Expressive aphasia. EXAM: CT HEAD WITHOUT CONTRAST TECHNIQUE: Contiguous axial images were obtained from the base of the skull through the vertex without intravenous contrast. COMPARISON:  Head CT 10/17/2015 FINDINGS: Brain: No evidence of acute infarction, hemorrhage, hydrocephalus, extra-axial collection or mass lesion/mass effect. Age related atrophy and mild chronic small vessel ischemia, stable from prior exam. Vascular: No hyperdense vessel or unexpected calcification. Skull: Stable scleroses in the left frontal bone. No acute abnormality. Sinuses/Orbits: Paranasal sinuses and mastoid air cells are clear. The visualized orbits are unremarkable. Other: Bilateral cataract resection. IMPRESSION: No acute intracranial abnormality. Electronically Signed    By: Jeb Levering M.D.   On: 08/11/2016 00:04   Mr Brain Wo Contrast  Result Date: 08/11/2016 CLINICAL DATA:  TIA.  Expressive aphasia EXAM: MRI HEAD WITHOUT CONTRAST MRA HEAD WITHOUT CONTRAST TECHNIQUE: Multiplanar, multiecho pulse sequences of the brain and surrounding structures were obtained without intravenous contrast. Angiographic images of the head were obtained using MRA technique without contrast. COMPARISON:  CT head 08/10/2016 FINDINGS: MRI HEAD FINDINGS Brain: Negative for acute infarct. Mild chronic microvascular ischemic change in the white matter. Ventricle size normal. Negative for hemorrhage or mass. Vascular: Normal arterial flow voids. Skull and upper cervical spine: Negative Sinuses/Orbits: Negative Other: Image quality degraded by mild motion. MRA HEAD FINDINGS Internal carotid artery patent bilaterally without significant stenosis. Decreased signal in left MCA branches may be artifactual versus MCA bifurcation stenosis. Right MCA branches patent. Both anterior cerebral arteries patent. Both vertebral arteries patent to the basilar. Basilar widely patent. PICA, superior cerebellar, and posterior cerebral arteries patent bilaterally. Fetal origin right posterior cerebral artery. IMPRESSION: Negative for acute infarct. Mild chronic microvascular ischemic changes in the white matter Decreased signal in left MCA branches which could be due to stenosis or artifact. Image quality is degraded by motion. Electronically Signed   By: Franchot Gallo M.D.   On: 08/11/2016 11:33   Dg Hand Complete Left  Result Date: 07/19/2016 CLINICAL DATA:  Bloody discharge from left middle finger for 2 days. EXAM: LEFT HAND - COMPLETE 3+ VIEW COMPARISON:  None. FINDINGS: No fracture. No subluxation or dislocation. Degenerative changes are seen in scattered IP joints. Soft tissue swelling noted at the tip of the middle finger without underlying bony erosion or destruction to suggest overt osteomyelitis. No  evidence for radiopaque soft tissue foreign body. IMPRESSION: Soft tissue swelling distal aspect of middle finger without underlying bony abnormality. Electronically Signed   By: Misty Stanley M.D.   On: 07/19/2016 17:50   Mr Jodene Nam Head/brain HU Cm  Result Date: 08/11/2016 CLINICAL DATA:  TIA.  Expressive aphasia EXAM: MRI HEAD WITHOUT CONTRAST MRA HEAD WITHOUT CONTRAST TECHNIQUE: Multiplanar, multiecho pulse sequences of the brain and surrounding structures were obtained without intravenous contrast. Angiographic images of the head were obtained using MRA technique without contrast. COMPARISON:  CT head 08/10/2016 FINDINGS: MRI HEAD FINDINGS Brain: Negative for acute infarct. Mild chronic microvascular ischemic change in the white matter. Ventricle size normal. Negative for hemorrhage or mass. Vascular: Normal arterial flow voids. Skull and upper cervical spine: Negative Sinuses/Orbits: Negative Other: Image quality degraded by mild motion. MRA HEAD FINDINGS Internal carotid artery patent bilaterally without significant stenosis. Decreased signal in left MCA branches may be artifactual versus MCA bifurcation stenosis. Right MCA branches patent. Both anterior cerebral arteries patent. Both vertebral arteries patent to the  basilar. Basilar widely patent. PICA, superior cerebellar, and posterior cerebral arteries patent bilaterally. Fetal origin right posterior cerebral artery. IMPRESSION: Negative for acute infarct. Mild chronic microvascular ischemic changes in the white matter Decreased signal in left MCA branches which could be due to stenosis or artifact. Image quality is degraded by motion. Electronically Signed   By: Franchot Gallo M.D.   On: 08/11/2016 11:33    echocardiogram     Filed Weights   08/11/16 0331  Weight: 90 kg (198 lb 6.4 oz)     Microbiology: No results found for this or any previous visit (from the past 240 hour(s)).     Blood Culture    Component Value Date/Time   SDES  BLOOD RIGHT HAND 07/20/2016 0033   SDES BLOOD RIGHT ANTECUBITAL 07/20/2016 0033   SPECREQUEST IN PEDIATRIC BOTTLE 2CC 07/20/2016 0033   SPECREQUEST IN PEDIATRIC BOTTLE 2CC 07/20/2016 0033   CULT  07/20/2016 0033    NO GROWTH 5 DAYS Performed at [facility], [place] [address] [place], [place] [address]    CULT  07/20/2016 0033    NO GROWTH 5 DAYS Performed at [facility], [place] [address], [place], [place] [address]    REPTSTATUS 07/25/2016 FINAL 07/20/2016 0033   REPTSTATUS 07/25/2016 FINAL 07/20/2016 0033      Labs: Results for orders placed or performed during the hospital encounter of 08/10/16 (from the past 48 hour(s))  Lipase, blood     Status: None   Collection Time: 08/10/16  9:27 PM  Result Value Ref Range   Lipase 21 11 - 51 U/L  Comprehensive metabolic panel     Status: Abnormal   Collection Time: 08/10/16  9:27 PM  Result Value Ref Range   Sodium 140 135 - 145 mmol/L   Potassium 3.5 3.5 - 5.1 mmol/L   Chloride 106 101 - 111 mmol/L   CO2 19 (L) 22 - 32 mmol/L   Glucose, Bld 156 (H) 65 - 99 mg/dL   BUN 16 6 - 20 mg/dL   Creatinine, Ser 0.98 0.44 - 1.00 mg/dL   Calcium 10.6 (H) 8.9 - 10.3 mg/dL   Total Protein 7.4 6.5 - 8.1 g/dL   Albumin 4.3 3.5 - 5.0 g/dL   AST 29 15 - 41 U/L   ALT 17 14 - 54 U/L   Alkaline Phosphatase 106 38 - 126 U/L   Total Bilirubin 0.8 0.3 - 1.2 mg/dL   GFR calc non Af Amer 54 (L) >60 mL/min   GFR calc Af Amer >60 >60 mL/min    Comment: (NOTE) The eGFR has been calculated using the CKD EPI equation. This calculation has not been validated in all clinical situations. eGFR's persistently <60 mL/min signify possible Chronic Kidney Disease.    Anion gap 15 5 - 15  CBC     Status: Abnormal   Collection Time: 08/10/16  9:27 PM  Result Value Ref Range   WBC 8.7 4.0 - 10.5 K/uL   RBC 4.33 3.87 - 5.11 MIL/uL   Hemoglobin 11.9 (L) 12.0 - 15.0 g/dL   HCT 36.7 36.0 - 46.0 %   MCV 84.8 78.0 - 100.0 fL   MCH 27.5 26.0 - 34.0 pg    MCHC 32.4 30.0 - 36.0 g/dL   RDW 15.2 11.5 - 15.5 %   Platelets 222 150 - 400 K/uL  Hemoglobin A1c     Status: Abnormal   Collection Time: 08/11/16  3:50 AM  Result Value Ref Range  Hgb A1c MFr Bld 6.7 (H) 4.8 - 5.6 %    Comment: (NOTE)         Pre-diabetes: 5.7 - 6.4         Diabetes: >6.4         Glycemic control for adults with diabetes: <7.0    Mean Plasma Glucose 146 mg/dL    Comment: (NOTE) Performed At: [facility] [place], [place] [id number] Lindon Romp MD [id number]   Lipid panel     Status: Abnormal   Collection Time: 08/11/16  3:50 AM  Result Value Ref Range   Cholesterol 187 0 - 200 mg/dL   Triglycerides 194 (H) <150 mg/dL   HDL 58 >40 mg/dL   Total CHOL/HDL Ratio 3.2 RATIO   VLDL 39 0 - 40 mg/dL   LDL Cholesterol 90 0 - 99 mg/dL    Comment:        Total Cholesterol/HDL:CHD Risk Coronary Heart Disease Risk Table                     Men   Women  1/2 Average Risk   3.4   3.3  Average Risk       5.0   4.4  2 X Average Risk   9.6   7.1  3 X Average Risk  23.4   11.0        Use the calculated Patient Ratio above and the CHD Risk Table to determine the patient's CHD Risk.        ATP III CLASSIFICATION (LDL):  <100     mg/dL   Optimal  100-129  mg/dL   Near or Above                    Optimal  130-159  mg/dL   Borderline  160-189  mg/dL   High  >190     mg/dL   Very High   Comprehensive metabolic panel     Status: Abnormal   Collection Time: 08/11/16  3:51 AM  Result Value Ref Range   Sodium 136 135 - 145 mmol/L   Potassium 3.1 (L) 3.5 - 5.1 mmol/L   Chloride 100 (L) 101 - 111 mmol/L   CO2 22 22 - 32 mmol/L   Glucose, Bld 181 (H) 65 - 99 mg/dL   BUN 15 6 - 20 mg/dL   Creatinine, Ser 1.09 (H) 0.44 - 1.00 mg/dL   Calcium 10.2 8.9 - 10.3 mg/dL   Total Protein 6.2 (L) 6.5 - 8.1 g/dL   Albumin 3.8 3.5 - 5.0 g/dL   AST 28 15 - 41 U/L   ALT 15 14 - 54 U/L   Alkaline Phosphatase 94 38 - 126 U/L   Total Bilirubin 0.9 0.3 -  1.2 mg/dL   GFR calc non Af Amer 47 (L) >60 mL/min   GFR calc Af Amer 55 (L) >60 mL/min    Comment: (NOTE) The eGFR has been calculated using the CKD EPI equation. This calculation has not been validated in all clinical situations. eGFR's persistently <60 mL/min signify possible Chronic Kidney Disease.    Anion gap 14 5 - 15  Urinalysis, Routine w reflex microscopic     Status: Abnormal   Collection Time: 08/11/16  4:15 AM  Result Value Ref Range   Color, Urine YELLOW YELLOW   APPearance CLEAR CLEAR   Specific Gravity, Urine 1.015 1.005 - 1.030   pH 8.0 5.0 - 8.0  Glucose, UA NEGATIVE NEGATIVE mg/dL   Hgb urine dipstick NEGATIVE NEGATIVE   Bilirubin Urine NEGATIVE NEGATIVE   Ketones, ur NEGATIVE NEGATIVE mg/dL   Protein, ur 30 (A) NEGATIVE mg/dL   Nitrite NEGATIVE NEGATIVE   Leukocytes, UA NEGATIVE NEGATIVE   RBC / HPF 0-5 0 - 5 RBC/hpf   WBC, UA 0-5 0 - 5 WBC/hpf   Bacteria, UA RARE (A) NONE SEEN   Squamous Epithelial / LPF 0-5 (A) NONE SEEN   Mucous PRESENT    Hyaline Casts, UA PRESENT   D-dimer, quantitative (not at [facility])     Status: Abnormal   Collection Time: 08/11/16  2:47 PM  Result Value Ref Range   D-Dimer, Quant 2.66 (H) 0.00 - 0.50 ug/mL-FEU    Comment: (NOTE) At the manufacturer cut-off of 0.50 ug/mL FEU, this assay has been documented to exclude PE with a sensitivity and negative predictive value of 97 to 99%.  At this time, this assay has not been approved by the FDA to exclude DVT/VTE. Results should be correlated with clinical presentation.      Lipid Panel     Component Value Date/Time   CHOL 187 08/11/2016 0350   TRIG 194 (H) 08/11/2016 0350   HDL 58 08/11/2016 0350   CHOLHDL 3.2 08/11/2016 0350   VLDL 39 08/11/2016 0350   LDLCALC 90 08/11/2016 0350     Lab Results  Component Value Date   HGBA1C 6.7 (H) 08/11/2016   HGBA1C 6.5 (H) 07/20/2016   HGBA1C 6.4 (H) 12/09/2012        HPI :   81 y.o.femalewith a PMHx anxiety,  depression, HTN, GERD, obesity, who presents to the Emergency Department complaining of persistent, worsening anxiousness beginning this early this morning. She notes associated shortness of breath and speech difficulty which she describes as being unable to form the words that she is thinking. Per daughter, she also has been commenting on several objects on the walls around her which have not actually been there. Per family, pt has a h/o panic attacks which her symptoms today are consistent with; however, her prior symptoms associated with this have not previously been associated with speech difficulty.Pt is currently followed by psychotherapist and she has been compliant with her anti-anxiety medications recently.CT head at [facility] was negative for acute abnormality. She was transferred to [facility] for stroke/TIA work up.    HOSPITAL COURSE:   1. AMS , expressive aphasia ? r/o TIA Tele  MRI - Negative for acute infarct.  MRA - Decreased signal in left MCA branches which could be due to stenosis or artifact. EEG  This is a normal EEG for the patients stated age Carotid ultrasound 1-39% ICA plaquing. Vertebral artery flow is antegrade Cardiac echo pending Neurology consulted   Currently on aspirin 324, they recommend Plavix 75 mg a day LDL-90, triglycerides 194 Hemoglobin A1c 6.7 Telemetry shows normal sinus rhythm Venous Doppler due to recent surgery-abnormal d-dimer, no pulmonary symptoms, low suspicion for PE [no cardiopulmonary symptoms].   2. Hypercalcemia, likely slightly dehydrated, albumin okay, 4.3   3. Abdominal firmness KUB unremarkable  4. Hypertension Cont current bp medication   5. Anemia Repeat cbc in am. Hemoglobin 11.9 prior to discharge  6. Hyperglycemia Hemoglobin A1c 6.7, on metformin  7.Hypothyroidism-TSH pending, continue Synthroid  8. Hypokalemia-repleted  9.Depression/anxiety, stable, continuedremeron  10.Bladder spasms,stable,  continuedoxybutynin  11. Constipation,   senna and miralax  12. Obesity, Body mass index is 42.93 kg/m., recommend weight loss, diet and exercise as appropriate  Discharge Exam:   Blood pressure 138/64, pulse 82, temperature 98.6 F (37 C), temperature source Oral, resp. rate 18, height '4\' 9"'$  (1.448 m), weight 90 kg (198 lb 6.4 oz), SpO2 98 %.  Cardiovascular: Normal rate, regular rhythm and normal heart sounds.   No murmur heard. Pulmonary/Chest: Effort normal and breath sounds normal. No respiratory distress. She has no wheezes. She has no rales.  Abdominal: Soft. She exhibits no distension. There is no tenderness. There is no rebound and no guarding.  Musculoskeletal: Normal range of motion. She exhibits no edema or tenderness.  Neurological: She is alert and oriented to person, place, and time    Follow-up Information    Primary care provider. Call in 2 day(s).   Why:  To make follow-up appointment, follow with PCP in 3-5 days           Signed: [facility] 08/12/2016, 12:51 PM        Time spent >45 mins

## 2016-08-12 NOTE — Progress Notes (Signed)
Routine EEG completed, results pending. 

## 2016-08-12 NOTE — Care Management Note (Signed)
Case Management Note  Patient Details  Name: Jody Porter MRN: [id number] Date of Birth: 05/15/1935  Subjective/Objective:           Patient presented with altered mental status. Lives at home with son, who works during the day.  CM will follow for discharge needs pending therapy evals and physician orders.          Action/Plan:   Expected Discharge Date:                  Expected Discharge Plan:     In-House Referral:     Discharge planning Services     Post Acute Care Choice:    Choice offered to:     DME Arranged:    DME Agency:     HH Arranged:    HH Agency:     Status of Service:     If discussed at [employer] of Stay Meetings, dates discussed:    Additional Comments:  Anda Kraft, RN 08/12/2016, 10:35 AM

## 2016-08-12 NOTE — Progress Notes (Signed)
STROKE TEAM PROGRESS NOTE   HISTORY OF PRESENT ILLNESS (per record) Jody Porter is an 81 y.o. female who initially presented to [facility] on Saturday evening with abdominal pain and panic attack. In triage, she was "very anxious" and hyperventilating. Later, she endorsed having had difficulty speaking since Friday at 5:15 PM. Her son stated that she had been incoherent. CT head at [facility] was negative for acute abnormality. She was transferred to [facility] for stroke/TIA work up.   EKG shows no atrial fibrillation.   Her PMHx includes dementia, anxiety, HTN, hypothyroidism, prediabetes and urinary incontinence.     SUBJECTIVE (INTERVAL HISTORY) The patient's daughter was at bedside. Dr. Pearlean Brownie discussed the patient's case as well as the planned work up. MRI pending.   OBJECTIVE Temp:  [97.5 F (36.4 C)-98.6 F (37 C)] 98.6 F (37 C) (04/02 0537) Pulse Rate:  [68-86] 82 (04/02 0537) Cardiac Rhythm: Normal sinus rhythm (04/02 0732) Resp:  [18] 18 (04/02 0537) BP: (127-150)/(46-76) 138/64 (04/02 0537) SpO2:  [92 %-98 %] 98 % (04/02 0537)  CBC:   Recent Labs Lab 08/10/16 2127  WBC 8.7  HGB 11.9*  HCT 36.7  MCV 84.8  PLT 222    Basic Metabolic Panel:   Recent Labs Lab 08/10/16 2127 08/11/16 0351  NA 140 136  K 3.5 3.1*  CL 106 100*  CO2 19* 22  GLUCOSE 156* 181*  BUN 16 15  CREATININE 0.98 1.09*  CALCIUM 10.6* 10.2    Lipid Panel:     Component Value Date/Time   CHOL 187 08/11/2016 0350   TRIG 194 (H) 08/11/2016 0350   HDL 58 08/11/2016 0350   CHOLHDL 3.2 08/11/2016 0350   VLDL 39 08/11/2016 0350   LDLCALC 90 08/11/2016 0350   HgbA1c:  Lab Results  Component Value Date   HGBA1C 6.7 (H) 08/11/2016   Urine Drug Screen:     Component Value Date/Time   LABOPIA POSITIVE (A) 04/01/2016 1546   COCAINSCRNUR NONE DETECTED 04/01/2016 1546   LABBENZ POSITIVE (A) 04/01/2016 1546   AMPHETMU NONE DETECTED 04/01/2016 1546   THCU NONE DETECTED 04/01/2016  1546   LABBARB NONE DETECTED 04/01/2016 1546      IMAGING  Dg Chest 2 View 08/11/2016 No acute cardiopulmonary process seen.  Dg Abd 1 View 08/11/2016 Unremarkable bowel gas pattern; no free intra-abdominal air seen. Small amount of stool noted in the colon.    Ct Head Wo Contrast 08/11/2016 No acute intracranial abnormality.   MR MRA Head / Neck  08/11/2016 Negative for acute infarct. Mild chronic microvascular ischemic changes in the white matter Decreased signal in left MCA branches which could be due to stenosis or artifact. Image quality is degraded by motion.   PHYSICAL EXAM Pleasant frail elderly Caucasian lady not in distress. . Afebrile. Head is nontraumatic. Neck is supple without bruit.    Cardiac exam no murmur or gallop. Lungs are clear to auscultation. Distal pulses are well felt.  Neurological Exam ;  Awake  Alert oriented x 3. Normal speech and language.eye movements full without nystagmus.fundi were not visualized. Vision acuity and fields appear normal. Hearing is normal. Palatal movements are normal. Face symmetric. Tongue midline. Normal strength, tone, reflexes and coordination. Normal sensation. Gait deferred.     ASSESSMENT/PLAN Ms. Jody Porter is a 81 y.o. female with history of dementia, depression, prediabetes, obesity, hypothyroidism, hypertension, and anxiety presenting with speech difficulties, abdominal pain, panic attack, and altered mental status. She did not receive IV t-PA  due to late presentation.  Possible left hemisphericTIA  :    Resultant   No deficits  MRI - Negative for acute infarct.  MRA - Decreased signal in left MCA branches which could be due to stenosis or artifact.  Carotid Doppler - 1-39% ICA plaquing. Vertebral artery flow is antegrade 2D Echo - Left ventricle: The cavity size was normal. Wall thickness was   normal. Systolic function was normal. The estimated ejection   fraction was in the range of 55% to 60%. LV  apical false tendon.   Wall motion was normal; there were no regional wall motion    abnormalities  LDL - 90  HgbA1c - 6.5  VTE prophylaxis - SCDs Diet Heart Room service appropriate? Yes; Fluid consistency: Thin  No antithrombotic prior to admission, now on clopidogrel 75 mg daily  Patient counseled to be compliant with her antithrombotic medications  Ongoing aggressive stroke risk factor management  Therapy recommendations: HHPT Disposition: home Hypertension  Stable  Long-term BP goal normotensive  Hyperlipidemia  Home meds:  Pravachol 20 mg daily resumed in hospital  LDL 90, goal < 70  Increase Pravachol to 40 mg daily  Continue statin at discharge  Diabetes  HgbA1c 6.5, goal < 7.0  Controlled  Other Stroke Risk Factors  Advanced age  Obesity, Body mass index is 42.93 kg/m., recommend weight loss, diet and exercise as appropriate   Obstructive sleep apnea  Other Active Problems  Hypokalemia - supplemented   Hospital day # 0    I have personally examined this patient, reviewed notes, independently viewed imaging studies, participated in medical decision making and plan of care.ROS completed by me personally and pertinent positives fully documented  I have made any additions or clarifications directly to the above note.  .Continue Plavix for stroke prevention. Long discussion with patient and daughter at the bedside and answered questions. F/U stroke clinic in 6 weeks. Stroke team will sign off. Call for questions. Delia Heady, MD Medical Director [facility] Pager: [phone] 08/12/2016 1:29 PM   To contact Stroke Continuity provider, please refer to [email or web address]. After hours, contact General Neurology

## 2016-08-12 NOTE — Evaluation (Signed)
Occupational Therapy Evaluation Patient Details Name: Jody Porter MRN: [id number] DOB: 1935/12/25 Today's Date: 08/12/2016    History of Present Illness Patient is a 81 yo female admitted 08/10/16 abdominal pain and panic attack.  Patient also with decrease in speech function.  MRI negative.   PMH:  anxiety, depression HTN, memory deficits, obesity, arthritis, obesity, Rt TKA, low back pain.   Clinical Impression   PTA, pt required assistance with donning socks but was otherwise completing basic ADL independently and using either RW or cane for functional mobility. She has assistance from her son for cooking and cleaning tasks. Pt currently requires supervision and set-up for LB ADL with AE and supervision for toilet transfers. Pt reports feeling "like nothing is real" this morning after waking up from a deep sleep. Feel pt would benefit from continued OT services while admitted to improve independence with ADL and functional mobility in preparation for return home with intermittent supervision from her son. Next session to focus on standing grooming tasks and continued AE education for LB ADL.    Follow Up Recommendations  No OT follow up;Supervision - Intermittent    Equipment Recommendations  3 in 1 bedside commode    Recommendations for Other Services       Precautions / Restrictions Precautions Precautions: Fall Restrictions Weight Bearing Restrictions: No      Mobility Bed Mobility Overal bed mobility: Needs Assistance Bed Mobility: Supine to Sit;Sit to Supine     Supine to sit: Supervision Sit to supine: Mod assist   General bed mobility comments: Pt with decreased height and has low bed which may improve her performance at home. Mod assist to raisle LE's into bed.  Transfers Overall transfer level: Needs assistance Equipment used: Rolling walker (2 wheeled) Transfers: Sit to/from Stand Sit to Stand: Supervision         General transfer comment: Supervision  for safety.    Balance Overall balance assessment: Needs assistance;History of Falls Sitting-balance support: No upper extremity supported;Feet supported Sitting balance-Leahy Scale: Good     Standing balance support: Bilateral upper extremity supported;During functional activity Standing balance-Leahy Scale: Fair Standing balance comment: Able to statically stand for ADL tasks without UE support.                            ADL either performed or assessed with clinical judgement   ADL Overall ADL's : Needs assistance/impaired Eating/Feeding: Set up;Sitting   Grooming: Supervision/safety;Standing   Upper Body Bathing: Set up;Sitting   Lower Body Bathing: Min guard;With adaptive equipment;Sit to/from stand Lower Body Bathing Details (indicate cue type and reason): VC's and assistance to set-up AE for first trial. Upper Body Dressing : Set up;Sitting   Lower Body Dressing: Min guard;Sit to/from stand Lower Body Dressing Details (indicate cue type and reason): VC's and assistance to set-up AE for first trial. Toilet Transfer: Supervision/safety;Ambulation;RW;Regular Toilet   Toileting- Architect and Hygiene: Supervision/safety;Sit to/from Nurse, children's Details (indicate cue type and reason): deferred as pt does not use shower or tub at home Functional mobility during ADLs: Supervision/safety;Rolling walker       Vision Baseline Vision/History:  (Poor depth perception; awaiting L eye surgery ) Patient Visual Report: No change from baseline Vision Assessment?: No apparent visual deficits     Perception     Praxis Praxis Praxis tested?: Within functional limits    Pertinent Vitals/Pain Pain Assessment: Faces Faces Pain Scale: Hurts little more  Pain Location: Rt knee Pain Descriptors / Indicators: Aching;Sore Pain Intervention(s): Monitored during session;Repositioned     Hand Dominance Right   Extremity/Trunk Assessment Upper  Extremity Assessment Upper Extremity Assessment: Overall WFL for tasks assessed   Lower Extremity Assessment Lower Extremity Assessment: Generalized weakness;RLE deficits/detail RLE Deficits / Details: Multiple previous knee surgeries and awaiting hip surgery. Pain with weight bearing and movement of knee.       Communication Communication Communication: No difficulties   Cognition Arousal/Alertness: Awake/alert Behavior During Therapy: Anxious Overall Cognitive Status: Within Functional Limits for tasks assessed                                 General Comments: Pt reports feeling "like nothing is real" this am after sleeping soundly last night. She reports that she saw people in her room last night but is not sure if this was staff or if she was seeing things.   General Comments       Exercises     Shoulder Instructions      Home Living Family/patient expects to be discharged to:: Private residence Living Arrangements: Children (son who works and is not there during the day) Available Help at Discharge: Family;Available PRN/intermittently Type of Home: House Home Access: Stairs to enter [employer] of Steps: 3 Entrance Stairs-Rails: None Home Layout: Multi-level Alternate Level Stairs-Number of Steps: 2 Alternate Level Stairs-Rails:  (grab bars at room level changes) Bathroom Shower/Tub: Tub/shower unit;Walk-in shower (Does not use either; only does sponge baths)   Bathroom Toilet: Standard     Home Equipment: Walker - 4 wheels;Walker - 2 wheels;Shower seat;Wheelchair - manual;Cane - single point          Prior Functioning/Environment Level of Independence: Independent with assistive device(s);Needs assistance  Gait / Transfers Assistance Needed: Uses RW or cane for ambulation since September 2017 ADL's / Homemaking Assistance Needed: Son prepares most meal and does housekeeping.  Patient able to complete own bathing/dressing except for  socks (her son assists with this).            OT Problem List: Decreased strength;Decreased activity tolerance;Impaired balance (sitting and/or standing);Impaired vision/perception;Decreased knowledge of use of DME or AE;Decreased safety awareness;Decreased knowledge of precautions;Pain      OT Treatment/Interventions: Self-care/ADL training;Therapeutic exercise;Energy conservation;Therapeutic activities;Visual/perceptual remediation/compensation;Patient/family education;Balance training    OT Goals(Current goals can be found in the care plan section) Acute Rehab OT Goals Patient Stated Goal: to go home OT Goal Formulation: With patient Time For Goal Achievement: 08/26/16 Potential to Achieve Goals: Good ADL Goals Pt Will Perform Grooming: with modified independence;standing (3 tasks inlcuding gathering items) Pt Will Perform Lower Body Bathing: with modified independence;sit to/from stand;with adaptive equipment Pt Will Perform Lower Body Dressing: with modified independence;with adaptive equipment;sit to/from stand Pt Will Transfer to Toilet: with modified independence;ambulating;bedside commode (BSC over toilet) Pt Will Perform Toileting - Clothing Manipulation and hygiene: with modified independence;sit to/from stand  OT Frequency: Min 2X/week   Barriers to D/C:            Co-evaluation              End of Session Equipment Utilized During Treatment: Engineer, water Communication: Mobility status  Activity Tolerance: Patient tolerated treatment well Patient left: in bed (Leaving for EEG with transport team)  OT Visit Diagnosis: Unsteadiness on feet (R26.81);Pain Pain - Right/Left: Right Pain - part of body: Knee  Time: 1610-9604 OT Time Calculation (min): 34 min Charges:  OT General Charges $OT Visit: 1 Procedure OT Evaluation $OT Eval Moderate Complexity: 1 Procedure OT Treatments $Self Care/Home Management : 8-22 mins G-Codes: OT  G-codes **NOT FOR INPATIENT CLASS** Functional Assessment Tool Used: AM-PAC 6 Clicks Daily Activity Functional Limitation: Self care Self Care Current Status (V4098): At least 20 percent but less than 40 percent impaired, limited or restricted Self Care Goal Status (J1914): At least 1 percent but less than 20 percent impaired, limited or restricted   Doristine Section, MS OTR/L  Pager: [phone]   Saket Hellstrom A Ola Fawver 08/12/2016, 9:23 AM

## 2016-08-13 DIAGNOSIS — R109 Unspecified abdominal pain: Secondary | ICD-10-CM

## 2016-08-13 DIAGNOSIS — G454 Transient global amnesia: Secondary | ICD-10-CM

## 2016-08-13 LAB — MRSA PCR SCREENING: MRSA BY PCR: NEGATIVE

## 2016-08-13 MED ORDER — POTASSIUM CHLORIDE CRYS ER 20 MEQ PO TBCR
40.0000 meq | EXTENDED_RELEASE_TABLET | Freq: Once | ORAL | Status: AC
  Administered 2016-08-13: 40 meq via ORAL
  Filled 2016-08-13: qty 2

## 2016-08-13 NOTE — Progress Notes (Signed)
    [facility] has been requested to perform a transesophageal echocardiogram on Jody Porter for CVA.  After careful review of history and examination, the risks and benefits of transesophageal echocardiogram have been explained including risks of esophageal damage, perforation (1:10,000 risk), bleeding, pharyngeal hematoma as well as other potential complications associated with conscious sedation including aspiration, arrhythmia, respiratory failure and death. Alternatives to treatment were discussed, questions were answered. Patient is willing to proceed.   Cline Crock, PA-C  08/13/2016 2:35 PM

## 2016-08-13 NOTE — Progress Notes (Signed)
qPhysical Therapy Treatment Patient Details Name: Jody Porter [facility] MRN: [id number] DOB: 07-27-1935 Today's Date: 08/13/2016    History of Present Illness Patient is a 81 yo female admitted 08/10/16 abdominal pain and panic attack.  Patient also with decrease in speech function.  MRI negative.   PMH:  anxiety, depression HTN, memory deficits, obesity, arthritis, obesity, Rt TKA, low back pain.    PT Comments    Patient is making improvements with mobility and gait.   Follow Up Recommendations  Home health PT;Supervision for mobility/OOB     Equipment Recommendations  None recommended by PT    Recommendations for Other Services       Precautions / Restrictions Precautions Precautions: Fall Restrictions Weight Bearing Restrictions: No    Mobility  Bed Mobility Overal bed mobility: Needs Assistance Bed Mobility: Supine to Sit;Sit to Supine     Supine to sit: Supervision;HOB elevated Sit to supine: Min assist   General bed mobility comments: Assist to bring RLE onto bed to return to supine.  Able to use bed rails to scoot to [facility] in supine.  Transfers Overall transfer level: Needs assistance Equipment used: Rolling walker (2 wheeled) Transfers: Sit to/from Stand Sit to Stand: Supervision         General transfer comment: Supervision for safety   Ambulation/Gait Ambulation/Gait assistance: Min guard Ambulation Distance (Feet): 82 Feet Assistive device: Rolling walker (2 wheeled) Gait Pattern/deviations: Step-through pattern;Decreased stride length;Decreased stance time - right;Decreased weight shift to right;Antalgic Gait velocity: decreased Gait velocity interpretation: Below normal speed for age/gender General Gait Details: Patient with slow, steady gait with RW.  Remains antalgic on RLE.  Reports hip pain following gait.  Knee "only stiff"   Stairs            Wheelchair Mobility    Modified Rankin (Stroke Patients Only)       Balance                                            Cognition Arousal/Alertness: Awake/alert Behavior During Therapy: WFL for tasks assessed/performed Overall Cognitive Status: Within Functional Limits for tasks assessed                                        Exercises      General Comments        Pertinent Vitals/Pain Pain Assessment: 0-10 Pain Score: 5  Pain Location: Rt hip following gait;  Rt knee "stiff" Pain Descriptors / Indicators: Aching;Sore Pain Intervention(s): Repositioned;Patient requesting pain meds-RN notified    Home Living                      Prior Function            PT Goals (current goals can now be found in the care plan section) Acute Rehab PT Goals Patient Stated Goal: to go home Progress towards PT goals: Progressing toward goals    Frequency    Min 3X/week      PT Plan Current plan remains appropriate    Co-evaluation             End of Session Equipment Utilized During Treatment: Gait belt Activity Tolerance: Patient limited by pain Patient left: in bed;with call bell/phone within reach;with family/visitor present;with bed alarm  set Nurse Communication: Mobility status;Patient requests pain meds PT Visit Diagnosis: Unsteadiness on feet (R26.81);Difficulty in walking, not elsewhere classified (R26.2);History of falling (Z91.81);Muscle weakness (generalized) (M62.81);Pain Pain - Right/Left: Right Pain - part of body: Hip     Time: 1914-7829 PT Time Calculation (min) (ACUTE ONLY): 10 min  Charges:  $Gait Training: 8-22 mins                    G Codes:       Durenda Hurt. Renaldo Fiddler, [facility] Acute Rehab Services Pager [phone]    Vena Austria 08/13/2016, 12:30 PM

## 2016-08-13 NOTE — Progress Notes (Signed)
  Speech Language Pathology:    Patient Details Name: Jody Porter MRN: [id number] DOB: 1935/12/02 Today's Date: 08/13/2016 Time:  -     Assessment / Plan / Recommendation Clinical Impression  Screened pt for speech, language, and cognition (formal evaluation not warranted). Per patient and daughter, speech and word finding difficulties have since resolved. No concerns re: cognition or speech. ST signing off.   HPI HPI: Patient is a 81 yo female admitted 08/10/16 for abdominal pain and panic attack. Patient also with a decrease in speech function and expressing herself. MRI negative for acute infarct. PMH: anxiety, depression HTN, memory deficits, obesity, arthritis, obesity, Rt TKA, low back pain.         Macarthur Critchley , Student-SLP 08/13/2016, 11:42 AM

## 2016-08-13 NOTE — Progress Notes (Signed)
[facility] PROGRESS NOTE  Jody Porter ZOX:[id number] DOB: 06-25-1935 DOA: 08/10/2016   PCP: Pcp Not In System     Assessment/Plan: Active Problems:   TIA (transient ischemic attack)   Word finding difficulty   Abdominal discomfort   Hypercalcemia   81 y.o.femalewith a PMHx anxiety, depression, HTN, GERD, obesity, who presents to the Emergency Department complaining of persistent, worsening anxiousness beginning this early this morning. She notes associated shortness of breath and speech difficulty which she describes as being unable to form the words that she is thinking. Per daughter, she also has been commenting on several objects on the walls around her which have not actually been there. Per family, pt has a h/o panic attacks which her symptoms today are consistent with; however, her prior symptoms associated with this have not previously been associated with speech difficulty.Pt is currently followed by psychotherapist and she has been compliant with her anti-anxiety medications recently.CT head at [facility] was negative for acute abnormality. She was transferred to [facility] for stroke/TIA work up.    1. AMS , expressive aphasia ? r/o TIA Tele  MRI- Negative for acute infarct.  MRA- Decreased signal in left MCA branches which could be due to stenosis or artifact. EEG This is a normal EEG for the patients stated age Carotid ultrasound 1-39% ICA plaquing. Vertebral artery flow is antegrade Cardiac echo  LV EF: 55% -   60%, mass in the RA on apical 4 chamber views, TEE pending Neurology consulted   No antithrombotic prior to admission, now on clopidogrel 75 mg daily LDL-90, triglycerides 194-continue statin Hemoglobin A1c 6.7 Telemetry shows normal sinus rhythm Venous Doppler due to recent surgery-abnormal d-dimer, no pulmonary symptoms, low suspicion for PE [no cardiopulmonary symptoms]. Venous Doppler negative for DVT TEE scheduled for 4/4    2. Hypercalcemia,  likely slightly dehydrated, albumin okay, 4.3   3. Abdominal firmness KUB unremarkable  4. Hypertension Cont current bp medication   5. Anemia Repeat cbc in am. Hemoglobin 11.9 prior to discharge  6. Hyperglycemia Hemoglobin A1c 6.7, on metformin  7.Hypothyroidism-TSH pending, continue Synthroid  8.Hypokalemia-repleted, Recheck BMP tomorrow  9.Depression/anxiety, stable, continuedremeron  10.Bladder spasms,stable, continuedoxybutynin  11. Constipation,   senna and miralax  12. Obesity, Body mass index is 42.93 kg/m., recommend weight loss, diet and exercise as appropriate    DVT prophylaxsis SCDs  Code Status:  Full code    Family Communication: Discussed in detail with the patient, all imaging results, lab results explained to the patient   Disposition Plan:  Anticipate discharge tomorrow after TEE      Consultants:  Neurology  Procedures:  None  Antibiotics: Anti-infectives    None         HPI/Subjective: No complaints , answered all questions asked by patient and daughter regarding TEE  Objective: Vitals:   08/12/16 1750 08/12/16 2104 08/13/16 0016 08/13/16 0545  BP: (!) 142/74 (!) 151/58 (!) 127/48 (!) 156/71  Pulse: 89 84 73 73  Resp: Temp: 99.7 F (37.6 C) 98.4 F (36.9 C) 98.2 F (36.8 C) 98.4 F (36.9 C)  TempSrc: Oral Oral Oral Oral  SpO2: 99% 96% 96% 98%  Weight:      Height:       No intake or output data in the 24 hours ending 08/13/16 1251  Exam:  Examination:  General exam: Appears calm and comfortable  Respiratory system: Clear to auscultation. Respiratory effort normal. Cardiovascular system: S1 &  S2 heard, RRR. No JVD, murmurs, rubs, gallops or clicks. No pedal edema. Gastrointestinal system: Abdomen is nondistended, soft and nontender. No organomegaly or masses felt. Normal bowel sounds heard. Central nervous system: Alert and oriented. No focal neurological deficits. Extremities:  Symmetric 5 x 5 power. Skin: No rashes, lesions or ulcers Psychiatry: Judgement and insight appear normal. Mood & affect appropriate.     Data Reviewed: I have personally reviewed following labs and imaging studies  Micro Results No results found for this or any previous visit (from the past 240 hour(s)).  Radiology Reports Dg Chest 2 View  Result Date: 08/11/2016 CLINICAL DATA:  Acute onset of worsening generalized abdominal pain. Initial encounter. EXAM: CHEST  2 VIEW COMPARISON:  Chest radiograph performed 04/01/2016 FINDINGS: The lungs are well-aerated and clear. There is no evidence of focal opacification, pleural effusion or pneumothorax. The heart is normal in size; the mediastinal contour is within normal limits. No acute osseous abnormalities are seen. IMPRESSION: No acute cardiopulmonary process seen. Electronically Signed   By: Roanna Raider M.D.   On: 08/11/2016 02:29   Dg Abd 1 View  Result Date: 08/11/2016 CLINICAL DATA:  Acute onset of worsening generalized abdominal pain. Initial encounter. EXAM: ABDOMEN - 1 VIEW COMPARISON:  Abdominal radiograph performed 02/28/2016 FINDINGS: The visualized bowel gas pattern is unremarkable. Scattered air and stool filled loops of colon are seen; no abnormal dilatation of small bowel loops is seen to suggest small bowel obstruction. No free intra-abdominal air is identified, though evaluation for free air is limited on a single supine view. Mild degenerative change is noted along the lumbar spine; the sacroiliac joints are unremarkable in appearance. Diffuse joint space narrowing is noted at the right hip. The visualized lung bases are essentially clear. IMPRESSION: Unremarkable bowel gas pattern; no free intra-abdominal air seen. Small amount of stool noted in the colon. Electronically Signed   By: Roanna Raider M.D.   On: 08/11/2016 02:30   Ct Head Wo Contrast  Result Date: 08/11/2016 CLINICAL DATA:  Expressive aphasia. EXAM: CT HEAD WITHOUT  CONTRAST TECHNIQUE: Contiguous axial images were obtained from the base of the skull through the vertex without intravenous contrast. COMPARISON:  Head CT 10/17/2015 FINDINGS: Brain: No evidence of acute infarction, hemorrhage, hydrocephalus, extra-axial collection or mass lesion/mass effect. Age related atrophy and mild chronic small vessel ischemia, stable from prior exam. Vascular: No hyperdense vessel or unexpected calcification. Skull: Stable scleroses in the left frontal bone. No acute abnormality. Sinuses/Orbits: Paranasal sinuses and mastoid air cells are clear. The visualized orbits are unremarkable. Other: Bilateral cataract resection. IMPRESSION: No acute intracranial abnormality. Electronically Signed   By: Rubye Oaks M.D.   On: 08/11/2016 00:04   Mr Brain Wo Contrast  Result Date: 08/11/2016 CLINICAL DATA:  TIA.  Expressive aphasia EXAM: MRI HEAD WITHOUT CONTRAST MRA HEAD WITHOUT CONTRAST TECHNIQUE: Multiplanar, multiecho pulse sequences of the brain and surrounding structures were obtained without intravenous contrast. Angiographic images of the head were obtained using MRA technique without contrast. COMPARISON:  CT head 08/10/2016 FINDINGS: MRI HEAD FINDINGS Brain: Negative for acute infarct. Mild chronic microvascular ischemic change in the white matter. Ventricle size normal. Negative for hemorrhage or mass. Vascular: Normal arterial flow voids. Skull and upper cervical spine: Negative Sinuses/Orbits: Negative Other: Image quality degraded by mild motion. MRA HEAD FINDINGS Internal carotid artery patent bilaterally without significant stenosis. Decreased signal in left MCA branches may be artifactual versus MCA bifurcation stenosis. Right MCA branches patent. Both anterior cerebral arteries patent. Both  vertebral arteries patent to the basilar. Basilar widely patent. PICA, superior cerebellar, and posterior cerebral arteries patent bilaterally. Fetal origin right posterior cerebral artery.  IMPRESSION: Negative for acute infarct. Mild chronic microvascular ischemic changes in the white matter Decreased signal in left MCA branches which could be due to stenosis or artifact. Image quality is degraded by motion. Electronically Signed   By: Marlan Palau M.D.   On: 08/11/2016 11:33   Dg Hand Complete Left  Result Date: 07/19/2016 CLINICAL DATA:  Bloody discharge from left middle finger for 2 days. EXAM: LEFT HAND - COMPLETE 3+ VIEW COMPARISON:  None. FINDINGS: No fracture. No subluxation or dislocation. Degenerative changes are seen in scattered IP joints. Soft tissue swelling noted at the tip of the middle finger without underlying bony erosion or destruction to suggest overt osteomyelitis. No evidence for radiopaque soft tissue foreign body. IMPRESSION: Soft tissue swelling distal aspect of middle finger without underlying bony abnormality. Electronically Signed   By: Kennith Center M.D.   On: 07/19/2016 17:50   Mr Maxine Glenn Head/brain ON Cm  Result Date: 08/11/2016 CLINICAL DATA:  TIA.  Expressive aphasia EXAM: MRI HEAD WITHOUT CONTRAST MRA HEAD WITHOUT CONTRAST TECHNIQUE: Multiplanar, multiecho pulse sequences of the brain and surrounding structures were obtained without intravenous contrast. Angiographic images of the head were obtained using MRA technique without contrast. COMPARISON:  CT head 08/10/2016 FINDINGS: MRI HEAD FINDINGS Brain: Negative for acute infarct. Mild chronic microvascular ischemic change in the white matter. Ventricle size normal. Negative for hemorrhage or mass. Vascular: Normal arterial flow voids. Skull and upper cervical spine: Negative Sinuses/Orbits: Negative Other: Image quality degraded by mild motion. MRA HEAD FINDINGS Internal carotid artery patent bilaterally without significant stenosis. Decreased signal in left MCA branches may be artifactual versus MCA bifurcation stenosis. Right MCA branches patent. Both anterior cerebral arteries patent. Both vertebral arteries  patent to the basilar. Basilar widely patent. PICA, superior cerebellar, and posterior cerebral arteries patent bilaterally. Fetal origin right posterior cerebral artery. IMPRESSION: Negative for acute infarct. Mild chronic microvascular ischemic changes in the white matter Decreased signal in left MCA branches which could be due to stenosis or artifact. Image quality is degraded by motion. Electronically Signed   By: Marlan Palau M.D.   On: 08/11/2016 11:33     CBC  Recent Labs Lab 08/10/16 2127  WBC 8.7  HGB 11.9*  HCT 36.7  PLT 222  MCV 84.8  MCH 27.5  MCHC 32.4  RDW 15.2    Chemistries   Recent Labs Lab 08/10/16 2127 08/11/16 0351  NA 140 136  K 3.5 3.1*  CL 106 100*  CO2 19* 22  GLUCOSE 156* 181*  BUN 16 15  CREATININE 0.98 1.09*  CALCIUM 10.6* 10.2  AST 29 28  ALT 17 15  ALKPHOS 106 94  BILITOT 0.8 0.9   ------------------------------------------------------------------------------------------------------------------ estimated creatinine clearance is 39.8 mL/min (A) (by C-G formula based on SCr of 1.09 mg/dL (H)). ------------------------------------------------------------------------------------------------------------------  Recent Labs  08/11/16 0350  HGBA1C 6.7*   ------------------------------------------------------------------------------------------------------------------  Recent Labs  08/11/16 0350  CHOL 187  HDL 58  LDLCALC 90  TRIG 194*  CHOLHDL 3.2   ------------------------------------------------------------------------------------------------------------------ No results for input(s): TSH, T4TOTAL, T3FREE, THYROIDAB in the last 72 hours.  Invalid input(s): FREET3 ------------------------------------------------------------------------------------------------------------------ No results for input(s): VITAMINB12, FOLATE, FERRITIN, TIBC, IRON, RETICCTPCT in the last 72 hours.  Coagulation profile No results for input(s): INR,  PROTIME in the last 168 hours.   Recent Labs  08/11/16 1447  DDIMER 2.66*  Cardiac Enzymes No results for input(s): CKMB, TROPONINI, MYOGLOBIN in the last 168 hours.  Invalid input(s): CK ------------------------------------------------------------------------------------------------------------------ Invalid input(s): POCBNP   CBG: No results for input(s): GLUCAP in the last 168 hours.     Studies: No results found.    Lab Results  Component Value Date   HGBA1C 6.7 (H) 08/11/2016   HGBA1C 6.5 (H) 07/20/2016   HGBA1C 6.4 (H) 12/09/2012   Lab Results  Component Value Date   LDLCALC 90 08/11/2016   CREATININE 1.09 (H) 08/11/2016       Scheduled Meds: . clopidogrel  75 mg Oral Daily  . DULoxetine  30 mg Oral Daily  . levothyroxine  112 mcg Oral QAC breakfast  . LORazepam  0.5 mg Oral TID  . memantine  10 mg Oral BID  . metFORMIN  500 mg Oral Q breakfast  . mirtazapine  30 mg Oral QHS  . multivitamin with minerals  1 tablet Oral Daily  . omega-3 acid ethyl esters  1 capsule Oral BID  . oxybutynin  5 mg Oral Daily  . pantoprazole  40 mg Oral Daily  . potassium chloride  40 mEq Oral Once  . pravastatin  40 mg Oral Daily  . QUEtiapine  25 mg Oral QHS   Continuous Infusions:   LOS: 0 days    Time spent: >30 MINS    [facility]  [facility] Pager [phone]. If 7PM-7AM, please contact night-coverage at [email or web address], password [facility] 08/13/2016, 12:51 PM  LOS: 0 days

## 2016-08-14 ENCOUNTER — Observation Stay (HOSPITAL_BASED_OUTPATIENT_CLINIC_OR_DEPARTMENT_OTHER): Payer: Medicare (Managed Care)

## 2016-08-14 ENCOUNTER — Encounter (HOSPITAL_COMMUNITY)
Admission: EM | Disposition: A | Discharge status: Home / Self Care | Payer: Self-pay | Source: Home / Self Care | Attending: Emergency Medicine

## 2016-08-14 ENCOUNTER — Encounter (HOSPITAL_COMMUNITY): Payer: Self-pay | Admitting: *Deleted

## 2016-08-14 DIAGNOSIS — F419 Anxiety disorder, unspecified: Secondary | ICD-10-CM

## 2016-08-14 DIAGNOSIS — I517 Cardiomegaly: Secondary | ICD-10-CM | POA: Diagnosis not present

## 2016-08-14 DIAGNOSIS — G454 Transient global amnesia: Secondary | ICD-10-CM | POA: Diagnosis not present

## 2016-08-14 DIAGNOSIS — R109 Unspecified abdominal pain: Secondary | ICD-10-CM | POA: Diagnosis not present

## 2016-08-14 HISTORY — PX: TEE WITHOUT CARDIOVERSION: SHX5443

## 2016-08-14 LAB — COMPREHENSIVE METABOLIC PANEL
ALBUMIN: 3.4 g/dL — AB (ref 3.5–5.0)
ALK PHOS: 98 U/L (ref 38–126)
ALT: 19 U/L (ref 14–54)
ANION GAP: 8 (ref 5–15)
AST: 27 U/L (ref 15–41)
BILIRUBIN TOTAL: 0.5 mg/dL (ref 0.3–1.2)
BUN: 10 mg/dL (ref 6–20)
CALCIUM: 9.2 mg/dL (ref 8.9–10.3)
CO2: 25 mmol/L (ref 22–32)
CREATININE: 0.85 mg/dL (ref 0.44–1.00)
Chloride: 108 mmol/L (ref 101–111)
GFR calc Af Amer: >60 mL/min (ref >60)
GFR calc non Af Amer: >60 mL/min (ref >60)
Glucose, Bld: 117 mg/dL — ABNORMAL HIGH (ref 65–99)
Potassium: 4.3 mmol/L (ref 3.5–5.1)
Sodium: 141 mmol/L (ref 135–145)
TOTAL PROTEIN: 6 g/dL — AB (ref 6.5–8.1)

## 2016-08-14 LAB — HEPATIC FUNCTION PANEL
ALK PHOS: 98 U/L (ref 25–125)
ALT: 19 U/L (ref 7–35)
AST: 27 U/L (ref 13–35)
BILIRUBIN, TOTAL: 0.5 mg/dL

## 2016-08-14 LAB — CBC
HCT: 37.8 % (ref 36.0–46.0)
HEMOGLOBIN: 11.8 g/dL — AB (ref 12.0–15.0)
MCH: 27.1 pg (ref 26.0–34.0)
MCHC: 31.2 g/dL (ref 30.0–36.0)
MCV: 86.7 fL (ref 78.0–100.0)
PLATELETS: 157 10*3/uL (ref 150–400)
RBC: 4.36 MIL/uL (ref 3.87–5.11)
RDW: 16 % — ABNORMAL HIGH (ref 11.5–15.5)
WBC: 7.7 10*3/uL (ref 4.0–10.5)

## 2016-08-14 LAB — BASIC METABOLIC PANEL
BUN: 10 mg/dL (ref 4–21)
CREATININE: 0.9 mg/dL (ref 0.5–1.1)
Glucose: 117 mg/dL
Potassium: 4.3 mmol/L (ref 3.4–5.3)
SODIUM: 141 mmol/L (ref 137–147)

## 2016-08-14 LAB — GLUCOSE, CAPILLARY: GLUCOSE-CAPILLARY: 120 mg/dL — AB (ref 65–99)

## 2016-08-14 LAB — CBC AND DIFFERENTIAL
HEMATOCRIT: 38 % (ref 36–46)
Hemoglobin: 11.8 g/dL — AB (ref 12.0–16.0)
Platelets: 157 10*3/uL (ref 150–399)
WBC: 7.7 10^3/mL

## 2016-08-14 SURGERY — ECHOCARDIOGRAM, TRANSESOPHAGEAL
Anesthesia: Moderate Sedation

## 2016-08-14 MED ORDER — BUTAMBEN-TETRACAINE-BENZOCAINE 2-2-14 % EX AERO
INHALATION_SPRAY | CUTANEOUS | Status: DC | PRN
  Administered 2016-08-14: 2 via TOPICAL

## 2016-08-14 MED ORDER — SODIUM CHLORIDE 0.9 % IV SOLN
INTRAVENOUS | Status: DC
  Administered 2016-08-14: 15:00:00 via INTRAVENOUS

## 2016-08-14 MED ORDER — MIDAZOLAM HCL 5 MG/ML IJ SOLN
INTRAMUSCULAR | Status: AC
  Filled 2016-08-14: qty 2

## 2016-08-14 MED ORDER — FENTANYL CITRATE (PF) 100 MCG/2ML IJ SOLN
INTRAMUSCULAR | Status: AC
  Filled 2016-08-14: qty 2

## 2016-08-14 MED ORDER — LIDOCAINE VISCOUS 2 % MT SOLN
OROMUCOSAL | Status: AC
  Filled 2016-08-14: qty 15

## 2016-08-14 MED ORDER — FENTANYL CITRATE (PF) 100 MCG/2ML IJ SOLN
INTRAMUSCULAR | Status: DC | PRN
  Administered 2016-08-14 (×3): 25 ug via INTRAVENOUS

## 2016-08-14 MED ORDER — MIDAZOLAM HCL 10 MG/2ML IJ SOLN
INTRAMUSCULAR | Status: DC | PRN
  Administered 2016-08-14 (×2): 2 mg via INTRAVENOUS
  Administered 2016-08-14: 1 mg via INTRAVENOUS

## 2016-08-14 MED ORDER — LIDOCAINE VISCOUS 2 % MT SOLN
OROMUCOSAL | Status: DC | PRN
  Administered 2016-08-14: 1 via OROMUCOSAL

## 2016-08-14 NOTE — Care Management Obs Status (Signed)
MEDICARE OBSERVATION STATUS NOTIFICATION   Patient Details  Name: Jody Porter MRN: [id number] Date of Birth: 08-21-1935   Medicare Observation Status Notification Given:  Yes    Epifanio Lesches, RN 08/14/2016, 4:09 PM

## 2016-08-14 NOTE — CV Procedure (Signed)
    TRANSESOPHAGEAL ECHOCARDIOGRAM (TEE) NOTE  INDICATIONS: cryptogenic stroke, possible atrial mass  PROCEDURE:   Informed consent was obtained prior to the procedure. The risks, benefits and alternatives for the procedure were discussed and the patient comprehended these risks.  Risks include, but are not limited to, cough, sore throat, vomiting, nausea, somnolence, esophageal and stomach trauma or perforation, bleeding, low blood pressure, aspiration, pneumonia, infection, trauma to the teeth and death.    After a procedural time-out, the patient was given 5 mg versed and 75 mcg fentanyl for moderate sedation.  The patient's heart rate, blood pressure, and oxygen saturation are monitored continuously during the procedure.The oropharynx was anesthetized 10 cc of topical 1% viscous lidocaine and 2 cetacaine sprays.  The transesophageal probe was inserted in the esophagus and stomach without difficulty and multiple views were obtained.  The patient was kept under observation until the patient left the procedure room.  The period of conscious sedation is 20 minutes, of which I was present face-to-face 100% of this time. The patient left the procedure room in stable condition.   Agitated microbubble saline contrast was administered.  COMPLICATIONS:    There were no immediate complications.  Findings:  1. LEFT VENTRICLE: The left ventricular wall thickness is normal.  The left ventricular cavity is normal in size. Wall motion is normal.  LVEF is 55-60%.  2. RIGHT VENTRICLE:  The right ventricle is normal in structure and function without any thrombus or masses.    3. LEFT ATRIUM:  The left atrium is moderately dilated in size without any thrombus or masses.  There is not spontaneous echo contrast ("smoke") in the left atrium consistent with a low flow state.  4. LEFT ATRIAL APPENDAGE:  The left atrial appendage is free of any thrombus or masses. The appendage has single lobes. Pulse doppler  indicates moderate flow in the appendage.  5. ATRIAL SEPTUM:  The atrial septum appears intact and is free of thrombus and/or masses.  There is no evidence for interatrial shunting by color doppler and saline microbubble.  6. RIGHT ATRIUM:  The right atrium is normal in size and function without any thrombus or masses.  7. MITRAL VALVE:  The mitral valve is normal in structure and function with Mild regurgitation.  There were no vegetations or stenosis.  8. AORTIC VALVE:  The aortic valve is trileaflet, normal in structure and function with no regurgitation.  There were no vegetations or stenosis  9. TRICUSPID VALVE:  The tricuspid valve is normal in structure and function with trivial regurgitation.  There were no vegetations or stenosis  10.  PULMONIC VALVE:  The pulmonic valve is normal in structure and function with trace to mild regurgitation.  There were no vegetations or stenosis.   11. AORTIC ARCH, ASCENDING AND DESCENDING AORTA:  There was grade 1 Myrtis Ser et. Al, 1992) atherosclerosis of the ascending aorta, aortic arch, or proximal descending aorta.  12. PULMONARY VEINS: Anomalous pulmonary venous return was not noted.  13. PERICARDIUM: The pericardium appeared normal and non-thickened.  There is no pericardial effusion.  IMPRESSION:   1. No LAA thrombus 2. Negative for PFO 3. No RA mass 4. LVEF 55-60%  RECOMMENDATIONS:    1.  No cardiac source of emboli noted.  Time Spent Directly with the Patient:  20 minutes   Chrystie Nose, MD, [facility] Attending Cardiologist [facility]  08/14/2016, 3:42 PM

## 2016-08-14 NOTE — H&P (Signed)
    INTERVAL PROCEDURE H&P  History and Physical Interval Note:  08/14/2016 2:16 PM  Jody Porter has presented today for their planned procedure. The various methods of treatment have been discussed with the patient and family. After consideration of risks, benefits and other options for treatment, the patient has consented to the procedure.  The patients' outpatient history has been reviewed, patient examined, and no change in status from most recent office note within the past 30 days. I have reviewed the patients' chart and labs and will proceed as planned. Questions were answered to the patient's satisfaction.   Chrystie Nose, MD, [facility] Attending Cardiologist [facility]  Chrystie Nose 08/14/2016, 2:16 PM

## 2016-08-14 NOTE — Progress Notes (Signed)
Occupational Therapy Treatment Patient Details Name: Jody Porter MRN: [id number] DOB: [birth year] Today's Date: 08/14/2016    History of present illness Patient is a 81 yo female admitted 08/10/16 abdominal pain and panic attack.  Patient also with decrease in speech function.  MRI negative.   PMH:  anxiety, depression HTN, memory deficits, obesity, arthritis, obesity, Rt TKA, low back pain.   OT comments  Pt making progress towards OT goals this session. Pt able to participate in sink level grooming and LB dressing with sock donner to continue AE education. Pt continues to benefit from skilled OT in the acute setting prior to dc to venue below. Next session to focus on energy conservation education.   Follow Up Recommendations  No OT follow up;Supervision - Intermittent    Equipment Recommendations  3 in 1 bedside commode    Recommendations for Other Services      Precautions / Restrictions Precautions Precautions: Fall Restrictions Weight Bearing Restrictions: No       Mobility Bed Mobility Overal bed mobility: Needs Assistance Bed Mobility: Supine to Sit     Supine to sit: Supervision;HOB elevated     General bed mobility comments: HOB elevated, and use of bed rails to assist  Transfers Overall transfer level: Needs assistance Equipment used: Rolling walker (2 wheeled) Transfers: Sit to/from Stand Sit to Stand: Supervision         General transfer comment: vc for safe hand placement as Pt pulls up on RW, and vc for sitting and safe hand placement    Balance Overall balance assessment: Needs assistance;History of Falls Sitting-balance support: No upper extremity supported;Feet unsupported ((Pt cannot reach the floor when on the EOB or chair)) Sitting balance-Leahy Scale: Good     Standing balance support: Bilateral upper extremity supported;During functional activity Standing balance-Leahy Scale: Poor Standing balance comment: Reliant on RW for stability,  and leans/rests on sink during grooming                           ADL either performed or assessed with clinical judgement   ADL Overall ADL's : Needs assistance/impaired     Grooming: Supervision/safety;Standing;Wash/dry hands;Wash/dry face;Oral care Grooming Details (indicate cue type and reason): Pt using RW appropriately and leaning against sink during grooming tasks.              Lower Body Dressing: Minimal assistance;Sit to/from stand;With adaptive equipment Lower Body Dressing Details (indicate cue type and reason): use of sock donner during session, cues for sequencing. "I learned how to do this the day before yesterday"             Functional mobility during ADLs: Supervision/safety;Rolling walker       Vision   Vision Assessment?: No apparent visual deficits Additional Comments: Pt provided glasses when RN came in with form to sign, but Pt did not put them on, instead she chooses to ask for assistance   Perception     Praxis      Cognition Arousal/Alertness: Awake/alert Behavior During Therapy: WFL for tasks assessed/performed Overall Cognitive Status: Within Functional Limits for tasks assessed                                          Exercises     Shoulder Instructions       General Comments Pt very pleasant and friendly  Pertinent Vitals/ Pain       Pain Assessment: 0-10 Pain Score: 5  Pain Location: Rt hip with movement;  Rt knee "stiff; it doesn't bend" Pain Descriptors / Indicators: Aching;Sore;Spasm Pain Intervention(s): Repositioned  Home Living                                          Prior Functioning/Environment              Frequency  Min 2X/week        Progress Toward Goals  OT Goals(current goals can now be found in the care plan section)  Progress towards OT goals: Progressing toward goals  Acute Rehab OT Goals Patient Stated Goal: to get something to eat after  test OT Goal Formulation: With patient Time For Goal Achievement: 08/26/16 Potential to Achieve Goals: Good  Plan Discharge plan remains appropriate    Co-evaluation                 End of Session Equipment Utilized During Treatment: Rolling walker;Gait belt  OT Visit Diagnosis: Unsteadiness on feet (R26.81);Pain Pain - Right/Left: Right Pain - part of body: Knee   Activity Tolerance Patient tolerated treatment well   Patient Left in chair;with call bell/phone within reach   Nurse Communication Mobility status (no chair alarm)        Time: 1101-1135 OT Time Calculation (min): 34 min  Charges: OT General Charges $OT Visit: 1 Procedure OT Treatments $Self Care/Home Management : 8-22 mins $Therapeutic Activity: 8-22 mins  Sherryl Manges OTR/L [phone]  Jody Porter 08/14/2016, 11:40 AM

## 2016-08-14 NOTE — Progress Notes (Signed)
Patient is discharged from room 5M04 at this time. Alert and in stable condition. IV site d/c'd as well as tele. Instructions read to patient with understanding verbalized. Left unit via wheelchair with all belongings and daughter at side.

## 2016-08-14 NOTE — Discharge Summary (Addendum)
Physician Discharge Summary  Jody Porter MRN: [id number] DOB/AGE: 08/27/1935 81 y.o.  PCP: Pcp Not In System   Admit date: 08/10/2016 Discharge date: 08/14/2016  Discharge Diagnoses:    Active Problems:   TIA (transient ischemic attack)   Word finding difficulty   Abdominal discomfort   Hypercalcemia    Follow-up recommendations Follow-up with PCP in 3-5 days , including all  additional recommended appointments as below Follow-up CBC, CMP in 3-5 days F/U stroke clinic in 6 weeks       Current Discharge Medication List    START taking these medications   Details  clopidogrel (PLAVIX) 75 MG tablet Take 1 tablet (75 mg total) by mouth daily. Qty: 30 tablet, Refills: 1      CONTINUE these medications which have CHANGED   Details  pravastatin (PRAVACHOL) 40 MG tablet Take 1 tablet (40 mg total) by mouth daily. Qty: 30 tablet, Refills: 1      CONTINUE these medications which have NOT CHANGED   Details  acetaminophen (TYLENOL) 500 MG tablet Take 1,000 mg by mouth 2 (two) times daily as needed for mild pain.    doxycycline (VIBRAMYCIN) 100 MG capsule Take 1 capsule (100 mg total) by mouth 2 (two) times daily. Qty: 10 capsule, Refills: 0    DULoxetine (CYMBALTA) 30 MG capsule Take 1 capsule by mouth daily. Refills: 3    esomeprazole (NEXIUM) 20 MG capsule Take 20 mg by mouth daily at 12 noon.    HYDROcodone-acetaminophen (NORCO/VICODIN) 5-325 MG tablet Take 1-2 tablets by mouth every 8 (eight) hours as needed for severe pain.  Refills: 0    levothyroxine (SYNTHROID, LEVOTHROID) 112 MCG tablet TAKE 1 TABLET (112 MCG TOTAL) BY MOUTH DAILY. Qty: 30 tablet, Refills: 0    LORazepam (ATIVAN) 0.5 MG tablet Take 0.5 mg by mouth 3 (three) times daily.  Refills: 3    memantine (NAMENDA) 10 MG tablet Take 10 mg by mouth 2 (two) times daily. Refills: 2    metFORMIN (GLUCOPHAGE) 500 MG tablet Take 500 mg by mouth daily. Refills: 5    mirtazapine (REMERON) 30 MG  tablet Take 30 mg by mouth at bedtime.    Multiple Vitamins-Minerals (HAIR SKIN AND NAILS FORMULA) TABS Take 1 tablet by mouth daily.    omega-3 acid ethyl esters (LOVAZA) 1 g capsule Take 1 capsule by mouth 2 (two) times daily.    oxybutynin (DITROPAN-XL) 5 MG 24 hr tablet Take 1 tablet by mouth daily. Refills: 3    QUEtiapine (SEROQUEL) 25 MG tablet Take 25 mg by mouth at bedtime.      STOP taking these medications     ibuprofen (ADVIL,MOTRIN) 200 MG tablet         Discharge Condition: Stable   Discharge Instructions Get Medicines reviewed and adjusted: Please take all your medications with you for your next visit with your Primary MD  Please request your Primary MD to go over all hospital tests and procedure/radiological results at the follow up, please ask your Primary MD to get all Hospital records sent to his/her office.  If you experience worsening of your admission symptoms, develop shortness of breath, life threatening emergency, suicidal or homicidal thoughts you must seek medical attention immediately by calling 911 or calling your MD immediately if symptoms less severe.  You must read complete instructions/literature along with all the possible adverse reactions/side effects for all the Medicines you take and that have been prescribed to you. Take any new Medicines after you have completely  understood and accpet all the possible adverse reactions/side effects.   Do not drive when taking Pain medications.   Do not take more than prescribed Pain, Sleep and Anxiety Medications  Special Instructions: If you have smoked or chewed Tobacco in the last 2 yrs please stop smoking, stop any regular Alcohol and or any Recreational drug use.  Wear Seat belts while driving.  Please note  You were cared for by a hospitalist during your hospital stay. Once you are discharged, your primary care physician will handle any further medical issues. Please note that NO REFILLS for  any discharge medications will be authorized once you are discharged, as it is imperative that you return to your primary care physician (or establish a relationship with a primary care physician if you do not have one) for your aftercare needs so that they can reassess your need for medications and monitor your lab values.     Allergies  Allergen Reactions  . Aricept [Donepezil Hcl] Other (See Comments)    Nightmares  . Sudafed [Pseudoephedrine Hcl] Other (See Comments)    Hyperactive and agitated Hyperactive and agitated, congestion   . Sugar-Protein-Starch Other (See Comments)    Loses voice, congestion  . Ciprofloxacin Other (See Comments)    Stomach pains  . Prednisone Other (See Comments)    Marked mentation/ mood change  . Valium [Diazepam] Other (See Comments)    Altered mental status changes-significant problems  . Hydroxyzine Other (See Comments)    Hallucinations   . Other     PT IS OF [place]. NO BLOOD PRODUCTS.   Marland Kitchen Acyclovir Rash  . Penicillins Itching and Rash    Has patient had a PCN reaction causing immediate rash, facial/tongue/throat swelling, SOB or lightheadedness with hypotension: Yes Has patient had a PCN reaction causing severe rash involving mucus membranes or skin necrosis: No Has patient had a PCN reaction that required hospitalization No Has patient had a PCN reaction occurring within the last 10 years: No If all of the above answers are "NO", then may proceed with Cephalosporin use.       Disposition: 01-Home or Self Care   Consults: Neurology *    Significant Diagnostic Studies:  Dg Chest 2 View  Result Date: 08/11/2016 CLINICAL DATA:  Acute onset of worsening generalized abdominal pain. Initial encounter. EXAM: CHEST  2 VIEW COMPARISON:  Chest radiograph performed 04/01/2016 FINDINGS: The lungs are well-aerated and clear. There is no evidence of focal opacification, pleural effusion or pneumothorax. The heart is normal in  size; the mediastinal contour is within normal limits. No acute osseous abnormalities are seen. IMPRESSION: No acute cardiopulmonary process seen. Electronically Signed   By: Garald Balding M.D.   On: 08/11/2016 02:29   Dg Abd 1 View  Result Date: 08/11/2016 CLINICAL DATA:  Acute onset of worsening generalized abdominal pain. Initial encounter. EXAM: ABDOMEN - 1 VIEW COMPARISON:  Abdominal radiograph performed 02/28/2016 FINDINGS: The visualized bowel gas pattern is unremarkable. Scattered air and stool filled loops of colon are seen; no abnormal dilatation of small bowel loops is seen to suggest small bowel obstruction. No free intra-abdominal air is identified, though evaluation for free air is limited on a single supine view. Mild degenerative change is noted along the lumbar spine; the sacroiliac joints are unremarkable in appearance. Diffuse joint space narrowing is noted at the right hip. The visualized lung bases are essentially clear. IMPRESSION: Unremarkable bowel gas pattern; no free intra-abdominal air seen. Small amount of stool noted  in the colon. Electronically Signed   By: Garald Balding M.D.   On: 08/11/2016 02:30   Ct Head Wo Contrast  Result Date: 08/11/2016 CLINICAL DATA:  Expressive aphasia. EXAM: CT HEAD WITHOUT CONTRAST TECHNIQUE: Contiguous axial images were obtained from the base of the skull through the vertex without intravenous contrast. COMPARISON:  Head CT 10/17/2015 FINDINGS: Brain: No evidence of acute infarction, hemorrhage, hydrocephalus, extra-axial collection or mass lesion/mass effect. Age related atrophy and mild chronic small vessel ischemia, stable from prior exam. Vascular: No hyperdense vessel or unexpected calcification. Skull: Stable scleroses in the left frontal bone. No acute abnormality. Sinuses/Orbits: Paranasal sinuses and mastoid air cells are clear. The visualized orbits are unremarkable. Other: Bilateral cataract resection. IMPRESSION: No acute intracranial  abnormality. Electronically Signed   By: Jeb Levering M.D.   On: 08/11/2016 00:04   Mr Brain Wo Contrast  Result Date: 08/11/2016 CLINICAL DATA:  TIA.  Expressive aphasia EXAM: MRI HEAD WITHOUT CONTRAST MRA HEAD WITHOUT CONTRAST TECHNIQUE: Multiplanar, multiecho pulse sequences of the brain and surrounding structures were obtained without intravenous contrast. Angiographic images of the head were obtained using MRA technique without contrast. COMPARISON:  CT head 08/10/2016 FINDINGS: MRI HEAD FINDINGS Brain: Negative for acute infarct. Mild chronic microvascular ischemic change in the white matter. Ventricle size normal. Negative for hemorrhage or mass. Vascular: Normal arterial flow voids. Skull and upper cervical spine: Negative Sinuses/Orbits: Negative Other: Image quality degraded by mild motion. MRA HEAD FINDINGS Internal carotid artery patent bilaterally without significant stenosis. Decreased signal in left MCA branches may be artifactual versus MCA bifurcation stenosis. Right MCA branches patent. Both anterior cerebral arteries patent. Both vertebral arteries patent to the basilar. Basilar widely patent. PICA, superior cerebellar, and posterior cerebral arteries patent bilaterally. Fetal origin right posterior cerebral artery. IMPRESSION: Negative for acute infarct. Mild chronic microvascular ischemic changes in the white matter Decreased signal in left MCA branches which could be due to stenosis or artifact. Image quality is degraded by motion. Electronically Signed   By: Franchot Gallo M.D.   On: 08/11/2016 11:33   Dg Hand Complete Left  Result Date: 07/19/2016 CLINICAL DATA:  Bloody discharge from left middle finger for 2 days. EXAM: LEFT HAND - COMPLETE 3+ VIEW COMPARISON:  None. FINDINGS: No fracture. No subluxation or dislocation. Degenerative changes are seen in scattered IP joints. Soft tissue swelling noted at the tip of the middle finger without underlying bony erosion or destruction to  suggest overt osteomyelitis. No evidence for radiopaque soft tissue foreign body. IMPRESSION: Soft tissue swelling distal aspect of middle finger without underlying bony abnormality. Electronically Signed   By: Misty Stanley M.D.   On: 07/19/2016 17:50   Mr Jodene Nam Head/brain EH Cm  Result Date: 08/11/2016 CLINICAL DATA:  TIA.  Expressive aphasia EXAM: MRI HEAD WITHOUT CONTRAST MRA HEAD WITHOUT CONTRAST TECHNIQUE: Multiplanar, multiecho pulse sequences of the brain and surrounding structures were obtained without intravenous contrast. Angiographic images of the head were obtained using MRA technique without contrast. COMPARISON:  CT head 08/10/2016 FINDINGS: MRI HEAD FINDINGS Brain: Negative for acute infarct. Mild chronic microvascular ischemic change in the white matter. Ventricle size normal. Negative for hemorrhage or mass. Vascular: Normal arterial flow voids. Skull and upper cervical spine: Negative Sinuses/Orbits: Negative Other: Image quality degraded by mild motion. MRA HEAD FINDINGS Internal carotid artery patent bilaterally without significant stenosis. Decreased signal in left MCA branches may be artifactual versus MCA bifurcation stenosis. Right MCA branches patent. Both anterior cerebral arteries patent. Both vertebral  arteries patent to the basilar. Basilar widely patent. PICA, superior cerebellar, and posterior cerebral arteries patent bilaterally. Fetal origin right posterior cerebral artery. IMPRESSION: Negative for acute infarct. Mild chronic microvascular ischemic changes in the white matter Decreased signal in left MCA branches which could be due to stenosis or artifact. Image quality is degraded by motion. Electronically Signed   By: Franchot Gallo M.D.   On: 08/11/2016 11:33    echocardiogram  LV EF: 55% -   60%  ------------------------------------------------------------------- Indications:      CVA 436.  ------------------------------------------------------------------- History:    Risk factors:  Hypertension.  ------------------------------------------------------------------- Study Conclusions  - Left ventricle: The cavity size was normal. Wall thickness was   normal. Systolic function was normal. The estimated ejection   fraction was in the range of 55% to 60%. LV apical false tendon.   Wall motion was normal; there were no regional wall motion   abnormalities. Doppler parameters are consistent with abnormal   left ventricular relaxation (grade 1 diastolic dysfunction). The   E/e&' ratio is >15, suggesting elevated LV filling pressure. - Aortic valve: Sclerosis without stenosis. There was no   regurgitation. - Mitral valve: Calcified annulus. There was trivial regurgitation. - Left atrium: Moderately dilated. - Right atrium: Suggestion of possible mass in the RA, however, not   seen in all views and not seen on contrast views- this is likely   artifact. Normal RA size. - Tricuspid valve: There was trivial regurgitation. - Pulmonary arteries: PA peak pressure: 26 mm Hg (S). - Inferior vena cava: The vessel was normal in size. The   respirophasic diameter changes were in the normal range (>= 50%),   consistent with normal central venous pressure.  Impressions:  - LVEF 55-60%, normal wall thickness, diastolic dysfunction with   elevated LV filling pressure, no LV thrombus - however, there was   a suggestion of mass in the RA on apical 4 chamber views, but not   seen in other views- this is most likely an artifact. Not   visualized wtih contrast. Could consider TEE if further stroke   work-up is desired, however, this is the right atrium and would   not likely be implicated in stroke anyhow.  TEE IMPRESSION:   1. No LAA thrombus 2. Negative for PFO 3. No RA mass 4. LVEF 55-60%  RECOMMENDATIONS:    1.  No cardiac source of emboli noted.  Filed Weights   08/11/16 0331  Weight: 90 kg (198 lb 6.4 oz)     Microbiology: Recent Results  (from the past 240 hour(s))  MRSA PCR Screening     Status: None   Collection Time: 08/13/16 11:27 AM  Result Value Ref Range Status   MRSA by PCR NEGATIVE NEGATIVE Final    Comment:        The GeneXpert MRSA Assay (FDA approved for NASAL specimens only), is one component of a comprehensive MRSA colonization surveillance program. It is not intended to diagnose MRSA infection nor to guide or monitor treatment for MRSA infections.        Blood Culture    Component Value Date/Time   SDES BLOOD RIGHT HAND 07/20/2016 0033   SDES BLOOD RIGHT ANTECUBITAL 07/20/2016 0033   SPECREQUEST IN PEDIATRIC BOTTLE 2CC 07/20/2016 0033   SPECREQUEST IN PEDIATRIC BOTTLE 2CC 07/20/2016 0033   CULT  07/20/2016 0033    NO GROWTH 5 DAYS Performed at [facility], [place] [address] [place], [place] [address]    CULT  07/20/2016 0033    NO GROWTH 5 DAYS Performed at [facility], [place] [address], [place], [place] [address]    REPTSTATUS 07/25/2016 FINAL 07/20/2016 0033   REPTSTATUS 07/25/2016 FINAL 07/20/2016 0033      Labs: Results for orders placed or performed during the hospital encounter of 08/10/16 (from the past 48 hour(s))  MRSA PCR Screening     Status: None   Collection Time: 08/13/16 11:27 AM  Result Value Ref Range   MRSA by PCR NEGATIVE NEGATIVE    Comment:        The GeneXpert MRSA Assay (FDA approved for NASAL specimens only), is one component of a comprehensive MRSA colonization surveillance program. It is not intended to diagnose MRSA infection nor to guide or monitor treatment for MRSA infections.   Comprehensive metabolic panel     Status: Abnormal   Collection Time: 08/14/16  4:08 AM  Result Value Ref Range   Sodium 141 135 - 145 mmol/L   Potassium 4.3 3.5 - 5.1 mmol/L   Chloride 108 101 - 111 mmol/L   CO2 25 22 - 32 mmol/L   Glucose, Bld 117 (H) 65 - 99 mg/dL   BUN 10 6 - 20 mg/dL   Creatinine, Ser 0.85 0.44 - 1.00 mg/dL   Calcium 9.2 8.9 - 10.3  mg/dL   Total Protein 6.0 (L) 6.5 - 8.1 g/dL   Albumin 3.4 (L) 3.5 - 5.0 g/dL   AST 27 15 - 41 U/L   ALT 19 14 - 54 U/L   Alkaline Phosphatase 98 38 - 126 U/L   Total Bilirubin 0.5 0.3 - 1.2 mg/dL   GFR calc non Af Amer >60 >60 mL/min   GFR calc Af Amer >60 >60 mL/min    Comment: (NOTE) The eGFR has been calculated using the CKD EPI equation. This calculation has not been validated in all clinical situations. eGFR's persistently <60 mL/min signify possible Chronic Kidney Disease.    Anion gap 8 5 - 15  CBC     Status: Abnormal   Collection Time: 08/14/16  4:08 AM  Result Value Ref Range   WBC 7.7 4.0 - 10.5 K/uL   RBC 4.36 3.87 - 5.11 MIL/uL   Hemoglobin 11.8 (L) 12.0 - 15.0 g/dL   HCT 37.8 36.0 - 46.0 %   MCV 86.7 78.0 - 100.0 fL   MCH 27.1 26.0 - 34.0 pg   MCHC 31.2 30.0 - 36.0 g/dL   RDW 16.0 (H) 11.5 - 15.5 %   Platelets 157 150 - 400 K/uL  Glucose, capillary     Status: Abnormal   Collection Time: 08/14/16  8:28 AM  Result Value Ref Range   Glucose-Capillary 120 (H) 65 - 99 mg/dL     Lipid Panel     Component Value Date/Time   CHOL 187 08/11/2016 0350   TRIG 194 (H) 08/11/2016 0350   HDL 58 08/11/2016 0350   CHOLHDL 3.2 08/11/2016 0350   VLDL 39 08/11/2016 0350   LDLCALC 90 08/11/2016 0350     Lab Results  Component Value Date   HGBA1C 6.7 (H) 08/11/2016   HGBA1C 6.5 (H) 07/20/2016   HGBA1C 6.4 (H) 12/09/2012        HPI :   81 y.o.femalewith a PMHx anxiety, depression, HTN, GERD, obesity, who presents to the Emergency Department complaining of persistent, worsening anxiousness beginning this early this morning. She notes associated shortness of breath and speech difficulty which she describes as being unable to  form the words that she is thinking. Per daughter, she also has been commenting on several objects on the walls around her which have not actually been there. Per family, pt has a h/o panic attacks which her symptoms today are consistent with;  however, her prior symptoms associated with this have not previously been associated with speech difficulty.Pt is currently followed by psychotherapist and she has been compliant with her anti-anxiety medications recently.CT head at [facility] was negative for acute abnormality. She was transferred to [facility] for stroke/TIA work up.    HOSPITAL COURSE:   1. AMS , expressive aphasia ? r/o TIA Tele NSR MRIbrain  - Negative for acute infarct. MRA- Decreased signal in left MCA branches which could be due to stenosis or artifact. EEGThis is a normal EEG for the patients stated age Carotid ultrasound 1-39% ICA plaquing. Vertebral artery flow is antegrade Cardiac echo  LV EF: 55% - 60%, mass in the RA on apical 4 chamber views, TEE pending Neurology was consulted  No antithromboticprior to admission, now on clopidogrel 75 mg daily LDL-90, triglycerides 194-continue statin Hemoglobin A1c6.7 Telemetry shows normal sinus rhythm Venous Doppler due to recent surgery-abnormal d-dimer, no pulmonary symptoms, low suspicion for PE [no cardiopulmonary symptoms]. Venous Doppler negative for DVT TEE scheduled for 4/4 was negative ,results as above   2. Hypercalcemia, likely slightly dehydrated, albumin okay, 4.3, calcium now 9.2   3. Abdominal firmness KUB unremarkable  4. Hypertension Cont current bp medication   5. Anemia  Hemoglobin stable 11.8 prior to discharge  6. Hyperglycemia Hemoglobin A1c 6.7, on metformin  7.Hypothyroidism-TSH  2.66, 3 weeks ago, continue Synthroid  8.Hypokalemia-repleted, Recheck BMP as outpatient  9.Depression/anxiety, stable, continuedremeron  10.Bladder spasms,stable, continuedoxybutynin  11. Constipation, senna and miralax  12. Obesity, Body mass index is 42.93 kg/m., recommend weight loss, diet and exercise as appropriate     Discharge Exam:   Blood pressure (!) 167/72, pulse 76, temperature 98.6 F (37 C), temperature source Oral,  resp. rate 18, height 4' 9" (1.448 m), weight 90 kg (198 lb 6.4 oz), SpO2 98 %.  Cardiovascular: Normal rate, regular rhythm and normal heart sounds.   No murmur heard. Pulmonary/Chest: Effort normal and breath sounds normal. No respiratory distress. She has no wheezes. She has no rales.  Abdominal: Soft. She exhibits no distension. There is no tenderness. There is no rebound and no guarding.  Musculoskeletal: Normal range of motion. She exhibits no edema or tenderness.  Neurological: She is alert and oriented to person, place, and time    Follow-up Information    Primary care provider. Call in 2 day(s).   Why:  To make follow-up appointment, follow with PCP in 3-5 days        [facility] Follow up.   Why:  home health services arranged, office will call and setup home visits Contact information: [place] [address] [phone]           Signed: Reyne Dumas 08/14/2016, 9:56 AM        Time spent >45 mins

## 2016-08-14 NOTE — OR Nursing (Signed)
Discussed with patient and her daughter who is an Char[employer] fundraiser about my sedation drugs and the use of versed.  Understanding her intolerance to Valium the patient and daughter wanted the use of versed during the procedure.  Dr. Rennis Golden is aware.

## 2016-08-15 ENCOUNTER — Encounter (HOSPITAL_COMMUNITY): Payer: Self-pay | Admitting: Internal Medicine

## 2016-08-20 ENCOUNTER — Telehealth: Payer: Self-pay | Admitting: Neurology

## 2016-08-20 NOTE — Telephone Encounter (Signed)
Rn spoke with Trey Paula at [facility]. Rn gave Trey Paula the verbal order per Dr.Sethi for 2 visits for therapy. Pt was seen as a consult with Dr. Pearlean Brownie. Pt last saw Dr. Anne Hahn in 2016.

## 2016-08-20 NOTE — Telephone Encounter (Addendum)
Received call from [facility] @ [facility]. He wanted orders for therapy for pt. Pt was seen by Dr.Sethi in the hospital for possible stroke. Pt sees Dr.Willis for her neurology conditions. Message will be sent to Dr.Sethi nurse.

## 2016-08-28 ENCOUNTER — Telehealth: Payer: Self-pay

## 2016-08-28 NOTE — Telephone Encounter (Signed)
Spoke with Mrs. Jody Porter, she has transferred PCP.  She is not longer a patient with [facility]. Says she has removed [facility] from her insurance card.cdavis

## 2016-08-28 NOTE — Telephone Encounter (Signed)
Patient says she transferred PCP.  Stated she needed to be closer to her home.Jody Porter

## 2016-09-11 ENCOUNTER — Telehealth: Payer: Self-pay | Admitting: Neurology

## 2016-09-11 NOTE — Telephone Encounter (Signed)
Left vm for CT with home health nurse. Pt blood pressure is 144/92.Pt was last seen by Dr. Anne Hahn in 2016 for memory issues. Pt was admitted to the hospital on 08/2016 for possible stroke but per Dr. Pearlean Brownie notes it was a pain attack. Pt has not schedule a hospital follow up. Rn left vm that pt sees other MD and needs to establish with PCP.

## 2016-09-11 NOTE — Telephone Encounter (Signed)
Home Health Nurse [facility] called  Just left pt home, Blood pressure 144/92 elevated. Pt is not on medication for this and does not have a PCP. CJ can be reached at [phone]

## 2016-09-23 ENCOUNTER — Ambulatory Visit: Payer: Self-pay | Admitting: Orthopedic Surgery

## 2016-09-23 NOTE — Patient Instructions (Signed)
Jody Porter  09/23/2016   Your procedure is scheduled on: 10/02/2016    Report to [facility] Main  Entrance   Report to admitting at  800 AM   Call this number if you have problems the morning of surgery  [phone]   Remember: ONLY 1 PERSON MAY GO WITH YOU TO SHORT STAY TO GET  READY MORNING OF YOUR SURGERY.  Do not eat food or drink liquids :After Midnight.     Take these medicines the morning of surgery with A SIP OF WATER: Cymbalta, nexium, synthroid, Ativan, Namenda, Ditropan  DO NOT TAKE ANY DIABETIC MEDICATIONS DAY OF YOUR SURGERY                               You may not have any metal on your body including hair pins and              piercings  Do not wear jewelry, make-up, lotions, powders or perfumes, deodorant             Do not wear nail polish.  Do not shave  48 hours prior to surgery.              Do not bring valuables to the hospital. [facility] IS NOT             RESPONSIBLE   FOR VALUABLES.  Contacts, dentures or bridgework may not be worn into surgery.  Leave suitcase in the car. After surgery it may be brought to your room.                     Please read over the following fact sheets you were given: _____________________________________________________________________             [facility] - Preparing for [facility] Before surgery, you can play an important role.  Because skin is not sterile, your skin needs to be as free of germs as possible.  You can reduce the number of germs on your skin by washing with CHG (chlorahexidine gluconate) soap before surgery.  CHG is an antiseptic cleaner which kills germs and bonds with the skin to continue killing germs even after washing. Please DO NOT use if you have an allergy to CHG or antibacterial soaps.  If your skin becomes reddened/irritated stop using the CHG and inform your nurse when you arrive at Short Stay. Do not shave (including legs and underarms) for at least 48 hours  prior to the first CHG shower.  You may shave your face/neck. Please follow these instructions carefully:  1.  Shower with CHG Soap the night before surgery and the  morning of Surgery.  2.  If you choose to wash your hair, wash your hair first as usual with your  normal  shampoo.  3.  After you shampoo, rinse your hair and body thoroughly to remove the  shampoo.                           4.  Use CHG as you would any other liquid soap.  You can apply chg directly  to the skin and wash                       Gently with a scrungie or  clean washcloth.  5.  Apply the CHG Soap to your body ONLY FROM THE NECK DOWN.   Do not use on face/ open                           Wound or open sores. Avoid contact with eyes, ears mouth and genitals (private parts).                       Wash face,  Genitals (private parts) with your normal soap.             6.  Wash thoroughly, paying special attention to the area where your surgery  will be performed.  7.  Thoroughly rinse your body with warm water from the neck down.  8.  DO NOT shower/wash with your normal soap after using and rinsing off  the CHG Soap.                9.  Pat yourself dry with a clean towel.            10.  Wear clean pajamas.            11.  Place clean sheets on your bed the night of your first shower and do not  sleep with pets. Day of Surgery : Do not apply any lotions/deodorants the morning of surgery.  Please wear clean clothes to the hospital/surgery center.  FAILURE TO FOLLOW THESE INSTRUCTIONS MAY RESULT IN THE CANCELLATION OF YOUR SURGERY PATIENT SIGNATURE_________________________________  NURSE SIGNATURE__________________________________  ________________________________________________________________________  WHAT IS A BLOOD TRANSFUSION? Blood Transfusion Information  A transfusion is the replacement of blood or some of its parts. Blood is made up of multiple cells which provide different functions.  Red blood cells carry  oxygen and are used for blood loss replacement.  White blood cells fight against infection.  Platelets control bleeding.  Plasma helps clot blood.  Other blood products are available for specialized needs, such as hemophilia or other clotting disorders. BEFORE THE TRANSFUSION  Who gives blood for transfusions?   Healthy volunteers who are fully evaluated to make sure their blood is safe. This is blood bank blood. Transfusion therapy is the safest it has ever been in the practice of medicine. Before blood is taken from a donor, a complete history is taken to make sure that person has no history of diseases nor engages in risky social behavior (examples are intravenous drug use or sexual activity with multiple partners). The donor's travel history is screened to minimize risk of transmitting infections, such as malaria. The donated blood is tested for signs of infectious diseases, such as HIV and hepatitis. The blood is then tested to be sure it is compatible with you in order to minimize the chance of a transfusion reaction. If you or a relative donates blood, this is often done in anticipation of surgery and is not appropriate for emergency situations. It takes many days to process the donated blood. RISKS AND COMPLICATIONS Although transfusion therapy is very safe and saves many lives, the main dangers of transfusion include:   Getting an infectious disease.  Developing a transfusion reaction. This is an allergic reaction to something in the blood you were given. Every precaution is taken to prevent this. The decision to have a blood transfusion has been considered carefully by your caregiver before blood is given. Blood is not given unless the benefits outweigh the risks. AFTER THE TRANSFUSION  Right after receiving a blood transfusion, you will usually feel much better and more energetic. This is especially true if your red blood cells have gotten low (anemic). The transfusion raises the  level of the red blood cells which carry oxygen, and this usually causes an energy increase.  The nurse administering the transfusion will monitor you carefully for complications. HOME CARE INSTRUCTIONS  No special instructions are needed after a transfusion. You may find your energy is better. Speak with your caregiver about any limitations on activity for underlying diseases you may have. SEEK MEDICAL CARE IF:   Your condition is not improving after your transfusion.  You develop redness or irritation at the intravenous (IV) site. SEEK IMMEDIATE MEDICAL CARE IF:  Any of the following symptoms occur over the next 12 hours:  Shaking chills.  You have a temperature by mouth above 102 F (38.9 C), not controlled by medicine.  Chest, back, or muscle pain.  People around you feel you are not acting correctly or are confused.  Shortness of breath or difficulty breathing.  Dizziness and fainting.  You get a rash or develop hives.  You have a decrease in urine output.  Your urine turns a dark color or changes to pink, red, or brown. Any of the following symptoms occur over the next 10 days:  You have a temperature by mouth above 102 F (38.9 C), not controlled by medicine.  Shortness of breath.  Weakness after normal activity.  The white part of the eye turns yellow (jaundice).  You have a decrease in the amount of urine or are urinating less often.  Your urine turns a dark color or changes to pink, red, or brown. Document Released: 04/26/2000 Document Revised: 07/22/2011 Document Reviewed: 12/14/2007 ExitCare Patient Information 2014 [place].  _______________________________________________________________________  Incentive Spirometer  An incentive spirometer is a tool that can help keep your lungs clear and active. This tool measures how well you are filling your lungs with each breath. Taking long deep breaths may help reverse or decrease the chance of  developing breathing (pulmonary) problems (especially infection) following:  A long period of time when you are unable to move or be active. BEFORE THE PROCEDURE   If the spirometer includes an indicator to show your best effort, your nurse or respiratory therapist will set it to a desired goal.  If possible, sit up straight or lean slightly forward. Try not to slouch.  Hold the incentive spirometer in an upright position. INSTRUCTIONS FOR USE  1. Sit on the edge of your bed if possible, or sit up as far as you can in bed or on a chair. 2. Hold the incentive spirometer in an upright position. 3. Breathe out normally. 4. Place the mouthpiece in your mouth and seal your lips tightly around it. 5. Breathe in slowly and as deeply as possible, raising the piston or the ball toward the top of the column. 6. Hold your breath for 3-5 seconds or for as long as possible. Allow the piston or ball to fall to the bottom of the column. 7. Remove the mouthpiece from your mouth and breathe out normally. 8. Rest for a few seconds and repeat Steps 1 through 7 at least 10 times every 1-2 hours when you are awake. Take your time and take a few normal breaths between deep breaths. 9. The spirometer may include an indicator to show your best effort. Use the indicator as a goal to work toward during each repetition. 10. After  each set of 10 deep breaths, practice coughing to be sure your lungs are clear. If you have an incision (the cut made at the time of surgery), support your incision when coughing by placing a pillow or rolled up towels firmly against it. Once you are able to get out of bed, walk around indoors and cough well. You may stop using the incentive spirometer when instructed by your caregiver.  RISKS AND COMPLICATIONS  Take your time so you do not get dizzy or light-headed.  If you are in pain, you may need to take or ask for pain medication before doing incentive spirometry. It is harder to take a  deep breath if you are having pain. AFTER USE  Rest and breathe slowly and easily.  It can be helpful to keep track of a log of your progress. Your caregiver can provide you with a simple table to help with this. If you are using the spirometer at home, follow these instructions: [place] IF:   You are having difficultly using the spirometer.  You have trouble using the spirometer as often as instructed.  Your pain medication is not giving enough relief while using the spirometer.  You develop fever of 100.5 F (38.1 C) or higher. SEEK IMMEDIATE MEDICAL CARE IF:   You cough up bloody sputum that had not been present before.  You develop fever of 102 F (38.9 C) or greater.  You develop worsening pain at or near the incision site. MAKE SURE YOU:   Understand these instructions.  Will watch your condition.  Will get help right away if you are not doing well or get worse. Document Released: 09/09/2006 Document Revised: 07/22/2011 Document Reviewed: 11/10/2006 [facility] Patient Information 2014 [place], [place].   ________________________________________________________________________

## 2016-09-24 ENCOUNTER — Encounter (HOSPITAL_COMMUNITY)
Admission: RE | Admit: 2016-09-24 | Discharge: 2016-09-24 | Disposition: A | Discharge status: Home / Self Care | Payer: Medicare (Managed Care) | Source: Ambulatory Visit | Attending: Orthopedic Surgery | Admitting: Orthopedic Surgery

## 2016-09-24 ENCOUNTER — Encounter (HOSPITAL_COMMUNITY): Payer: Self-pay

## 2016-09-24 DIAGNOSIS — Z01812 Encounter for preprocedural laboratory examination: Secondary | ICD-10-CM | POA: Insufficient documentation

## 2016-09-24 DIAGNOSIS — M1611 Unilateral primary osteoarthritis, right hip: Secondary | ICD-10-CM | POA: Diagnosis not present

## 2016-09-24 HISTORY — DX: Prediabetes: R73.03

## 2016-09-24 HISTORY — DX: Sleep apnea, unspecified: G47.30

## 2016-09-24 HISTORY — DX: Dyspnea, unspecified: R06.00

## 2016-09-24 HISTORY — DX: Local infection of the skin and subcutaneous tissue, unspecified: L08.9

## 2016-09-24 LAB — NO BLOOD PRODUCTS

## 2016-09-24 LAB — COMPREHENSIVE METABOLIC PANEL
ALK PHOS: 127 U/L — AB (ref 38–126)
ALT: 22 U/L (ref 14–54)
AST: 30 U/L (ref 15–41)
Albumin: 3.9 g/dL (ref 3.5–5.0)
Anion gap: 10 (ref 5–15)
BILIRUBIN TOTAL: 0.4 mg/dL (ref 0.3–1.2)
BUN: 15 mg/dL (ref 6–20)
CALCIUM: 9.3 mg/dL (ref 8.9–10.3)
CHLORIDE: 103 mmol/L (ref 101–111)
CO2: 25 mmol/L (ref 22–32)
Creatinine, Ser: 0.87 mg/dL (ref 0.44–1.00)
GFR calc Af Amer: >60 mL/min (ref >60)
Glucose, Bld: 129 mg/dL — ABNORMAL HIGH (ref 65–99)
Potassium: 4.1 mmol/L (ref 3.5–5.1)
Sodium: 138 mmol/L (ref 135–145)
Total Protein: 7 g/dL (ref 6.5–8.1)

## 2016-09-24 LAB — CBC
HEMATOCRIT: 41.1 % (ref 36.0–46.0)
Hemoglobin: 13 g/dL (ref 12.0–15.0)
MCH: 27.4 pg (ref 26.0–34.0)
MCHC: 31.6 g/dL (ref 30.0–36.0)
MCV: 86.5 fL (ref 78.0–100.0)
PLATELETS: 239 10*3/uL (ref 150–400)
RBC: 4.75 MIL/uL (ref 3.87–5.11)
RDW: 15.5 % (ref 11.5–15.5)
WBC: 7.8 10*3/uL (ref 4.0–10.5)

## 2016-09-24 LAB — BASIC METABOLIC PANEL
BUN: 15 mg/dL (ref 4–21)
Creatinine: 0.9 mg/dL (ref 0.5–1.1)
Glucose: 129 mg/dL
Potassium: 4.1 mmol/L (ref 3.4–5.3)
Sodium: 138 mmol/L (ref 137–147)

## 2016-09-24 LAB — PROTIME-INR
INR: 1.14
PROTHROMBIN TIME: 14.7 s (ref 11.4–15.2)
PROTIME: 14.7 s — AB (ref 10.0–13.8)

## 2016-09-24 LAB — SURGICAL PCR SCREEN
MRSA, PCR: NEGATIVE
Staphylococcus aureus: POSITIVE — AB

## 2016-09-24 LAB — CBC AND DIFFERENTIAL
HCT: 41 % (ref 36–46)
Hemoglobin: 13 g/dL (ref 12.0–16.0)
PLATELETS: 239 10*3/uL (ref 150–399)
WBC: 7.8 10*3/mL

## 2016-09-24 LAB — HEPATIC FUNCTION PANEL: BILIRUBIN, TOTAL: 0.4 mg/dL

## 2016-09-24 LAB — APTT: aPTT: 26 seconds (ref 24–36)

## 2016-09-24 NOTE — Progress Notes (Signed)
Blood Refusal Consent form faxed to Blood Bank with confirmation received and Blood Refusal consent form along with patient's Advance directive faxed to Dr Lequita HaltAluisio.

## 2016-09-24 NOTE — Progress Notes (Signed)
EKG-08/12/16-epic cxr-08/11/16-epic  ECHO-08/14/16-epic

## 2016-09-24 NOTE — Progress Notes (Signed)
Patient with recent history of MRSA infection to finger.  See encounters in epic from 07/2016.  FYI.

## 2016-09-25 LAB — HEMOGLOBIN A1C
HEMOGLOBIN A1C: 6.7 % — AB (ref 4.8–5.6)
MEAN PLASMA GLUCOSE: 146 mg/dL

## 2016-09-25 NOTE — Progress Notes (Signed)
Called ; no answer. RN LVMM with RN name and direct number ,instructing patient to call back for information on lab results collected at pre-op appt. Will continue F/U as needed. Surgery is 10-02-16

## 2016-10-01 ENCOUNTER — Ambulatory Visit: Payer: Self-pay | Admitting: Orthopedic Surgery

## 2016-10-01 NOTE — H&P (Signed)
Jody LaityJessie L Porter DOB: 11/07/1935 Widowed / Language: Jody PondsEnglish / Race: White Female Date of Admission:  10/02/16 CC:  Right Hip pain History of Present Illness  The patient is a 81 year old female who comes in for a preoperative History and Physical. The patient is scheduled for a right total hip arthroplasty (anterior) to be performed by Dr. Gus RankinFrank V. Aluisio, MD at [facility] on 10-02-2016. The patient is a 81 year old female who presented for follow up of their hip. The patient is being followed for their right hip pain. They are months out from from IA injection. Symptoms reported include: pain (right hip to knee), aching and pain with weightbearing. The patient feels that they are doing poorly and report their pain level to be moderate to severe. The following medication has been used for pain control: Tylenol. The patient has reported improvement of their symptoms with: Cortisone injections (helped some). Jody Porter was seen months out from her intra-articular injection of the right hip. She was accompanied by her son Tinnie GensJeffrey. She complains of pain in and about the right hip down toward the knee and also pain with weightbearing and it wants to buckle and give way. The soreness has been preventing her from using the leg and she is not as active. Walking is becoming more impossible over the past couple of months and her biggest concern is falling at this point. She has not been on any medications for the past 24 hours. It is not unbearable. She did have some hydrocodone in the past. She has had both knees replaced and the son thinks it is coming from her back and it is very difficult trying to control the right leg. The most recent injection did help, lasted for a little while, but not a 100% improvement. Unfortunately, the injection is wearing off and walking has become more difficult. Balance is a big issue with her. She is very concerned about falling. She does have pain at night though,  which will wake her up. She uses a rollator walker. Also uses a cane and walker around the house. Again, her biggest concern is falling. She would like to get the hip fixed. They have been treated conservatively in the past for the above stated problem and despite conservative measures, they continue to have progressive pain and severe functional limitations and dysfunction. They have failed non-operative management including home exercise, medications, and injections. It is felt that they would benefit from undergoing total joint replacement. Risks and benefits of the procedure have been discussed with the patient and they elect to proceed with surgery. There are no active contraindications to surgery such as ongoing infection or rapidly progressive neurological disease.    Problem List/Past Medical Acute pain of right knee (M25.561)  Degenerative lumbar disc (M51.36)  Pain of right hip joint (M25.551)  Aftercare following right knee joint replacement surgery (Z47.1)  Primary osteoarthritis of right hip (M16.11)  TIA (transient ischemic attack) (G45.9)  Anxiety Disorder  Chronic Pain  Depression  Gastroesophageal Reflux Disease  Hypothyroidism  Sleep Apnea    Allergies  Penicillin [06/04/1999]: Rash. Aspirin [06/04/1999]: Rapid pulse. Valium *ANTIANXIETY AGENTS*  Confusion, Mind-altering  Family History  Depression  child Drug / Alcohol Addiction  Mother. child First Degree Relatives  reported Osteoarthritis  Mother. Rheumatoid Arthritis  Father, Sister. child  Social History Children  4 Current work status  retired Scientist, physiologicalxercise  Exercises never; does other Marital status  widowed Never consumed alcohol  01/31/2016: Never consumed  alcohol No history of drug/alcohol rehab  Not under pain contract  Number of flights of stairs before winded  less than 1 Tobacco / smoke exposure  01/31/2016: no Tobacco use  Never smoker. 01/31/2016  Medication  History Pantoprazole Sodium (40MG  Tablet DR, Oral) Active. Naproxen (Oral) Specific strength unknown - Active. Mineral preparation Active. (a mail order medication that pertains to hemp and marijuana; she states she takes this for anxiety) Tylenol Extra Strength (Oral) Specific strength unknown - Active. Hair, Skin, and Nails Supplement Active. RaNITidine HCl (300MG  Tablet, Oral daily with evening meal) Active. Sucralfate (1GM Tablet, Oral PO BID) Active. Mirtazapine (30MG  Tablet, Oral PO QHS) Active. QUEtiapine Fumarate (25MG  Tablet, Oral PO QHS) Active. Levothroid ( Tablet, Oral PO QAM on empty stomach) Active. DULoxetine HCl (30MG  Capsule DR Part, Oral daily) Active. Memantine HCl (10MG  Tablet, Oral PO BID) Active. Clopidogrel Bisulfate (75MG  Tablet, Oral PO Daily) Active. Oxybutynin Chloride ER (5MG  Tablet ER 24HR, Oral PO Daily) Active. Hydrocodone-Acetaminophen (5-325MG  Tablet, Oral 1-2 every 8 hours as needed for paoin) Active. LORazepam (0.5MG  Tablet, Oral PO Q 8 hours as needed) Active. Fish Oil (Oral) Specific strength unknown - Active. Vitamin D3 (Oral) Specific strength unknown - Active.   Past Surgical History Appendectomy  Carpal Tunnel Repair  bilateral Cataract Surgery  bilateral Cesarean Delivery  3 or more times Gallbladder Surgery  open Thyroidectomy; Subtotal  Total Knee Replacement  bilateral   Review of Systems  General Not Present- Chills, Fatigue, Fever, Memory Loss, Night Sweats, Weight Gain and Weight Loss. Skin Not Present- Eczema, Hives, Itching, Lesions and Rash. HEENT Not Present- Dentures, Double Vision, Headache, Hearing Loss, Tinnitus and Visual Loss. Respiratory Not Present- Allergies, Chronic Cough, Coughing up blood, Shortness of breath at rest and Shortness of breath with exertion. Cardiovascular Not Present- Chest Pain, Difficulty Breathing Lying Down, Murmur, Palpitations, Racing/skipping heartbeats and  Swelling. Gastrointestinal Not Present- Abdominal Pain, Bloody Stool, Constipation, Diarrhea, Difficulty Swallowing, Heartburn, Jaundice, Loss of appetitie, Nausea and Vomiting. Female Genitourinary Not Present- Blood in Urine, Discharge, Flank Pain, Incontinence, Painful Urination, Urgency, Urinary frequency, Urinary Retention, Urinating at Night and Weak urinary stream. Musculoskeletal Present- Joint Pain. Not Present- Back Pain, Joint Swelling, Morning Stiffness, Muscle Pain, Muscle Weakness and Spasms. Neurological Not Present- Blackout spells, Difficulty with balance, Dizziness, Paralysis, Tremor and Weakness. Psychiatric Not Present- Insomnia.  Vitals Weight: 192 lb Height: 57in Weight was reported by patient. Height was reported by patient. Body Surface Area: 1.77 m Body Mass Index: 41.55 kg/m  Pulse: 104 (Regular)  BP: 138/88 (Sitting, Right Arm, Standard)       Physical Exam General Mental Status -Alert, cooperative and good historian. General Appearance-pleasant, Not in acute distress. Orientation-Oriented X3. Build & Nutrition-Well nourished and Well developed.  Head and Neck Head-normocephalic, atraumatic . Neck Global Assessment - supple, no bruit auscultated on the right, no bruit auscultated on the left.  Eye Vision-Wears corrective lenses. Pupil - Bilateral-Regular and Round. Motion - Bilateral-EOMI.  Chest and Lung Exam Auscultation Breath sounds - clear at anterior chest wall and clear at posterior chest wall. Adventitious sounds - No Adventitious sounds.  Cardiovascular Auscultation Rhythm - Regular rate and rhythm. Heart Sounds - S1 WNL and S2 WNL. Murmurs & Other Heart Sounds - Auscultation of the heart reveals - No Murmurs.  Abdomen Palpation/Percussion Tenderness - Abdomen is non-tender to palpation. Rigidity (guarding) - Abdomen is soft. Auscultation Auscultation of the abdomen reveals - Bowel sounds normal.  Female  Genitourinary Note: Not done, not pertinent to present  illness   Musculoskeletal Note: Right hip is examined. She has got some guarded flexion of about 110 degrees, internal rotation about 20, external rotation about 25, abduction is limited. Examined her in the chair, but she does have pain noted on internal and external rotation. There is no radicular component, no numbness or tingling.  X-RAYS No new x-rays are taken today. We had x-rays recently a couple of months ago, which show significant arthritic changes with essentially bone on bone in the medial femoral and acetabular component with some femoral head spurring. Lateral view confirms this also with the spurring on the femoral head.   Assessment & Plan Primary osteoarthritis of right hip (M16.11)  Note:Surgical Plans: Right Total Hip Replacement - Anterior Approach  Disposition: Home with assistance, HHPT  PCP: Dr. Anson Crofts  Topical TXA - History of TIA  Anesthesia Issues: None  Patient was instructed on what medications to stop prior to surgery.  Signed electronically by Lauraine Rinne, III PA-C

## 2016-10-02 ENCOUNTER — Inpatient Hospital Stay (HOSPITAL_COMMUNITY): Payer: Medicare Other | Admitting: Anesthesiology

## 2016-10-02 ENCOUNTER — Inpatient Hospital Stay (HOSPITAL_COMMUNITY): Payer: Medicare Other

## 2016-10-02 ENCOUNTER — Inpatient Hospital Stay (HOSPITAL_COMMUNITY)
Admission: RE | Admit: 2016-10-02 | Discharge: 2016-10-04 | DRG: 470 | Disposition: A | Discharge status: Skilled Nursing Facility | Payer: Medicare Other | Source: Ambulatory Visit | Attending: Orthopedic Surgery | Admitting: Orthopedic Surgery

## 2016-10-02 ENCOUNTER — Encounter (HOSPITAL_COMMUNITY): Payer: Self-pay

## 2016-10-02 ENCOUNTER — Encounter (HOSPITAL_COMMUNITY)
Admission: RE | Disposition: A | Discharge status: Skilled Nursing Facility | Payer: Self-pay | Source: Ambulatory Visit | Attending: Orthopedic Surgery

## 2016-10-02 DIAGNOSIS — Z880 Allergy status to penicillin: Secondary | ICD-10-CM

## 2016-10-02 DIAGNOSIS — G4730 Sleep apnea, unspecified: Secondary | ICD-10-CM | POA: Diagnosis present

## 2016-10-02 DIAGNOSIS — Z96649 Presence of unspecified artificial hip joint: Secondary | ICD-10-CM

## 2016-10-02 DIAGNOSIS — M1611 Unilateral primary osteoarthritis, right hip: Principal | ICD-10-CM | POA: Diagnosis present

## 2016-10-02 DIAGNOSIS — E039 Hypothyroidism, unspecified: Secondary | ICD-10-CM | POA: Diagnosis present

## 2016-10-02 DIAGNOSIS — Z7982 Long term (current) use of aspirin: Secondary | ICD-10-CM

## 2016-10-02 DIAGNOSIS — Z888 Allergy status to other drugs, medicaments and biological substances status: Secondary | ICD-10-CM

## 2016-10-02 DIAGNOSIS — M169 Osteoarthritis of hip, unspecified: Secondary | ICD-10-CM | POA: Diagnosis present

## 2016-10-02 HISTORY — PX: TOTAL HIP ARTHROPLASTY: SHX124

## 2016-10-02 LAB — GLUCOSE, CAPILLARY: GLUCOSE-CAPILLARY: 133 mg/dL — AB (ref 65–99)

## 2016-10-02 SURGERY — ARTHROPLASTY, HIP, TOTAL, ANTERIOR APPROACH
Anesthesia: Spinal | Site: Hip | Laterality: Right

## 2016-10-02 MED ORDER — BUPIVACAINE LIPOSOME 1.3 % IJ SUSP
20.0000 mL | Freq: Once | INTRAMUSCULAR | Status: DC
  Filled 2016-10-02: qty 20

## 2016-10-02 MED ORDER — HYDROMORPHONE HCL 1 MG/ML IJ SOLN
INTRAMUSCULAR | Status: AC
  Filled 2016-10-02: qty 0.5

## 2016-10-02 MED ORDER — ACETAMINOPHEN 10 MG/ML IV SOLN
1000.0000 mg | Freq: Once | INTRAVENOUS | Status: AC
  Administered 2016-10-02: 1000 mg via INTRAVENOUS

## 2016-10-02 MED ORDER — BUPIVACAINE HCL (PF) 0.25 % IJ SOLN
INTRAMUSCULAR | Status: AC
  Filled 2016-10-02: qty 30

## 2016-10-02 MED ORDER — SODIUM CHLORIDE 0.9 % IV SOLN
INTRAVENOUS | Status: DC
  Administered 2016-10-02: 14:00:00 via INTRAVENOUS

## 2016-10-02 MED ORDER — PROPOFOL 10 MG/ML IV BOLUS
INTRAVENOUS | Status: AC
  Filled 2016-10-02: qty 40

## 2016-10-02 MED ORDER — LACTATED RINGERS IV SOLN
INTRAVENOUS | Status: DC
  Administered 2016-10-02: 1000 mL via INTRAVENOUS
  Administered 2016-10-02: 12:00:00 via INTRAVENOUS

## 2016-10-02 MED ORDER — VANCOMYCIN HCL IN DEXTROSE 1-5 GM/200ML-% IV SOLN
INTRAVENOUS | Status: AC
  Administered 2016-10-02: 1000 mg via INTRAVENOUS
  Filled 2016-10-02: qty 200

## 2016-10-02 MED ORDER — HYDROMORPHONE HCL 1 MG/ML IJ SOLN
INTRAMUSCULAR | Status: AC
  Administered 2016-10-02: 0.5 mg via INTRAVENOUS
  Filled 2016-10-02: qty 0.5

## 2016-10-02 MED ORDER — ACETAMINOPHEN 650 MG RE SUPP: 650.0000 mg | Freq: Four times a day (QID) | RECTAL | Status: DC | PRN

## 2016-10-02 MED ORDER — FLEET ENEMA 7-19 GM/118ML RE ENEM: 1.0000 | ENEMA | Freq: Once | RECTAL | Status: DC | PRN

## 2016-10-02 MED ORDER — LIDOCAINE 2% (20 MG/ML) 5 ML SYRINGE
INTRAMUSCULAR | Status: AC
  Filled 2016-10-02: qty 5

## 2016-10-02 MED ORDER — METHOCARBAMOL 1000 MG/10ML IJ SOLN
500.0000 mg | Freq: Four times a day (QID) | INTRAMUSCULAR | Status: DC | PRN
  Administered 2016-10-02: 500 mg via INTRAVENOUS
  Filled 2016-10-02: qty 550

## 2016-10-02 MED ORDER — DEXAMETHASONE SODIUM PHOSPHATE 10 MG/ML IJ SOLN
INTRAMUSCULAR | Status: AC
  Filled 2016-10-02: qty 1

## 2016-10-02 MED ORDER — FENTANYL CITRATE (PF) 100 MCG/2ML IJ SOLN
50.0000 ug | INTRAMUSCULAR | Status: DC | PRN
  Administered 2016-10-02 (×2): 50 ug via INTRAVENOUS

## 2016-10-02 MED ORDER — LEVOTHYROXINE SODIUM 112 MCG PO TABS
112.0000 ug | ORAL_TABLET | Freq: Every day | ORAL | Status: DC
  Administered 2016-10-03 – 2016-10-04 (×2): 112 ug via ORAL
  Filled 2016-10-02 (×2): qty 1

## 2016-10-02 MED ORDER — QUETIAPINE FUMARATE 25 MG PO TABS
25.0000 mg | ORAL_TABLET | Freq: Every day | ORAL | Status: DC
  Administered 2016-10-02 – 2016-10-03 (×2): 25 mg via ORAL
  Filled 2016-10-02 (×2): qty 1

## 2016-10-02 MED ORDER — MEMANTINE HCL 10 MG PO TABS
10.0000 mg | ORAL_TABLET | Freq: Two times a day (BID) | ORAL | Status: DC
  Administered 2016-10-02 – 2016-10-04 (×4): 10 mg via ORAL
  Filled 2016-10-02 (×4): qty 1

## 2016-10-02 MED ORDER — ACETAMINOPHEN 325 MG PO TABS: 650.0000 mg | ORAL_TABLET | Freq: Four times a day (QID) | ORAL | Status: DC | PRN

## 2016-10-02 MED ORDER — MORPHINE SULFATE (PF) 4 MG/ML IV SOLN
1.0000 mg | INTRAVENOUS | Status: DC | PRN
  Administered 2016-10-02 – 2016-10-03 (×2): 1 mg via INTRAVENOUS
  Filled 2016-10-02 (×2): qty 1

## 2016-10-02 MED ORDER — BUPIVACAINE HCL (PF) 0.25 % IJ SOLN
INTRAMUSCULAR | Status: DC | PRN
  Administered 2016-10-02: 30 mL

## 2016-10-02 MED ORDER — MIRTAZAPINE 15 MG PO TABS
30.0000 mg | ORAL_TABLET | Freq: Every day | ORAL | Status: DC
  Administered 2016-10-02 – 2016-10-03 (×2): 30 mg via ORAL
  Filled 2016-10-02 (×2): qty 2

## 2016-10-02 MED ORDER — CLOPIDOGREL BISULFATE 75 MG PO TABS
75.0000 mg | ORAL_TABLET | Freq: Every day | ORAL | Status: DC
  Administered 2016-10-03 – 2016-10-04 (×2): 75 mg via ORAL
  Filled 2016-10-02 (×2): qty 1

## 2016-10-02 MED ORDER — FENTANYL CITRATE (PF) 100 MCG/2ML IJ SOLN
INTRAMUSCULAR | Status: AC
  Administered 2016-10-02: 50 ug via INTRAVENOUS
  Filled 2016-10-02: qty 2

## 2016-10-02 MED ORDER — TRANEXAMIC ACID 1000 MG/10ML IV SOLN
2000.0000 mg | Freq: Once | INTRAVENOUS | Status: DC
  Filled 2016-10-02: qty 20

## 2016-10-02 MED ORDER — METHOCARBAMOL 500 MG PO TABS
500.0000 mg | ORAL_TABLET | Freq: Four times a day (QID) | ORAL | Status: DC | PRN
  Administered 2016-10-03 – 2016-10-04 (×4): 500 mg via ORAL
  Filled 2016-10-02 (×4): qty 1

## 2016-10-02 MED ORDER — CHLORHEXIDINE GLUCONATE 4 % EX LIQD: 60.0000 mL | Freq: Once | CUTANEOUS | Status: DC

## 2016-10-02 MED ORDER — DOCUSATE SODIUM 100 MG PO CAPS
100.0000 mg | ORAL_CAPSULE | Freq: Two times a day (BID) | ORAL | Status: DC
  Administered 2016-10-02 – 2016-10-04 (×4): 100 mg via ORAL
  Filled 2016-10-02 (×4): qty 1

## 2016-10-02 MED ORDER — LORAZEPAM 0.5 MG PO TABS
0.5000 mg | ORAL_TABLET | Freq: Three times a day (TID) | ORAL | Status: DC
  Administered 2016-10-02 – 2016-10-04 (×7): 0.5 mg via ORAL
  Filled 2016-10-02 (×7): qty 1

## 2016-10-02 MED ORDER — ONDANSETRON HCL 4 MG/2ML IJ SOLN
4.0000 mg | Freq: Four times a day (QID) | INTRAMUSCULAR | Status: DC | PRN
  Administered 2016-10-04: 4 mg via INTRAVENOUS
  Filled 2016-10-02: qty 2

## 2016-10-02 MED ORDER — VANCOMYCIN HCL IN DEXTROSE 1-5 GM/200ML-% IV SOLN
1000.0000 mg | Freq: Once | INTRAVENOUS | Status: AC
  Administered 2016-10-02: 1000 mg via INTRAVENOUS

## 2016-10-02 MED ORDER — ACETAMINOPHEN 500 MG PO TABS
1000.0000 mg | ORAL_TABLET | Freq: Four times a day (QID) | ORAL | Status: AC
  Administered 2016-10-02 – 2016-10-03 (×4): 1000 mg via ORAL
  Filled 2016-10-02 (×4): qty 2

## 2016-10-02 MED ORDER — PROPOFOL 10 MG/ML IV BOLUS
INTRAVENOUS | Status: DC | PRN
  Administered 2016-10-02: 30 mg via INTRAVENOUS
  Administered 2016-10-02: 20 mg via INTRAVENOUS
  Administered 2016-10-02: 30 mg via INTRAVENOUS

## 2016-10-02 MED ORDER — BISACODYL 10 MG RE SUPP: 10.0000 mg | Freq: Every day | RECTAL | Status: DC | PRN

## 2016-10-02 MED ORDER — METOCLOPRAMIDE HCL 5 MG PO TABS: 5.0000 mg | ORAL_TABLET | Freq: Three times a day (TID) | ORAL | Status: DC | PRN

## 2016-10-02 MED ORDER — SUCRALFATE 1 G PO TABS
1.0000 g | ORAL_TABLET | Freq: Two times a day (BID) | ORAL | Status: DC
  Administered 2016-10-02 – 2016-10-04 (×4): 1 g via ORAL
  Filled 2016-10-02 (×4): qty 1

## 2016-10-02 MED ORDER — PROPOFOL 500 MG/50ML IV EMUL
INTRAVENOUS | Status: DC | PRN
  Administered 2016-10-02: 50 ug/kg/min via INTRAVENOUS

## 2016-10-02 MED ORDER — DIPHENHYDRAMINE HCL 12.5 MG/5ML PO ELIX: 12.5000 mg | ORAL_SOLUTION | ORAL | Status: DC | PRN

## 2016-10-02 MED ORDER — PROMETHAZINE HCL 25 MG/ML IJ SOLN: 6.2500 mg | INTRAMUSCULAR | Status: DC | PRN

## 2016-10-02 MED ORDER — DULOXETINE HCL 30 MG PO CPEP
30.0000 mg | ORAL_CAPSULE | Freq: Every day | ORAL | Status: DC
  Administered 2016-10-02 – 2016-10-04 (×3): 30 mg via ORAL
  Filled 2016-10-02 (×3): qty 1

## 2016-10-02 MED ORDER — FAMOTIDINE 20 MG PO TABS
40.0000 mg | ORAL_TABLET | Freq: Every day | ORAL | Status: DC
  Administered 2016-10-02 – 2016-10-03 (×2): 40 mg via ORAL
  Filled 2016-10-02 (×2): qty 2

## 2016-10-02 MED ORDER — ONDANSETRON HCL 4 MG/2ML IJ SOLN
INTRAMUSCULAR | Status: AC
Start: 2016-10-02 — End: 2016-10-02
  Filled 2016-10-02: qty 2

## 2016-10-02 MED ORDER — LIDOCAINE 2% (20 MG/ML) 5 ML SYRINGE
INTRAMUSCULAR | Status: DC | PRN
  Administered 2016-10-02: 40 mg via INTRAVENOUS

## 2016-10-02 MED ORDER — HYDROMORPHONE HCL 1 MG/ML IJ SOLN
0.2500 mg | INTRAMUSCULAR | Status: DC | PRN
  Administered 2016-10-02 (×3): 0.5 mg via INTRAVENOUS

## 2016-10-02 MED ORDER — ONDANSETRON HCL 4 MG/2ML IJ SOLN
INTRAMUSCULAR | Status: DC | PRN
  Administered 2016-10-02: 4 mg via INTRAVENOUS

## 2016-10-02 MED ORDER — PROPOFOL 500 MG/50ML IV EMUL: INTRAVENOUS | Status: DC | PRN

## 2016-10-02 MED ORDER — MENTHOL 3 MG MT LOZG: 1.0000 | LOZENGE | OROMUCOSAL | Status: DC | PRN

## 2016-10-02 MED ORDER — POLYETHYLENE GLYCOL 3350 17 G PO PACK: 17.0000 g | PACK | Freq: Every day | ORAL | Status: DC | PRN

## 2016-10-02 MED ORDER — RIVAROXABAN 10 MG PO TABS: 10.0000 mg | ORAL_TABLET | Freq: Every day | ORAL | Status: DC

## 2016-10-02 MED ORDER — OXYCODONE HCL 5 MG PO TABS
5.0000 mg | ORAL_TABLET | ORAL | Status: DC | PRN
  Administered 2016-10-02: 10 mg via ORAL
  Administered 2016-10-02: 5 mg via ORAL
  Administered 2016-10-02: 10 mg via ORAL
  Administered 2016-10-02: 5 mg via ORAL
  Administered 2016-10-03 – 2016-10-04 (×8): 10 mg via ORAL
  Filled 2016-10-02 (×5): qty 2
  Filled 2016-10-02: qty 1
  Filled 2016-10-02 (×2): qty 2
  Filled 2016-10-02: qty 1
  Filled 2016-10-02 (×3): qty 2

## 2016-10-02 MED ORDER — ACETAMINOPHEN 10 MG/ML IV SOLN
INTRAVENOUS | Status: AC
  Filled 2016-10-02: qty 100

## 2016-10-02 MED ORDER — ONDANSETRON HCL 4 MG PO TABS: 4.0000 mg | ORAL_TABLET | Freq: Four times a day (QID) | ORAL | Status: DC | PRN

## 2016-10-02 MED ORDER — VANCOMYCIN HCL IN DEXTROSE 1-5 GM/200ML-% IV SOLN
1000.0000 mg | Freq: Two times a day (BID) | INTRAVENOUS | Status: AC
  Administered 2016-10-02: 1000 mg via INTRAVENOUS
  Filled 2016-10-02: qty 200

## 2016-10-02 MED ORDER — PHENOL 1.4 % MT LIQD: 1.0000 | OROMUCOSAL | Status: DC | PRN

## 2016-10-02 MED ORDER — SODIUM CHLORIDE 0.9 % IR SOLN
Status: DC | PRN
  Administered 2016-10-02: 1000 mL

## 2016-10-02 MED ORDER — ASPIRIN 325 MG PO TABS
325.0000 mg | ORAL_TABLET | Freq: Every day | ORAL | Status: DC
  Administered 2016-10-04: 325 mg via ORAL
  Filled 2016-10-02 (×2): qty 1

## 2016-10-02 MED ORDER — OXYBUTYNIN CHLORIDE ER 5 MG PO TB24
5.0000 mg | ORAL_TABLET | Freq: Every day | ORAL | Status: DC
  Administered 2016-10-03 – 2016-10-04 (×2): 5 mg via ORAL
  Filled 2016-10-02 (×2): qty 1

## 2016-10-02 MED ORDER — METOCLOPRAMIDE HCL 5 MG/ML IJ SOLN: 5.0000 mg | Freq: Three times a day (TID) | INTRAMUSCULAR | Status: DC | PRN

## 2016-10-02 SURGICAL SUPPLY — 29 items
BLADE SAG 18X100X1.27 (BLADE) ×2 IMPLANT
CAPT HIP TOTAL 2 ×1 IMPLANT
CLOTH BEACON ORANGE TIMEOUT ST (SAFETY) ×2 IMPLANT
COVER PERINEAL POST (MISCELLANEOUS) ×2 IMPLANT
COVER SURGICAL LIGHT HANDLE (MISCELLANEOUS) ×2 IMPLANT
DRAPE STERI IOBAN 125X83 (DRAPES) ×2 IMPLANT
DRAPE U-SHAPE 47X51 STRL (DRAPES) ×4 IMPLANT
DRSG ADAPTIC 3X8 NADH LF (GAUZE/BANDAGES/DRESSINGS) ×2 IMPLANT
DRSG MEPILEX BORDER 4X4 (GAUZE/BANDAGES/DRESSINGS) ×2 IMPLANT
DRSG MEPILEX BORDER 4X8 (GAUZE/BANDAGES/DRESSINGS) ×2 IMPLANT
DURAPREP 26ML APPLICATOR (WOUND CARE) ×2 IMPLANT
ELECT REM PT RETURN 15FT ADLT (MISCELLANEOUS) ×2 IMPLANT
EVACUATOR 1/8 PVC DRAIN (DRAIN) ×2 IMPLANT
GLOVE BIO SURGEON STRL SZ7.5 (GLOVE) ×2 IMPLANT
GLOVE BIO SURGEON STRL SZ8 (GLOVE) ×4 IMPLANT
GLOVE BIOGEL PI IND STRL 8 (GLOVE) ×2 IMPLANT
GLOVE BIOGEL PI INDICATOR 8 (GLOVE) ×2
GOWN STRL REUS W/TWL LRG LVL3 (GOWN DISPOSABLE) ×2 IMPLANT
GOWN STRL REUS W/TWL XL LVL3 (GOWN DISPOSABLE) ×2 IMPLANT
PACK ANTERIOR HIP CUSTOM (KITS) ×2 IMPLANT
STRIP CLOSURE SKIN 1/2X4 (GAUZE/BANDAGES/DRESSINGS) ×2 IMPLANT
SUT ETHIBOND NAB CT1 #1 30IN (SUTURE) ×2 IMPLANT
SUT MNCRL AB 4-0 PS2 18 (SUTURE) ×2 IMPLANT
SUT STRATAFIX 0 PDS 27 VIOLET (SUTURE) ×2
SUT VIC AB 2-0 CT1 27 (SUTURE) ×4
SUT VIC AB 2-0 CT1 TAPERPNT 27 (SUTURE) ×2 IMPLANT
SUTURE STRATFX 0 PDS 27 VIOLET (SUTURE) ×1 IMPLANT
TRAY FOLEY W/METER SILVER 16FR (SET/KITS/TRAYS/PACK) ×2 IMPLANT
YANKAUER SUCT BULB TIP 10FT TU (MISCELLANEOUS) ×2 IMPLANT

## 2016-10-02 NOTE — Anesthesia Postprocedure Evaluation (Signed)
Anesthesia Post Note  Patient: Jody OsierJessie D Porter  Procedure(s) Performed: Procedure(s) (LRB): RIGHT TOTAL HIP ARTHROPLASTY ANTERIOR APPROACH (Right)  Patient location during evaluation: PACU Anesthesia Type: Spinal Level of consciousness: oriented and awake and alert Pain management: pain level controlled Vital Signs Assessment: post-procedure vital signs reviewed and stable Respiratory status: spontaneous breathing, respiratory function stable and patient connected to nasal cannula oxygen Cardiovascular status: blood pressure returned to baseline and stable Postop Assessment: no headache and no backache Anesthetic complications: no       Last Vitals:  Vitals:   10/02/16 1311 10/02/16 1327  BP:  (!) 149/77  Pulse: 67 74  Resp: 14 14  Temp:  36.6 C    Last Pain:  Vitals:   10/02/16 1334  TempSrc:   PainSc: 8                  Donice Alperin S

## 2016-10-02 NOTE — Progress Notes (Signed)
Per Jody Porter from Infectious Disease:  If no +MRSA culture in 30 days, NO NEED FOR ISOLATION.  Last + MRSA was 07/18/2016, from finger.

## 2016-10-02 NOTE — Op Note (Signed)
OPERATIVE REPORT- TOTAL HIP ARTHROPLASTY   PREOPERATIVE DIAGNOSIS: Osteoarthritis of the Right hip.   POSTOPERATIVE DIAGNOSIS: Osteoarthritis of the Right  hip.   PROCEDURE: Right total hip arthroplasty, anterior approach.   SURGEON: Ollen GrossFrank Garmon Dehn, MD   ASSISTANT: Avel Peacerew Perkins, PA-C  ANESTHESIA:  Spinal  ESTIMATED BLOOD LOSS:-550 ml    DRAINS: Hemovac x1.   COMPLICATIONS: None   CONDITION: PACU - hemodynamically stable.   BRIEF CLINICAL NOTE: Jody Porter is a 81 y.o. female who has advanced end-  stage arthritis of their Right  hip with progressively worsening pain and  dysfunction.The patient has failed nonoperative management and presents for  total hip arthroplasty.   PROCEDURE IN DETAIL: After successful administration of spinal  anesthetic, the [facility] were [facility] on both  feet and the [facility], boots placed into the leg  holders. The Right hip was then isolated from the perineum with plastic  drapes and prepped and draped in the usual sterile fashion. ASIS and  greater trochanter were marked and a oblique incision was made, starting  at about 1 cm lateral and 2 cm distal to the ASIS and coursing towards  the anterior cortex of the femur. The skin was cut with a 10 blade  through subcutaneous tissue to the level of the fascia overlying the  tensor fascia lata muscle. The fascia was then incised in line with the  incision at the junction of the anterior third and posterior 2/3rd. The  muscle was teased off the fascia and then the interval between the TFL  and the rectus was developed. The Hohmann retractor was then placed at  the top of the femoral neck over the capsule. The vessels overlying the  capsule were cauterized and the fat on top of the capsule was removed.  A Hohmann retractor was then placed anterior underneath the rectus  femoris to give exposure to the entire anterior capsule. A T-shaped   capsulotomy was performed. The edges were tagged and the femoral head  was identified.       Osteophytes are removed off the superior acetabulum.  The femoral neck was then cut in situ with an oscillating saw. Traction  was then applied to the left lower extremity utilizing the [facility]  traction. The femoral head was then removed. Retractors were placed  around the acetabulum and then circumferential removal of the labrum was  performed. Osteophytes were also removed. Reaming starts at 43 mm to  medialize and  Increased in 2 mm increments to 47 mm. We reamed in  approximately 40 degrees of abduction, 20 degrees anteversion. A 48 mm  pinnacle acetabular shell was then impacted in anatomic position under  fluoroscopic guidance with excellent purchase. We did not need to place  any additional dome screws. A 28 mm neutral + 4 marathon liner was then  placed into the acetabular shell.       The femoral lift was then placed along the lateral aspect of the femur  just distal to the vastus ridge. The leg was  externally rotated and capsule  was stripped off the inferior aspect of the femoral neck down to the  level of the lesser trochanter, this was done with electrocautery. The femur was lifted after this was performed. The  leg was then placed in an extended and adducted position essentially delivering the femur. We also removed the capsule superiorly and the piriformis from the piriformis  fossa to gain excellent exposure of the  proximal femur. Rongeur was used to remove some cancellous bone to get  into the lateral portion of the proximal femur for placement of the  initial starter reamer. The starter broaches was placed  the starter broach  and was shown to go down the center of the canal. Broaching  with the  Corail system was then performed starting at size 8, coursing  Up to size 11. A size 11 had excellent torsional and rotational  and axial stability. The trial high offset neck was then  placed  with a 28 + 1.5 trial head. The hip was then reduced. We confirmed that  the stem was in the canal both on AP and lateral x-rays. It also has excellent sizing. The hip was reduced with outstanding stability through full extension and full external rotation.. AP pelvis was taken and the leg lengths were measured and found to be equal. Hip was then dislocated again and the femoral head and neck removed. The  femoral broach was removed. Size 11 Corail stem with a high offset  neck was then impacted into the femur following native anteversion. Has  excellent purchase in the canal. Excellent torsional and rotational and  axial stability. It is confirmed to be in the canal on AP and lateral  fluoroscopic views. The 28 + 1.5 ceramic head was placed and the hip  reduced with outstanding stability. Again AP pelvis was taken and it  confirmed that the leg lengths were equal. The wound was then copiously  irrigated with saline solution and the capsule reattached and repaired  with Ethibond suture. 30 ml of .25% Bupivicaine was  injected into the capsule and into the edge of the tensor fascia lata as well as subcutaneous tissue. The fascia overlying the tensor fascia lata was then closed with a running #1 V-Loc. Subcu was closed with interrupted 2-0 Vicryl and subcuticular running 4-0 Monocryl. Incision was cleaned  and dried. Steri-Strips and a bulky sterile dressing applied. Hemovac  drain was hooked to suction and then the patient was awakened and transported to  recovery in stable condition.        Please note that a surgical assistant was a medical necessity for this procedure to perform it in a safe and expeditious manner. Assistant was necessary to provide appropriate retraction of vital neurovascular structures and to prevent femoral fracture and allow for anatomic placement of the prosthesis.  Gaynelle Arabian, M.D.

## 2016-10-02 NOTE — Anesthesia Preprocedure Evaluation (Signed)
Anesthesia Evaluation  Patient identified by MRN, date of birth, ID band Patient awake    Reviewed: Allergy & Precautions, NPO status , Patient's Chart, lab work & pertinent test results  Airway Mallampati: II  TM Distance: >3 FB Neck ROM: Full    Dental no notable dental hx.    Pulmonary sleep apnea ,    Pulmonary exam normal breath sounds clear to auscultation       Cardiovascular hypertension, Normal cardiovascular exam Rhythm:Regular Rate:Normal     Neuro/Psych negative neurological ROS  negative psych ROS   GI/Hepatic negative GI ROS, Neg liver ROS,   Endo/Other  Hypothyroidism Morbid obesity  Renal/GU negative Renal ROS  negative genitourinary   Musculoskeletal negative musculoskeletal ROS (+)   Abdominal   Peds negative pediatric ROS (+)  Hematology negative hematology ROS (+)   Anesthesia Other Findings   Reproductive/Obstetrics negative OB ROS                             Anesthesia Physical Anesthesia Plan  ASA: III  Anesthesia Plan: Spinal   Post-op Pain Management:    Induction: Intravenous  Airway Management Planned: Simple Face Mask  Additional Equipment:   Intra-op Plan:   Post-operative Plan:   Informed Consent: I have reviewed the patients History and Physical, chart, labs and discussed the procedure including the risks, benefits and alternatives for the proposed anesthesia with the patient or authorized representative who has indicated his/her understanding and acceptance.   Dental advisory given  Plan Discussed with: CRNA and Surgeon  Anesthesia Plan Comments:         Anesthesia Quick Evaluation

## 2016-10-02 NOTE — H&P (View-Only) (Signed)
Jody Porter DOB: 11/07/1935 Widowed / Language: [place] / Race: White Female Date of Admission:  10/02/16 CC:  Right Hip pain History of Present Illness  The patient is a 81 year old female who comes in for a preoperative History and Physical. The patient is scheduled for a right total hip arthroplasty (anterior) to be performed by Dr. Gus RankinFrank V. Aluisio, [facility]. The patient is a 81 year old female who presented for follow up of their hip. The patient is being followed for their right hip pain. They are months out from from IA injection. Symptoms reported include: pain (right hip to knee), aching and pain with weightbearing. The patient feels that they are doing poorly and report their pain level to be moderate to severe. The following medication has been used for pain control: Tylenol. The patient has reported improvement of their symptoms with: Cortisone injections (helped some). Jody Porter was seen months out from her intra-articular injection of the right hip. She was accompanied by her son Tinnie GensJeffrey. She complains of pain in and about the right hip down toward the knee and also pain with weightbearing and it wants to buckle and give way. The soreness has been preventing her from using the leg and she is not as active. Walking is becoming more impossible over the past couple of months and her biggest concern is falling at this point. She has not been on any medications for the past 24 hours. It is not unbearable. She did have some hydrocodone in the past. She has had both knees replaced and the son thinks it is coming from her back and it is very difficult trying to control the right leg. The most recent injection did help, lasted for a little while, but not a 100% improvement. Unfortunately, the injection is wearing off and walking has become more difficult. Balance is a big issue with her. She is very concerned about falling. She does have pain at night though,  which will wake her up. She uses a rollator walker. Also uses a cane and walker around the house. Again, her biggest concern is falling. She would like to get the hip fixed. They have been treated conservatively in the past for the above stated problem and despite conservative measures, they continue to have progressive pain and severe functional limitations and dysfunction. They have failed non-operative management including home exercise, medications, and injections. It is felt that they would benefit from undergoing total joint replacement. Risks and benefits of the procedure have been discussed with the patient and they elect to proceed with surgery. There are no active contraindications to surgery such as ongoing infection or rapidly progressive neurological disease.    Problem List/Past Medical Acute pain of right knee (M25.561)  Degenerative lumbar disc (M51.36)  Pain of right hip joint (M25.551)  Aftercare following right knee joint replacement surgery (Z47.1)  Primary osteoarthritis of right hip (M16.11)  TIA (transient ischemic attack) (G45.9)  Anxiety Disorder  Chronic Pain  Depression  Gastroesophageal Reflux Disease  Hypothyroidism  Sleep Apnea    Allergies  Penicillin [06/04/1999]: Rash. Aspirin [06/04/1999]: Rapid pulse. Valium *ANTIANXIETY AGENTS*  Confusion, Mind-altering  Family History  Depression  child Drug / Alcohol Addiction  Mother. child First Degree Relatives  reported Osteoarthritis  Mother. Rheumatoid Arthritis  Father, Sister. child  Social History Children  4 Current work status  retired Scientist, physiologicalxercise  Exercises never; does other Marital status  widowed Never consumed alcohol  01/31/2016: Never consumed  alcohol No history of drug/alcohol rehab  Not under pain contract  Number of flights of stairs before winded  less than 1 Tobacco / smoke exposure  01/31/2016: no Tobacco use  Never smoker. 01/31/2016  Medication  History Pantoprazole Sodium (40MG  Tablet DR, Oral) Active. Naproxen (Oral) Specific strength unknown - Active. Mineral preparation Active. (a mail order medication that pertains to hemp and marijuana; she states she takes this for anxiety) Tylenol Extra Strength (Oral) Specific strength unknown - Active. Hair, Skin, and Nails Supplement Active. RaNITidine HCl (300MG  Tablet, Oral daily with evening meal) Active. Sucralfate (1GM Tablet, Oral PO BID) Active. Mirtazapine (30MG  Tablet, Oral PO QHS) Active. QUEtiapine Fumarate (25MG  Tablet, Oral PO QHS) Active. Levothroid ( Tablet, Oral PO QAM on empty stomach) Active. DULoxetine HCl (30MG  Capsule DR Part, Oral daily) Active. Memantine HCl (10MG  Tablet, Oral PO BID) Active. Clopidogrel Bisulfate (75MG  Tablet, Oral PO Daily) Active. Oxybutynin Chloride ER (5MG  Tablet ER 24HR, Oral PO Daily) Active. Hydrocodone-Acetaminophen (5-325MG  Tablet, Oral 1-2 every 8 hours as needed for paoin) Active. LORazepam (0.5MG  Tablet, Oral PO Q 8 hours as needed) Active. Fish Oil (Oral) Specific strength unknown - Active. Vitamin D3 (Oral) Specific strength unknown - Active.   Past Surgical History Appendectomy  Carpal Tunnel Repair  bilateral Cataract Surgery  bilateral Cesarean Delivery  3 or more times Gallbladder Surgery  open Thyroidectomy; Subtotal  Total Knee Replacement  bilateral   Review of Systems  General Not Present- Chills, Fatigue, Fever, Memory Loss, Night Sweats, Weight Gain and Weight Loss. Skin Not Present- Eczema, Hives, Itching, Lesions and Rash. HEENT Not Present- Dentures, Double Vision, Headache, Hearing Loss, Tinnitus and Visual Loss. Respiratory Not Present- Allergies, Chronic Cough, Coughing up blood, Shortness of breath at rest and Shortness of breath with exertion. Cardiovascular Not Present- Chest Pain, Difficulty Breathing Lying Down, Murmur, Palpitations, Racing/skipping heartbeats and  Swelling. Gastrointestinal Not Present- Abdominal Pain, Bloody Stool, Constipation, Diarrhea, Difficulty Swallowing, Heartburn, Jaundice, Loss of appetitie, Nausea and Vomiting. Female Genitourinary Not Present- Blood in Urine, Discharge, Flank Pain, Incontinence, Painful Urination, Urgency, Urinary frequency, Urinary Retention, Urinating at Night and Weak urinary stream. Musculoskeletal Present- Joint Pain. Not Present- Back Pain, Joint Swelling, Morning Stiffness, Muscle Pain, Muscle Weakness and Spasms. Neurological Not Present- Blackout spells, Difficulty with balance, Dizziness, Paralysis, Tremor and Weakness. Psychiatric Not Present- Insomnia.  Vitals Weight: 192 lb Height: 57in Weight was reported by patient. Height was reported by patient. Body Surface Area: 1.77 m Body Mass Index: 41.55 kg/m  Pulse: 104 (Regular)  BP: 138/88 (Sitting, Right Arm, Standard)       Physical Exam General Mental Status -Alert, cooperative and good historian. General Appearance-pleasant, Not in acute distress. Orientation-Oriented X3. Build & Nutrition-Well nourished and Well developed.  Head and Neck Head-normocephalic, atraumatic . Neck Global Assessment - supple, no bruit auscultated on the right, no bruit auscultated on the left.  Eye Vision-Wears corrective lenses. Pupil - Bilateral-Regular and Round. Motion - Bilateral-EOMI.  Chest and Lung Exam Auscultation Breath sounds - clear at anterior chest wall and clear at posterior chest wall. Adventitious sounds - No Adventitious sounds.  Cardiovascular Auscultation Rhythm - Regular rate and rhythm. Heart Sounds - S1 WNL and S2 WNL. Murmurs & Other Heart Sounds - Auscultation of the heart reveals - No Murmurs.  Abdomen Palpation/Percussion Tenderness - Abdomen is non-tender to palpation. Rigidity (guarding) - Abdomen is soft. Auscultation Auscultation of the abdomen reveals - Bowel sounds normal.  Female  Genitourinary Note: Not done, not pertinent to present  illness   Musculoskeletal Note: Right hip is examined. She has got some guarded flexion of about 110 degrees, internal rotation about 20, external rotation about 25, abduction is limited. Examined her in the chair, but she does have pain noted on internal and external rotation. There is no radicular component, no numbness or tingling.  X-RAYS No new x-rays are taken today. We had x-rays recently a couple of months ago, which show significant arthritic changes with essentially bone on bone in the medial femoral and acetabular component with some femoral head spurring. Lateral view confirms this also with the spurring on the femoral head.   Assessment & Plan Primary osteoarthritis of right hip (M16.11)  Note:Surgical Plans: Right Total Hip Replacement - Anterior Approach  Disposition: Home with assistance, HHPT  PCP: Dr. Anson Crofts  Topical TXA - History of TIA  Anesthesia Issues: None  Patient was instructed on what medications to stop prior to surgery.  Signed electronically by Lauraine Rinne, III PA-C

## 2016-10-02 NOTE — Discharge Instructions (Signed)
Dr. [name] Total Joint Specialist [facility] [address] [place], [place] [address] [phone]  ANTERIOR APPROACH TOTAL HIP REPLACEMENT POSTOPERATIVE DIRECTIONS   Hip Rehabilitation, Guidelines Following Surgery  The results of a hip operation are greatly improved after range of motion and muscle strengthening exercises. Follow all safety measures which are given to protect your hip. If any of these exercises cause increased pain or swelling in your joint, decrease the amount until you are comfortable again. Then slowly increase the exercises. Call your caregiver if you have problems or questions.   HOME CARE INSTRUCTIONS  Remove items at home which could result in a fall. This includes throw rugs or furniture in walking pathways.   ICE to the affected hip every three hours for 30 minutes at a time and then as needed for pain and swelling.  Continue to use ice on the hip for pain and swelling from surgery. You may notice swelling that will progress down to the foot and ankle.  This is normal after surgery.  Elevate the leg when you are not up walking on it.    Continue to use the breathing machine which will help keep your temperature down.  It is common for your temperature to cycle up and down following surgery, especially at night when you are not up moving around and exerting yourself.  The breathing machine keeps your lungs expanded and your temperature down.   DIET You may resume your previous home diet once your are discharged from the hospital.  DRESSING / WOUND CARE / SHOWERING You may change your dressing 3-5 days after surgery.  Then change the dressing every day with sterile gauze.  Please use good hand washing techniques before changing the dressing.  Do not use any lotions or creams on the incision until instructed by your surgeon. You may start showering once you are discharged home but do not submerge the incision under water. Just pat  the incision dry and apply a dry gauze dressing on daily. Change the surgical dressing daily and reapply a dry dressing each time.  ACTIVITY Walk with your walker as instructed. Use walker as long as suggested by your caregivers. Avoid periods of inactivity such as sitting longer than an hour when not asleep. This helps prevent blood clots.  You may resume a sexual relationship in one month or when given the OK by your doctor.  You may return to work once you are cleared by your doctor.  Do not drive a car for 6 weeks or until released by you surgeon.  Do not drive while taking narcotics.  WEIGHT BEARING Weight bearing as tolerated with assist device (walker, cane, etc) as directed, use it as long as suggested by your surgeon or therapist, typically at least 4-6 weeks.  POSTOPERATIVE CONSTIPATION PROTOCOL Constipation - defined medically as fewer than three stools per week and severe constipation as less than one stool per week.  One of the most common issues patients have following surgery is constipation.  Even if you have a regular bowel pattern at home, your normal regimen is likely to be disrupted due to multiple reasons following surgery.  Combination of anesthesia, postoperative narcotics, change in appetite and fluid intake all can affect your bowels.  In order to avoid complications following surgery, here are some recommendations in order to help you during your recovery period.  Colace (docusate) - Pick up an over-the-counter form of Colace or another stool softener and take twice a  day as long as you are requiring postoperative pain medications.  Take with a full glass of water daily.  If you experience loose stools or diarrhea, hold the colace until you stool forms back up.  If your symptoms do not get better within 1 week or if they get worse, check with your doctor.  Dulcolax (bisacodyl) - Pick up over-the-counter and take as directed by the product packaging as needed to assist  with the movement of your bowels.  Take with a full glass of water.  Use this product as needed if not relieved by Colace only.   MiraLax (polyethylene glycol) - Pick up over-the-counter to have on hand.  MiraLax is a solution that will increase the amount of water in your bowels to assist with bowel movements.  Take as directed and can mix with a glass of water, juice, soda, coffee, or tea.  Take if you go more than two days without a movement. Do not use MiraLax more than once per day. Call your doctor if you are still constipated or irregular after using this medication for 7 days in a row.  If you continue to have problems with postoperative constipation, please contact the office for further assistance and recommendations.  If you experience "the worst abdominal pain ever" or develop nausea or vomiting, please contact the office immediatly for further recommendations for treatment.  ITCHING  If you experience itching with your medications, try taking only a single pain pill, or even half a pain pill at a time.  You can also use Benadryl over the counter for itching or also to help with sleep.   TED HOSE STOCKINGS Wear the elastic stockings on both legs for three weeks following surgery during the day but you may remove then at night for sleeping.  MEDICATIONS See your medication summary on the After Visit Summary that the nursing staff will review with you prior to discharge.  You may have some home medications which will be placed on hold until you complete the course of blood thinner medication.  It is important for you to complete the blood thinner medication as prescribed by your surgeon.  Continue your approved medications as instructed at time of discharge.  PRECAUTIONS If you experience chest pain or shortness of breath - call 911 immediately for transfer to the hospital emergency department.  If you develop a fever greater that 101 F, purulent drainage from wound, increased redness  or drainage from wound, foul odor from the wound/dressing, or calf pain - CONTACT YOUR SURGEON.                                                   FOLLOW-UP APPOINTMENTS Make sure you keep all of your appointments after your operation with your surgeon and caregivers. You should call the office at the above phone number and make an appointment for approximately two weeks after the date of your surgery or on the date instructed by your surgeon outlined in the "After Visit Summary".  RANGE OF MOTION AND STRENGTHENING EXERCISES  These exercises are designed to help you keep full movement of your hip joint. Follow your caregiver's or physical therapist's instructions. Perform all exercises about fifteen times, three times per day or as directed. Exercise both hips, even if you have had only one joint replacement. These exercises can be done  on a training (exercise) mat, on the floor, on a table or on a bed. Use whatever works the best and is most comfortable for you. Use music or television while you are exercising so that the exercises are a pleasant break in your day. This will make your life better with the exercises acting as a break in routine you can look forward to.  Lying on your back, slowly slide your foot toward your buttocks, raising your knee up off the floor. Then slowly slide your foot back down until your leg is straight again.  Lying on your back spread your legs as far apart as you can without causing discomfort.  Lying on your side, raise your upper leg and foot straight up from the floor as far as is comfortable. Slowly lower the leg and repeat.  Lying on your back, tighten up the muscle in the front of your thigh (quadriceps muscles). You can do this by keeping your leg straight and trying to raise your heel off the floor. This helps strengthen the largest muscle supporting your knee.  Lying on your back, tighten up the muscles of your buttocks both with the legs straight and with the knee  bent at a comfortable angle while keeping your heel on the floor.   IF YOU ARE TRANSFERRED TO A SKILLED REHAB FACILITY If the patient is transferred to a skilled rehab facility following release from the hospital, a list of the current medications will be sent to the facility for the patient to continue.  When discharged from the skilled rehab facility, please have the facility set up the patient's Home Health Physical Therapy prior to being released. Also, the skilled facility will be responsible for providing the patient with their medications at time of release from the facility to include their pain medication, the muscle relaxants, and their blood thinner medication. If the patient is still at the rehab facility at time of the two week follow up appointment, the skilled rehab facility will also need to assist the patient in arranging follow up appointment in our office and any transportation needs.  MAKE SURE YOU:  Understand these instructions.  Get help right away if you are not doing well or get worse.    Pick up stool softner and laxative for home use following surgery while on pain medications. Do not submerge incision under water. Please use good hand washing techniques while changing dressing each day. May shower starting three days after surgery. Please use a clean towel to pat the incision dry following showers. Continue to use ice for pain and swelling after surgery. Do not use any lotions or creams on the incision until instructed by your surgeon.

## 2016-10-02 NOTE — Anesthesia Procedure Notes (Addendum)
Procedure Name: MAC Date/Time: 10/02/2016 10:28 AM Performed by: Dione Booze Pre-anesthesia Checklist: Patient identified, Emergency Drugs available, Patient being monitored and Suction available Patient Re-evaluated:Patient Re-evaluated prior to inductionOxygen Delivery Method: Nasal cannula Preoxygenation: Pre-oxygenation with 100% oxygen Placement Confirmation: positive ETCO2 Comments: Pt claustrophobic with 02 mask.

## 2016-10-02 NOTE — Interval H&P Note (Signed)
History and Physical Interval Note:  10/02/2016 8:32 AM  Jody Porter  has presented today for surgery, with the diagnosis of Osteoarthritis right hip  The various methods of treatment have been discussed with the patient and family. After consideration of risks, benefits and other options for treatment, the patient has consented to  Procedure(s): RIGHT TOTAL HIP ARTHROPLASTY ANTERIOR APPROACH (Right) as a surgical intervention .  The patient's history has been reviewed, patient examined, no change in status, stable for surgery.  I have reviewed the patient's chart and labs.  Questions were answered to the patient's satisfaction.     Loanne DrillingALUISIO,Edie Vallandingham V

## 2016-10-02 NOTE — Transfer of Care (Signed)
Immediate Anesthesia Transfer of Care Note  Patient: Jody Porter  Procedure(s) Performed: Procedure(s): RIGHT TOTAL HIP ARTHROPLASTY ANTERIOR APPROACH (Right)  Patient Location: PACU  Anesthesia Type:MAC and Spinal  Level of Consciousness: awake, alert , oriented and patient cooperative  Airway & Oxygen Therapy: Patient Spontanous Breathing and Patient connected to nasal cannula oxygen  Post-op Assessment: Report given to RN and Post -op Vital signs reviewed and stable  Post vital signs: Reviewed and stable  Last Vitals:  Vitals:   10/02/16 0826  BP: (!) 178/89  Pulse: 81  Resp: (!) 22  Temp: 36.4 C    Last Pain:  Vitals:   10/02/16 1012  TempSrc:   PainSc: 4       Patients Stated Pain Goal: 4 (05/05/48 7530)  Complications: No apparent anesthesia complications

## 2016-10-02 NOTE — Anesthesia Procedure Notes (Signed)
Spinal  Patient location during procedure: OR Start time: 10/02/2016 10:10 AM End time: 10/02/2016 10:20 AM Staffing Anesthesiologist: Denim Start, Iona Beard Performed: anesthesiologist  Preanesthetic Checklist Completed: patient identified, site marked, surgical consent, pre-op evaluation, timeout performed, IV checked, risks and benefits discussed and monitors and equipment checked Spinal Block Patient position: sitting Prep: Betadine Patient monitoring: heart rate, continuous pulse ox and blood pressure Injection technique: single-shot Needle Needle type: Sprotte  Needle gauge: 24 G Needle length: 9 cm Additional Notes Expiration date of kit checked and confirmed. Patient tolerated procedure well, without complications.

## 2016-10-03 ENCOUNTER — Encounter (HOSPITAL_COMMUNITY): Payer: Self-pay | Admitting: Orthopedic Surgery

## 2016-10-03 LAB — CBC
HCT: 29.2 % — ABNORMAL LOW (ref 36.0–46.0)
Hemoglobin: 9.5 g/dL — ABNORMAL LOW (ref 12.0–15.0)
MCH: 28.2 pg (ref 26.0–34.0)
MCHC: 32.5 g/dL (ref 30.0–36.0)
MCV: 86.6 fL (ref 78.0–100.0)
PLATELETS: 165 10*3/uL (ref 150–400)
RBC: 3.37 MIL/uL — AB (ref 3.87–5.11)
RDW: 16.2 % — ABNORMAL HIGH (ref 11.5–15.5)
WBC: 6.7 10*3/uL (ref 4.0–10.5)

## 2016-10-03 LAB — BASIC METABOLIC PANEL
Anion gap: 6 (ref 5–15)
BUN: 14 mg/dL (ref 4–21)
BUN: 14 mg/dL (ref 6–20)
CHLORIDE: 105 mmol/L (ref 101–111)
CO2: 26 mmol/L (ref 22–32)
CREATININE: 0.7 mg/dL (ref 0.5–1.1)
CREATININE: 0.68 mg/dL (ref 0.44–1.00)
Calcium: 8.2 mg/dL — ABNORMAL LOW (ref 8.9–10.3)
GFR calc Af Amer: >60 mL/min (ref >60)
GLUCOSE: 142 mg/dL — AB (ref 65–99)
Glucose: 142 mg/dL
POTASSIUM: 4.2 mmol/L (ref 3.4–5.3)
Potassium: 4.2 mmol/L (ref 3.5–5.1)
SODIUM: 137 mmol/L (ref 137–147)
SODIUM: 137 mmol/L (ref 135–145)

## 2016-10-03 LAB — CBC AND DIFFERENTIAL
HCT: 29 % — AB (ref 36–46)
HEMOGLOBIN: 9.5 g/dL — AB (ref 12.0–16.0)
Platelets: 165 10*3/uL (ref 150–399)
WBC: 6.7 10*3/mL

## 2016-10-03 NOTE — Progress Notes (Addendum)
Physical Therapy Treatment Patient Details Name: Jody Porter MRN: [id number] DOB: 02/26/1936 Today's Date: 10/03/2016    History of Present Illness Patient s/p R THR and with  MH:  anxiety, depression HTN, memory deficits, obesity, arthritis, obesity, Rt TKA, low back pain.    PT Comments    Slow progress.  Pt requiring increased time for all tasks and ltd by pain, fatigue and confusion this pm.     Follow Up Recommendations  SNF;DC plan and follow up therapy as arranged by surgeon     Equipment Recommendations  None recommended by PT    Recommendations for Other Services OT consult     Precautions / Restrictions Precautions Precautions: Fall Restrictions Weight Bearing Restrictions: No Other Position/Activity Restrictions: WBAT    Mobility  Bed Mobility Overal bed mobility: Needs Assistance Bed Mobility: Sit to Supine     Supine to sit: Mod assist;+2 for physical assistance;+2 for safety/equipment Sit to supine: +2 for physical assistance;Mod assist;Max assist   General bed mobility comments: Increased time with cues for sequence and use of L LE and UEs to self assist.  Physical assist to manage bil LEs and to control trunk  Transfers Overall transfer level: Needs assistance Equipment used: Rolling walker (2 wheeled) Transfers: Sit to/from Stand Sit to Stand: Min assist;Mod assist;+2 physical assistance;From elevated surface Stand pivot transfers: Min assist;Mod assist;+2 physical assistance;+2 safety/equipment       General transfer comment: cues for LE management and use of UEs to self assist.  BEd used to assist to standing  Ambulation/Gait Ambulation/Gait assistance: Min assist;Mod assist;+2 physical assistance;+2 safety/equipment Ambulation Distance (Feet): 7 Feet Assistive device: Rolling walker (2 wheeled) Gait Pattern/deviations: Step-to pattern;Decreased step length - right;Decreased step length - left;Decreased stance time - right;Shuffle;Trunk  flexed Gait velocity: decr Gait velocity interpretation: Below normal speed for age/gender General Gait Details: cues for sequence, posture, position from RW and increased UE WB   Stairs            Wheelchair Mobility    Modified Rankin (Stroke Patients Only)       Balance Overall balance assessment: Needs assistance Sitting-balance support: No upper extremity supported;Feet supported Sitting balance-Leahy Scale: Fair     Standing balance support: Bilateral upper extremity supported Standing balance-Leahy Scale: Poor                              Cognition Arousal/Alertness: Awake/alert Behavior During Therapy: WFL for tasks assessed/performed Overall Cognitive Status: Within Functional Limits for tasks assessed                                        Exercises Total Joint Exercises Ankle Circles/Pumps: AROM;Both;15 reps;Supine Heel Slides: AAROM;Right;5 reps;Supine    General Comments        Pertinent Vitals/Pain Pain Assessment: 0-10 Pain Score: 7  Pain Location: R hip/thigh/knee Pain Descriptors / Indicators: Aching;Burning;Sore;Spasm Pain Intervention(s): Limited activity within patient's tolerance;Monitored during session;Premedicated before session;Ice applied    Home Living Family/patient expects to be discharged to:: Unsure Living Arrangements: Children Available Help at Discharge: Family;Available PRN/intermittently Type of Home: House Home Access: Stairs to enter Entrance Stairs-Rails: None Home Layout: Multi-level Home Equipment: Environmental consultant - 4 wheels;Walker - 2 wheels;Shower seat;Wheelchair - manual;Cane - single point Additional Comments: Pt lives with son who works long hours.  Pt has stairs to multiple levels  in home including bathroom    Prior Function Level of Independence: Independent with assistive device(s);Needs assistance  Gait / Transfers Assistance Needed: Uses RW or cane for ambulation since September  2017 ADL's / Homemaking Assistance Needed: Son prepares most meal and does housekeeping.  Patient able to complete own bathing/dressing except for socks (her son assists with this).     PT Goals (current goals can now be found in the care plan section) Acute Rehab PT Goals Patient Stated Goal: Less pain PT Goal Formulation: With patient Time For Goal Achievement: 10/08/16 Potential to Achieve Goals: Fair Progress towards PT goals: Progressing toward goals    Frequency    7X/week      PT Plan Current plan remains appropriate    Co-evaluation              AM-PAC PT "6 Clicks" Daily Activity  Outcome Measure  Difficulty turning over in bed (including adjusting bedclothes, sheets and blankets)?: A Lot Difficulty moving from lying on back to sitting on the side of the bed? : A Lot Difficulty sitting down on and standing up from a chair with arms (e.g., wheelchair, bedside commode, etc,.)?: A Lot Help needed moving to and from a bed to chair (including a wheelchair)?: A Lot Help needed walking in hospital room?: A Lot Help needed climbing 3-5 steps with a railing? : Total 6 Click Score: 11    End of Session Equipment Utilized During Treatment: Gait belt Activity Tolerance: Patient limited by pain;Patient limited by fatigue Patient left: in bed;with call bell/phone within reach;with bed alarm set;with family/visitor present Nurse Communication: Mobility status PT Visit Diagnosis: History of falling (Z91.81);Difficulty in walking, not elsewhere classified (R26.2)     Time: [id number] PT Time Calculation (min) (ACUTE ONLY): 26 min  Charges:  $Gait Training: 8-22 mins $Therapeutic Activity: 8-22 mins                    G Codes:       Pg [phone]    Dorinda Stehr 10/03/2016, 4:51 PM

## 2016-10-03 NOTE — Progress Notes (Signed)
   Subjective: 1 Day Post-Op Procedure(s) (LRB): RIGHT TOTAL HIP ARTHROPLASTY ANTERIOR APPROACH (Right) Patient reports pain as moderate.   We will start therapy today.  Plan is to go Home after hospital stay.  Objective: Vital signs in last 24 hours: Temp:  [97.5 F (36.4 C)-98.8 F (37.1 C)] 98.8 F (37.1 C) (05/24 0446) Pulse Rate:  [64-93] 77 (05/24 0446) Resp:  [14-23] 17 (05/24 0446) BP: (128-180)/(43-146) 151/70 (05/24 0446) SpO2:  [94 %-100 %] 100 % (05/24 0446) Weight:  [89.4 kg (197 lb)] 89.4 kg (197 lb) (05/23 0854)  Intake/Output from previous day:  Intake/Output Summary (Last 24 hours) at 10/03/16 0725 Last data filed at 10/03/16 0550  Gross per 24 hour  Intake           4192.5 ml  Output             2320 ml  Net           1872.5 ml    Intake/Output this shift: No intake/output data recorded.  Labs:  Recent Labs  10/03/16 0422  HGB 9.5*    Recent Labs  10/03/16 0422  WBC 6.7  RBC 3.37*  HCT 29.2*  PLT 165    Recent Labs  10/03/16 0422  NA 137  K 4.2  CL 105  CO2 26  BUN 14  CREATININE 0.68  GLUCOSE 142*  CALCIUM 8.2*   No results for input(s): LABPT, INR in the last 72 hours.  EXAM General - Patient is Alert, Appropriate and Oriented Extremity - Neurologically intact Neurovascular intact No cellulitis present Compartment soft Dressing - dressing C/D/I Motor Function - intact, moving foot and toes well on exam.  Hemovac pulled without difficulty.  Past Medical History:  Diagnosis Date  . Allergic rhinitis   . Anxiety    hospitalized 03/2016 per daughter   . Degenerative arthritis   . Depression   . Dyspnea    with exertion   . Finger infection    07/2016 for mrsa per daughter but has cleared   . GERD (gastroesophageal reflux disease)   . Hyperparathyroidism   . Hypertension   . Hypothyroid   . Insomnia    w/ sleep apnea NPSG 07/13/2009 AHI 3.5, RDI 29.9/hr  . LBP (low back pain)   . Macular edema    OD  . Memory  difficulties 01/27/2014  . Obesity   . Pre-diabetes   . Prediabetes   . Sleep apnea   . Urinary incontinence     Assessment/Plan: 1 Day Post-Op Procedure(s) (LRB): RIGHT TOTAL HIP ARTHROPLASTY ANTERIOR APPROACH (Right) Principal Problem:   OA (osteoarthritis) of hip   Advance diet Up with therapy D/C IV fluids Plan for discharge tomorrow  DVT Prophylaxis - Xarelto Weight Bearing As Tolerated right Leg Hemovac Pulled Begin Therapy  Loanne DrillingALUISIO,Nabil Bubolz V

## 2016-10-03 NOTE — Progress Notes (Signed)
Discharge planning, spoke with patient and daughters at beside. [facility] Has [facility]W, needs  3-n-1, contacted [facility] to deliver to room. [phone]

## 2016-10-03 NOTE — Evaluation (Signed)
Physical Therapy Evaluation Patient Details Name: Jody Porter MRN: [id number] DOB: February 21, 1936 Today's Date: 10/03/2016   History of Present Illness  Patient s/p L THR and with  MH:  anxiety, depression HTN, memory deficits, obesity, arthritis, obesity, Rt TKA, low back pain.  Clinical Impression  Pt s/p L THR and presents with functional mobility limitations 2* decreased R LE strength/ROM, post op pain, premorbid deconditioning, and obesity.  Pt would greatly benefit from follow up rehab at SNF level to maximize IND and safety prior to dc home with ltd assist.    Follow Up Recommendations SNF;DC plan and follow up therapy as arranged by surgeon    Equipment Recommendations  None recommended by PT    Recommendations for Other Services OT consult     Precautions / Restrictions Precautions Precautions: Fall Restrictions Weight Bearing Restrictions: No Other Position/Activity Restrictions: WBAT      Mobility  Bed Mobility Overal bed mobility: Needs Assistance Bed Mobility: Supine to Sit     Supine to sit: Mod assist;+2 for physical assistance;+2 for safety/equipment     General bed mobility comments: Increased time with cues for sequence and use of L LE and UEs to self assist.  Physical assist to manage R LE and to bring trunk to upright  Transfers Overall transfer level: Needs assistance Equipment used: Rolling walker (2 wheeled) Transfers: Sit to/from Stand Sit to Stand: Min assist;Mod assist;+2 physical assistance;From elevated surface         General transfer comment: cues for LE management and use of UEs to self assist.  BEd used to assist to standing  Ambulation/Gait Ambulation/Gait assistance: Min assist;Mod assist;+2 physical assistance;+2 safety/equipment Ambulation Distance (Feet): 3 Feet Assistive device: Rolling walker (2 wheeled) Gait Pattern/deviations: Step-to pattern;Decreased step length - right;Decreased step length - left;Decreased stance  time - right;Shuffle;Trunk flexed Gait velocity: decr Gait velocity interpretation: Below normal speed for age/gender General Gait Details: cues for sequence, posture, position from RW and increased UE WB  Stairs            Wheelchair Mobility    Modified Rankin (Stroke Patients Only)       Balance Overall balance assessment: Needs assistance Sitting-balance support: No upper extremity supported;Feet supported Sitting balance-Leahy Scale: Fair     Standing balance support: Bilateral upper extremity supported Standing balance-Leahy Scale: Poor                               Pertinent Vitals/Pain Pain Assessment: 0-10 Pain Score: 8  Pain Location: R hip/thigh/knee Pain Descriptors / Indicators: Aching;Burning;Sore;Spasm Pain Intervention(s): Limited activity within patient's tolerance;Monitored during session;Premedicated before session;Ice applied    Home Living Family/patient expects to be discharged to:: Unsure Living Arrangements: Children Available Help at Discharge: Family;Available PRN/intermittently Type of Home: House Home Access: Stairs to enter Entrance Stairs-Rails: None Entrance Stairs-Number of Steps: 3 Home Layout: Multi-level Home Equipment: Walker - 4 wheels;Walker - 2 wheels;Shower seat;Wheelchair - manual;Cane - single point Additional Comments: Pt lives with son who works long hours.  Pt has stairs to multiple levels in home including bathroom    Prior Function Level of Independence: Independent with assistive device(s);Needs assistance   Gait / Transfers Assistance Needed: Uses RW or cane for ambulation since September 2017  ADL's / Homemaking Assistance Needed: Son prepares most meal and does housekeeping.  Patient able to complete own bathing/dressing except for socks (her son assists with this).  Hand Dominance   Dominant Hand: Right    Extremity/Trunk Assessment   Upper Extremity Assessment Upper Extremity  Assessment: Generalized weakness    Lower Extremity Assessment Lower Extremity Assessment: Generalized weakness;RLE deficits/detail RLE: Unable to fully assess due to pain    Cervical / Trunk Assessment Cervical / Trunk Assessment: Kyphotic  Communication   Communication: No difficulties  Cognition Arousal/Alertness: Awake/alert Behavior During Therapy: WFL for tasks assessed/performed Overall Cognitive Status: Within Functional Limits for tasks assessed                                        General Comments      Exercises Total Joint Exercises Ankle Circles/Pumps: AROM;Both;15 reps;Supine Heel Slides: AAROM;Right;5 reps;Supine   Assessment/Plan    PT Assessment Patient needs continued PT services  PT Problem List Decreased strength;Decreased range of motion;Decreased activity tolerance;Decreased mobility;Decreased knowledge of use of DME;Pain;Obesity;Decreased balance       PT Treatment Interventions DME instruction;Gait training;Stair training;Functional mobility training;Therapeutic activities;Therapeutic exercise;Patient/family education    PT Goals (Current goals can be found in the Care Plan section)  Acute Rehab PT Goals Patient Stated Goal: Less pain PT Goal Formulation: With patient Time For Goal Achievement: 10/08/16 Potential to Achieve Goals: Fair    Frequency 7X/week   Barriers to discharge Decreased caregiver support Pt has assist mornings only.    Co-evaluation               AM-PAC PT "6 Clicks" Daily Activity  Outcome Measure Difficulty turning over in bed (including adjusting bedclothes, sheets and blankets)?: A Lot Difficulty moving from lying on back to sitting on the side of the bed? : A Lot Difficulty sitting down on and standing up from a chair with arms (e.g., wheelchair, bedside commode, etc,.)?: A Lot Help needed moving to and from a bed to chair (including a wheelchair)?: A Lot Help needed walking in hospital  room?: A Lot Help needed climbing 3-5 steps with a railing? : Total 6 Click Score: 11    End of Session Equipment Utilized During Treatment: Gait belt Activity Tolerance: Patient limited by pain;Patient limited by fatigue Patient left: in chair;with call bell/phone within reach;with family/visitor present Nurse Communication: Mobility status PT Visit Diagnosis: History of falling (Z91.81);Difficulty in walking, not elsewhere classified (R26.2)    Time: 6213-08651159-1243 PT Time Calculation (min) (ACUTE ONLY): 44 min   Charges:   PT Evaluation $PT Eval Low Complexity: 1 Procedure PT Treatments $Therapeutic Activity: 23-37 mins   PT G Codes:        Pg [phone]    Laelia Angelo 10/03/2016, 1:35 PM

## 2016-10-04 LAB — CBC
HCT: 26.7 % — ABNORMAL LOW (ref 36.0–46.0)
Hemoglobin: 8.6 g/dL — ABNORMAL LOW (ref 12.0–15.0)
MCH: 27.6 pg (ref 26.0–34.0)
MCHC: 32.2 g/dL (ref 30.0–36.0)
MCV: 85.6 fL (ref 78.0–100.0)
PLATELETS: 149 10*3/uL — AB (ref 150–400)
RBC: 3.12 MIL/uL — ABNORMAL LOW (ref 3.87–5.11)
RDW: 16 % — ABNORMAL HIGH (ref 11.5–15.5)
WBC: 7.7 10*3/uL (ref 4.0–10.5)

## 2016-10-04 LAB — BASIC METABOLIC PANEL
Anion gap: 7 (ref 5–15)
BUN: 10 mg/dL (ref 4–21)
BUN: 10 mg/dL (ref 6–20)
CHLORIDE: 103 mmol/L (ref 101–111)
CO2: 26 mmol/L (ref 22–32)
CREATININE: 0.7 mg/dL (ref 0.5–1.1)
CREATININE: 0.69 mg/dL (ref 0.44–1.00)
Calcium: 8.3 mg/dL — ABNORMAL LOW (ref 8.9–10.3)
GFR calc Af Amer: >60 mL/min (ref >60)
GFR calc non Af Amer: >60 mL/min (ref >60)
GLUCOSE: 154 mg/dL
GLUCOSE: 154 mg/dL — AB (ref 65–99)
Potassium: 3.9 mmol/L (ref 3.5–5.1)
SODIUM: 136 mmol/L — AB (ref 137–147)
Sodium: 136 mmol/L (ref 135–145)

## 2016-10-04 LAB — CBC AND DIFFERENTIAL: WBC: 7.7 10*3/mL

## 2016-10-04 MED ORDER — ASPIRIN 325 MG PO TABS: 325.0000 mg | ORAL_TABLET | Freq: Every day | ORAL | 0 refills | Status: DC

## 2016-10-04 MED ORDER — METHOCARBAMOL 500 MG PO TABS: 500.0000 mg | ORAL_TABLET | Freq: Four times a day (QID) | ORAL | 1 refills | Status: AC | PRN

## 2016-10-04 MED ORDER — OXYCODONE HCL 5 MG PO TABS: 5.0000 mg | ORAL_TABLET | ORAL | 0 refills | Status: DC | PRN

## 2016-10-04 NOTE — Progress Notes (Signed)
Physical Therapy Treatment Patient Details Name: Jody Porter MRN: [id number] DOB: 11-03-1935 Today's Date: 10/04/2016    History of Present Illness Patient s/p L THR and with  MH:  anxiety, depression HTN, memory deficits, obesity, arthritis, obesity, Rt TKA, low back pain.    PT Comments    Pt cooperative but progressing slowly with mobility 2* fatigue/pain   Follow Up Recommendations  SNF;DC plan and follow up therapy as arranged by surgeon     Equipment Recommendations  None recommended by PT    Recommendations for Other Services OT consult     Precautions / Restrictions Precautions Precautions: Fall Restrictions Weight Bearing Restrictions: No Other Position/Activity Restrictions: WBAT    Mobility  Bed Mobility Overal bed mobility: Needs Assistance Bed Mobility: Sit to Supine       Sit to supine: +2 for physical assistance;Mod assist   General bed mobility comments: cues for sequence with physical assist to manage bil LEs and control trunk  Transfers Overall transfer level: Needs assistance Equipment used: Rolling walker (2 wheeled) Transfers: Sit to/from Stand Sit to Stand: Min assist;+2 physical assistance;+2 safety/equipment         General transfer comment: cues for UE/LE placement  Ambulation/Gait Ambulation/Gait assistance: Min assist;+2 physical assistance;+2 safety/equipment Ambulation Distance (Feet): 22 Feet Assistive device: Rolling walker (2 wheeled) Gait Pattern/deviations: Step-to pattern;Decreased step length - right;Decreased step length - left;Decreased stance time - right;Shuffle;Trunk flexed Gait velocity: decr Gait velocity interpretation: Below normal speed for age/gender General Gait Details: cues for sequence, posture, position from RW and increased UE WB   Stairs            Wheelchair Mobility    Modified Rankin (Stroke Patients Only)       Balance Overall balance assessment: Needs  assistance Sitting-balance support: No upper extremity supported;Feet supported Sitting balance-Leahy Scale: Fair     Standing balance support: Bilateral upper extremity supported Standing balance-Leahy Scale: Poor                              Cognition Arousal/Alertness: Awake/alert Behavior During Therapy: WFL for tasks assessed/performed Overall Cognitive Status: Within Functional Limits for tasks assessed                                        Exercises      General Comments        Pertinent Vitals/Pain Pain Assessment: 0-10 Pain Score: 10-Worst pain ever Pain Location: R hip/thigh/knee Pain Descriptors / Indicators: Burning Pain Intervention(s): Limited activity within patient's tolerance;Monitored during session;Repositioned    Home Living                      Prior Function            PT Goals (current goals can now be found in the care plan section) Acute Rehab PT Goals Patient Stated Goal: Less pain PT Goal Formulation: With patient Time For Goal Achievement: 10/08/16 Potential to Achieve Goals: Fair Progress towards PT goals: Progressing toward goals    Frequency    7X/week      PT Plan Current plan remains appropriate    Co-evaluation PT/OT/SLP Co-Evaluation/Treatment: Yes Reason for Co-Treatment: For patient/therapist safety PT goals addressed during session: Mobility/safety with mobility OT goals addressed during session: ADL's and self-care      AM-PAC PT "  6 Clicks" Daily Activity  Outcome Measure  Difficulty turning over in bed (including adjusting bedclothes, sheets and blankets)?: A Lot Difficulty moving from lying on back to sitting on the side of the bed? : A Lot Difficulty sitting down on and standing up from a chair with arms (e.g., wheelchair, bedside commode, etc,.)?: A Lot Help needed moving to and from a bed to chair (including a wheelchair)?: A Lot Help needed walking in hospital room?:  A Lot Help needed climbing 3-5 steps with a railing? : Total 6 Click Score: 11    End of Session Equipment Utilized During Treatment: Gait belt Activity Tolerance: Patient tolerated treatment well;Patient limited by fatigue;Patient limited by pain Patient left: in bed;with call bell/phone within reach;with family/visitor present Nurse Communication: Mobility status PT Visit Diagnosis: History of falling (Z91.81);Difficulty in walking, not elsewhere classified (R26.2)     Time: [id number] PT Time Calculation (min) (ACUTE ONLY): 19 min  Charges:  $Gait Training: 8-22 mins                    G Codes:       Pg [phone]     Tobi Leinweber 10/04/2016, 3:42 PM

## 2016-10-04 NOTE — NC FL2 (Signed)
MEDICAID FL2 LEVEL OF CARE SCREENING TOOL     IDENTIFICATION  Patient Name: Jody Porter Birthdate: 07/28/1935 Sex: female Admission Date (Current Location): 10/02/2016  [facility] and [place] Number:  Producer, television/film/video and Address:  [facility],  [address], [place] [address]      Provider Number: [id number]  Attending Physician Name and Address:  [name], MD  Relative Name and Phone Number:       Current Level of Care: Hospital Recommended Level of Care: Skilled Nursing Facility Prior Approval Number:    Date Approved/Denied:   PASRR Number: [id number]  Discharge Plan: SNF    Current Diagnoses: Patient Active Problem List   Diagnosis Date Noted  . OA (osteoarthritis) of hip 10/02/2016  . Anxiety   . Abdominal discomfort   . Hypercalcemia   . Word finding difficulty   . TIA (transient ischemic attack) 08/11/2016  . MRSA (methicillin resistant Staphylococcus aureus) infection 07/22/2016  . Infection of finger 07/21/2016  . Cellulitis of hand 07/19/2016  . Prediabetes 07/19/2016  . MDD (major depressive disorder), recurrent episode, severe (HCC) 04/01/2016  . Memory difficulties 01/27/2014  . Solitary pulmonary nodule 01/19/2014  . Other specified hypothyroidism 11/23/2013  . Depression 11/23/2013  . Essential hypertension, benign 11/23/2013  . Low urine output 11/23/2013  . Other malaise and fatigue 11/23/2013  . Cough 05/12/2013  . Severe obesity (BMI >= 40) (HCC) 01/08/2013  . ALLERGIC RHINITIS 08/21/2009  . INSOMNIA WITH SLEEP APNEA UNSPECIFIED 08/18/2009  . Hypothyroidism 02/24/2007  . DEPRESSION 02/24/2007  . Essential hypertension 02/24/2007  . GERD 02/24/2007  . LOW BACK PAIN 02/24/2007  . HYPERPARATHYROIDISM, HX OF 02/24/2007    Orientation RESPIRATION BLADDER Height & Weight     Self, Time, Situation, Place  Normal Continent Weight: 197 lb (89.4 kg) Height:  4\' 9"  (144.8 cm)  BEHAVIORAL  SYMPTOMS/MOOD NEUROLOGICAL BOWEL NUTRITION STATUS  Other (Comment) (no behaviors)   Continent Diet  AMBULATORY STATUS COMMUNICATION OF NEEDS Skin   Extensive Assist Verbally Surgical wounds                       Personal Care Assistance Level of Assistance  Bathing, Feeding, Dressing Bathing Assistance: Maximum assistance Feeding assistance: Independent Dressing Assistance: Maximum assistance     Functional Limitations Info  Sight, Hearing, Speech Sight Info: Adequate Hearing Info: Adequate Speech Info: Adequate    SPECIAL CARE FACTORS FREQUENCY  PT (By licensed PT), OT (By licensed OT)     PT Frequency: 5x wk OT Frequency: 5x wk            Contractures Contractures Info: Not present    Additional Factors Info  Code Status Code Status Info: Full Code             Current Medications (10/04/2016):  This is the current hospital active medication list Current Facility-Administered Medications  Medication Dose Route Frequency Provider Last Rate Last Dose  . 0.9 %  sodium chloride infusion   Intravenous Continuous [name], MD 75 mL/hr at 10/02/16 1334    . acetaminophen (TYLENOL) tablet 650 mg  650 mg Oral Q6H PRN [name], MD       Or  . acetaminophen (TYLENOL) suppository 650 mg  650 mg Rectal Q6H PRN May Ozment, Homero Fellers, MD      . aspirin tablet 325 mg  325 mg Oral Daily Kaisyn Reinhold, Homero Fellers, MD      . bisacodyl (DULCOLAX) suppository 10 mg  10 mg Rectal Daily PRN Ollen GrossAluisio, Reshaun Briseno, MD      . clopidogrel (PLAVIX) tablet 75 mg  75 mg Oral Daily Ollen GrossAluisio, Tifany Hirsch, MD   75 mg at 10/03/16 0925  . diphenhydrAMINE (BENADRYL) 12.5 MG/5ML elixir 12.5-25 mg  12.5-25 mg Oral Q4H PRN Jamorris Ndiaye, Homero FellersFrank, MD      . docusate sodium (COLACE) capsule 100 mg  100 mg Oral BID Ollen GrossAluisio, Kaleeyah Cuffie, MD   100 mg at 10/03/16 2130  . DULoxetine (CYMBALTA) DR capsule 30 mg  30 mg Oral Daily Ollen GrossAluisio, Gioia Ranes, MD   30 mg at 10/03/16 0924  . famotidine (PEPCID) tablet 40 mg  40 mg Oral QHS Ollen GrossAluisio,  Frankie Scipio, MD   40 mg at 10/03/16 2129  . levothyroxine (SYNTHROID, LEVOTHROID) tablet 112 mcg  112 mcg Oral QAC breakfast Ollen GrossAluisio, Tylon Kemmerling, MD   112 mcg at 10/04/16 0840  . LORazepam (ATIVAN) tablet 0.5 mg  0.5 mg Oral TID Ollen GrossAluisio, Madysin Crisp, MD   0.5 mg at 10/03/16 2130  . memantine (NAMENDA) tablet 10 mg  10 mg Oral BID Ollen GrossAluisio, Natori Gudino, MD   10 mg at 10/03/16 2130  . menthol-cetylpyridinium (CEPACOL) lozenge 3 mg  1 lozenge Oral PRN Ollen GrossAluisio, Adlai Sinning, MD       Or  . phenol (CHLORASEPTIC) mouth spray 1 spray  1 spray Mouth/Throat PRN Aashish Hamm, Homero FellersFrank, MD      . methocarbamol (ROBAXIN) tablet 500 mg  500 mg Oral Q6H PRN Ollen GrossAluisio, Rossy Virag, MD   500 mg at 10/04/16 0118   Or  . methocarbamol (ROBAXIN) 500 mg in dextrose 5 % 50 mL IVPB  500 mg Intravenous Q6H PRN Ollen GrossAluisio, Khyler Urda, MD 110 mL/hr at 10/02/16 1238 500 mg at 10/02/16 1238  . metoCLOPramide (REGLAN) tablet 5-10 mg  5-10 mg Oral Q8H PRN Lavonne Cass, Homero FellersFrank, MD       Or  . metoCLOPramide (REGLAN) injection 5-10 mg  5-10 mg Intravenous Q8H PRN Muscab Brenneman, Homero FellersFrank, MD      . mirtazapine (REMERON) tablet 30 mg  30 mg Oral QHS Ollen GrossAluisio, Shakena Callari, MD   30 mg at 10/03/16 2130  . morphine 4 MG/ML injection 1 mg  1 mg Intravenous Q2H PRN Ollen GrossAluisio, Amylia Collazos, MD   1 mg at 10/03/16 1049  . ondansetron (ZOFRAN) tablet 4 mg  4 mg Oral Q6H PRN Talayah Picardi, Homero FellersFrank, MD       Or  . ondansetron (ZOFRAN) injection 4 mg  4 mg Intravenous Q6H PRN Ollen GrossAluisio, Mieke Brinley, MD   4 mg at 10/04/16 0841  . oxybutynin (DITROPAN-XL) 24 hr tablet 5 mg  5 mg Oral Daily Ollen GrossAluisio, Kaliann Coryell, MD   5 mg at 10/03/16 0925  . oxyCODONE (Oxy IR/ROXICODONE) immediate release tablet 5-10 mg  5-10 mg Oral Q3H PRN Ollen GrossAluisio, Luree Palla, MD   10 mg at 10/04/16 0118  . polyethylene glycol (MIRALAX / GLYCOLAX) packet 17 g  17 g Oral Daily PRN Elidia Bonenfant, Homero FellersFrank, MD      . QUEtiapine (SEROQUEL) tablet 25 mg  25 mg Oral QHS Ollen GrossAluisio, Sabri Teal, MD   25 mg at 10/03/16 2130  . sodium phosphate (FLEET) 7-19 GM/118ML enema 1 enema  1 enema Rectal Once PRN  Sidhant Helderman, Homero FellersFrank, MD      . sucralfate (CARAFATE) tablet 1 g  1 g Oral BID Ollen GrossAluisio, Vale Mousseau, MD   1 g at 10/03/16 2129     Discharge Medications: Please see discharge summary for a list of discharge medications.  Relevant Imaging Results:  Relevant Lab Results:   Additional Information SS # [id number]  Haidinger, Randall An, LCSW

## 2016-10-04 NOTE — Clinical Social Work Placement (Signed)
   CLINICAL SOCIAL WORK PLACEMENT  NOTE  Date:  10/04/2016  Patient Details  Name: Jody OsierJessie D Rolf MRN: [id number] Date of Birth: 02/05/1936  Clinical Social Work is seeking post-discharge placement for this patient at the Skilled  Nursing Facility level of care (*CSW will initial, date and re-position this form in  chart as items are completed):  Yes   Patient/family provided with [facility] Clinical Social Work Department's list of facilities offering this level of care within the geographic area requested by the patient (or if unable, by the patient's family).  Yes   Patient/family informed of their freedom to choose among providers that offer the needed level of care, that participate in Medicare, Medicaid or managed care program needed by the patient, have an available bed and are willing to accept the patient.  Yes   Patient/family informed of [facility]'s ownership interest in [facility] and [facility], as well as of the fact that they are under no obligation to receive care at these facilities.  PASRR submitted to EDS on 10/04/16     PASRR number received on 10/04/16     Existing PASRR number confirmed on       FL2 transmitted to all facilities in geographic area requested by pt/family on 10/04/16     FL2 transmitted to all facilities within larger geographic area on       Patient informed that his/her managed care company has contracts with or will negotiate with certain facilities, including the following:        Yes   Patient/family informed of bed offers received.  [facility]     Physician recommends and patient chooses bed at      [facility] 10/04/[facility].  Patient to be transferred to facility by PTAR     Patient family notified on 10/04/16 of transfer.  Name of family member notified:        PHYSICIAN       Additional Comment: [facility]. [employer] has provided authorization. Pt / family are in agreement with d/c plan. D/C Summary has been sent to SNF for review. Scripts included in d/c packet. # for report provided to nsg. [facility] transport required. Medical necessity form completed. Daughter is aware out of pocket costs may be associated with [facility] transport.   _______________________________________________ Vennie HomansHaidinger, Darcel Frane Lee, LCSW  [phone] 10/04/2016, 3:36 PM

## 2016-10-04 NOTE — Progress Notes (Signed)
Physical Therapy Treatment Patient Details Name: Jody OsierJessie D Porter MRN: [id number] DOB: 02/22/1936 Today's Date: 10/04/2016    History of Present Illness Patient s/p L THR and with  MH:  anxiety, depression HTN, memory deficits, obesity, arthritis, obesity, Rt TKA, low back pain.    PT Comments    Pt progressing slowly with mobility and limited by pain, fatigue and premorbid deconditioning.   Follow Up Recommendations  SNF;DC plan and follow up therapy as arranged by surgeon     Equipment Recommendations  None recommended by PT    Recommendations for Other Services OT consult     Precautions / Restrictions Precautions Precautions: Fall Restrictions Weight Bearing Restrictions: No Other Position/Activity Restrictions: WBAT    Mobility  Bed Mobility               General bed mobility comments: OOB with nursing  Transfers Overall transfer level: Needs assistance Equipment used: Rolling walker (2 wheeled) Transfers: Sit to/from Stand Sit to Stand: Min assist;+2 physical assistance;+2 safety/equipment         General transfer comment: cues for UE/LE placement  Ambulation/Gait Ambulation/Gait assistance: Min assist;Mod assist;+2 physical assistance;+2 safety/equipment Ambulation Distance (Feet): 18 Feet Assistive device: Rolling walker (2 wheeled) Gait Pattern/deviations: Step-to pattern;Decreased step length - right;Decreased step length - left;Decreased stance time - right;Shuffle;Trunk flexed Gait velocity: decr Gait velocity interpretation: Below normal speed for age/gender General Gait Details: cues for sequence, posture, position from RW and increased UE WB   Stairs            Wheelchair Mobility    Modified Rankin (Stroke Patients Only)       Balance Overall balance assessment: Needs assistance Sitting-balance support: No upper extremity supported;Feet supported Sitting balance-Leahy Scale: Fair     Standing balance support: Bilateral  upper extremity supported Standing balance-Leahy Scale: Poor                              Cognition Arousal/Alertness: Awake/alert Behavior During Therapy: WFL for tasks assessed/performed Overall Cognitive Status: Within Functional Limits for tasks assessed                                        Exercises      General Comments        Pertinent Vitals/Pain Pain Assessment: 0-10 Pain Score: 10-Worst pain ever Pain Location: R hip/thigh/knee Pain Descriptors / Indicators: Burning Pain Intervention(s): Limited activity within patient's tolerance;Monitored during session;Premedicated before session;Ice applied    Home Living Family/patient expects to be discharged to:: Unsure Living Arrangements: Children Available Help at Discharge: Family;Available PRN/intermittently         Home Equipment: Walker - 4 wheels;Walker - 2 wheels;Shower seat;Wheelchair - manual;Cane - single point Additional Comments: Pt lives with son who works long hours.  Pt has stairs to multiple levels in home including bathroom    Prior Function Level of Independence: Independent with assistive device(s);Needs assistance  Gait / Transfers Assistance Needed: Uses RW or cane for ambulation since September 2017 ADL's / Homemaking Assistance Needed: Son prepares most meal and does housekeeping.  Patient able to complete own bathing/dressing except for socks (her son assists with this).     PT Goals (current goals can now be found in the care plan section) Acute Rehab PT Goals Patient Stated Goal: Less pain PT Goal Formulation: With patient Time  For Goal Achievement: 10/08/16 Potential to Achieve Goals: Fair Progress towards PT goals: Progressing toward goals    Frequency    7X/week      PT Plan Current plan remains appropriate    Co-evaluation PT/OT/SLP Co-Evaluation/Treatment: Yes Reason for Co-Treatment: For patient/therapist safety PT goals addressed during  session: Mobility/safety with mobility OT goals addressed during session: ADL's and self-care      AM-PAC PT "6 Clicks" Daily Activity  Outcome Measure  Difficulty turning over in bed (including adjusting bedclothes, sheets and blankets)?: A Lot Difficulty moving from lying on back to sitting on the side of the bed? : A Lot Difficulty sitting down on and standing up from a chair with arms (e.g., wheelchair, bedside commode, etc,.)?: A Lot Help needed moving to and from a bed to chair (including a wheelchair)?: A Lot Help needed walking in hospital room?: A Lot Help needed climbing 3-5 steps with a railing? : Total 6 Click Score: 11    End of Session Equipment Utilized During Treatment: Gait belt Activity Tolerance: Patient limited by pain;Patient limited by fatigue Patient left: in chair;with call bell/phone within reach;with family/visitor present Nurse Communication: Mobility status PT Visit Diagnosis: History of falling (Z91.81);Difficulty in walking, not elsewhere classified (R26.2)     Time: 1610-9604 PT Time Calculation (min) (ACUTE ONLY): 23 min  Charges:  $Gait Training: 8-22 mins                    G Codes:       Pg [phone]     Jody Porter 10/04/2016, 1:25 PM

## 2016-10-04 NOTE — Evaluation (Signed)
Occupational Therapy Evaluation Patient Details Name: Jody Porter MRN: [id number] DOB: Dec 06, 1935 Today's Date: 10/04/2016    History of Present Illness Patient s/p L THR and with  MH:  anxiety, depression HTN, memory deficits, obesity, arthritis, obesity, Rt TKA, low back pain.   Clinical Impression   This 81 year old female was admitted for the above sx. She will benefit from continued OT to increase safety and independence with adls. Goals in acute are for min A overall. She currently needs min +2 for standing and ambulating and up to max A for LB ADLs (+2 sit to stand). She was mostly mod I prior to admission    Follow Up Recommendations  SNF;DC plan and follow up therapy as arranged by surgeon    Equipment Recommendations  3 in 1 bedside commode    Recommendations for Other Services       Precautions / Restrictions Precautions Precautions: Fall Restrictions Other Position/Activity Restrictions: WBAT      Mobility Bed Mobility                  Transfers   Equipment used: Rolling walker (2 wheeled)   Sit to Stand: Min assist;+2 physical assistance;+2 safety/equipment         General transfer comment: cues for UE/LE placement    Balance                                           ADL either performed or assessed with clinical judgement   ADL Overall ADL's : Needs assistance/impaired Eating/Feeding: Independent   Grooming: Set up;Sitting   Upper Body Bathing: Set up;Sitting   Lower Body Bathing: +2 for physical assistance;Sit to/from stand;Moderate assistance   Upper Body Dressing : Minimal assistance;Sitting   Lower Body Dressing: Maximal assistance;Sit to/from stand   Toilet Transfer: Minimal assistance;Ambulation;+2 for safety/equipment;+2 for physical assistance (chair)   Toileting- Clothing Manipulation and Hygiene: Moderate assistance;+2 for physical assistance;+2 for safety/equipment;Sit to/from stand          General ADL Comments: did not introduce AE this visit, but pt would benefit from this.       Vision         Perception     Praxis      Pertinent Vitals/Pain Pain Score: 10-Worst pain ever Pain Location: R hip/thigh/knee Pain Descriptors / Indicators: Burning Pain Intervention(s): Limited activity within patient's tolerance;Monitored during session;Premedicated before session;Repositioned (offered ice)     Hand Dominance     Extremity/Trunk Assessment Upper Extremity Assessment Upper Extremity Assessment: Generalized weakness           Communication Communication Communication: No difficulties   Cognition Arousal/Alertness: Awake/alert Behavior During Therapy: WFL for tasks assessed/performed Overall Cognitive Status: Within Functional Limits for tasks assessed                                     General Comments       Exercises     Shoulder Instructions      Home Living Family/patient expects to be discharged to:: Unsure Living Arrangements: Children Available Help at Discharge: Family;Available PRN/intermittently                         Home Equipment: Walker - 4 wheels;Walker - 2 wheels;Shower seat;Wheelchair -  manual;Cane - single point   Additional Comments: Pt lives with son who works long hours.  Pt has stairs to multiple levels in home including bathroom      Prior Functioning/Environment Level of Independence: Independent with assistive device(s);Needs assistance  Gait / Transfers Assistance Needed: Uses RW or cane for ambulation since September 2017 ADL's / Homemaking Assistance Needed: Son prepares most meal and does housekeeping.  Patient able to complete own bathing/dressing except for socks (her son assists with this).            OT Problem List: Decreased strength;Decreased activity tolerance;Decreased knowledge of use of DME or AE;Pain      OT Treatment/Interventions: Self-care/ADL training;DME and/or AE  instruction;Patient/family education    OT Goals(Current goals can be found in the care plan section) Acute Rehab OT Goals Patient Stated Goal: Less pain OT Goal Formulation: With patient Time For Goal Achievement: 10/11/16 Potential to Achieve Goals: Good ADL Goals Pt Will Perform Grooming: standing;with min guard assist Pt Will Transfer to Toilet: with min assist;ambulating;bedside commode Pt Will Perform Toileting - Clothing Manipulation and hygiene: with mod assist;sit to/from stand Additional ADL Goal #1: pt will use reacher to bathe LB and don pants/underwear with min A, sit to stand  OT Frequency: Min 2X/week   Barriers to D/C:            Co-evaluation              AM-PAC PT "6 Clicks" Daily Activity     Outcome Measure Help from another person eating meals?: None Help from another person taking care of personal grooming?: A Little Help from another person toileting, which includes using toliet, bedpan, or urinal?: A Lot Help from another person bathing (including washing, rinsing, drying)?: A Little Help from another person to put on and taking off regular upper body clothing?: A Little Help from another person to put on and taking off regular lower body clothing?: A Lot 6 Click Score: 17   End of Session Equipment Utilized During Treatment: Gait belt;Rolling walker  Activity Tolerance: Patient limited by fatigue Patient left: in chair;with call bell/phone within reach;with family/visitor present  OT Visit Diagnosis: Pain Pain - Right/Left: Right Pain - part of body: Hip                Time: [id number] OT Time Calculation (min): 22 min Charges:  OT General Charges $OT Visit: 1 Procedure OT Evaluation $OT Eval Low Complexity: 1 Procedure G-Codes:     Jody Porter, Jody Porter [phone] 10/04/2016  Jeno Calleros 10/04/2016, 11:24 AM

## 2016-10-04 NOTE — Progress Notes (Signed)
Plan now for d/c to SNF, discharge planning per CSW. [facility] updated. [phone]

## 2016-10-04 NOTE — Clinical Social Work Placement (Signed)
   CLINICAL SOCIAL WORK PLACEMENT  NOTE  Date:  10/04/2016  Patient Details  Name: Jody Porter MRN: [id number] Date of Birth: 02/09/1936  Clinical Social Work is seeking post-discharge placement for this patient at the Skilled  Nursing Facility level of care (*CSW will initial, date and re-position this form in  chart as items are completed):  Yes   Patient/family provided with [place] Clinical Social Work Department's list of facilities offering this level of care within the geographic area requested by the patient (or if unable, by the patient's family).  Yes   Patient/family informed of their freedom to choose among providers that offer the needed level of care, that participate in Medicare, Medicaid or managed care program needed by the patient, have an available bed and are willing to accept the patient.  Yes   Patient/family informed of [facility]'s ownership interest in [facility] and [facility], as well as of the fact that they are under no obligation to receive care at these facilities.  PASRR submitted to EDS on 10/04/16     PASRR number received on 10/04/16     Existing PASRR number confirmed on       FL2 transmitted to all facilities in geographic area requested by pt/family on 10/04/16     FL2 transmitted to all facilities within larger geographic area on       Patient informed that his/her managed care company has contracts with or will negotiate with certain facilities, including the following:        Yes   Patient/family informed of bed offers received.  Patient [facility] bed at [facility]     Physician recommends and patient chooses bed at      Patient to be transferred to   on  .  Patient to be transferred to facility by       Patient family notified on   of transfer.  Name of family member notified:        PHYSICIAN       Additional Comment:    _______________________________________________ Royetta AsalHaidinger, Saquoia Sianez  Lee, LCSW  [phone] 10/04/2016, 3:06 PM

## 2016-10-04 NOTE — Clinical Social Work Note (Signed)
Clinical Social Work Assessment  Patient Details  Name: Jody Porter MRN: [id number] Date of Birth: 1936/02/03  Date of referral:  10/04/16               Reason for consult:  Facility Placement, Discharge Planning                Permission sought to share information with:  Chartered certified accountant granted to share information::  Yes, Verbal Permission Granted  Name::        Agency::     Relationship::     Contact Information:     Housing/Transportation Living arrangements for the past 2 months:  Single Family Home Source of Information:  Adult Children Patient Interpreter Needed:  None Criminal Activity/Legal Involvement Pertinent to Current Situation/Hospitalization:  No - Comment as needed Significant Relationships:  Adult Children Lives with:  Adult Children Do you feel safe going back to the place where you live?   (SNF recommended.) Need for family participation in patient care:  Yes (Comment)  Care giving concerns:  Pt needs more assistance at home than may be available.   Social Worker assessment / plan:  Pt hospitalized from home on 10/02/16 for pre planned right total hip arthroplasty. Pt had plans to return home at d/c but is progressing slowly and family feels they may not be able to provide needed care. PT has recommended SNF at d/c. CSW consulted today to assist with d/c planning. CSW met with [name] [phone] at bedside. Permission provided to initiate SNF search. Bed offers have been provided and pt / family have chosen [facility]. Pt has [place]. Admissions from SNF as contacted insurance and submitted clinicals requesting authorization. SNF was told by insurance that a decision will most likely not be available until Tuesday. CSW has shared this info with daughter. Daughter feels that pt may be able to pay out of pocket for a few days at the facility while waiting for authorization. SNF has  been updated and is checking with Administration to see if they can accept private pay admission while waiting for authorization. Taking pt home with [facility] services while waiting for authorization is also being considered though family is very concerned about trying to care for pt at home. CSW will continue to follow to assist with d/c planning needs.   Employment status:  Retired Nurse, adult PT Recommendations:  [place] / Referral to community resources:  [place]  Patient/Family's Response to care:  Family feels SNF placement is needed.  Patient/Family's Understanding of and Emotional Response to Diagnosis, Current Treatment, and Prognosis:  Family is aware of pt's medical status. They are hopeful pt can go to rehab prior to returning home and applicate CSW assistance with d/c planning. thought  Emotional Assessment Appearance:  Appears stated age Attitude/Demeanor/Rapport:  Other (cooperative) Affect (typically observed):  Calm Orientation:  Oriented to Self, Oriented to Place Alcohol / Substance use:    Psych involvement (Current and /or in the community):  No (Comment)  Discharge Needs  Concerns to be addressed:  Discharge Planning Concerns Readmission within the last 30 days:  No Current discharge risk:  None Barriers to Discharge:  Insurance Authorization   Jody Porter  [phone] 10/04/2016, 2:39 PM

## 2016-10-04 NOTE — Discharge Summary (Signed)
Physician Discharge Summary  Patient ID: Jody Porter MRN: [id number] DOB/AGE: 79/05/1935 [age] y.o.  Admit date: 10/02/2016 Discharge date: 10/04/2016  Admission Diagnoses:Osteoarthritis right hip  Discharge Diagnoses:  Principal Problem:   OA (osteoarthritis) of hip   Discharged Condition: good  Hospital Course: The patient was admitted on 5/23 via the operating room at which time she underwent a right Total Hip Arthroplasty without complications. She was admitted to the Orthopaedic floor and remained in stable hemodynamic condition throughout her hospital stay. She progressed very slowly with physical therapy and it was felt that she would benefit from placement in a skilled nursing facility temporarily for rehab purposes. Her pain was still moderate on 5/25 but improving. Her hemoglobin is 8.9 and she is asymptomatic. Her incision is well healed without erythema or exudate. She may shower as of 5/26.  She is in stable condition tolerating a regular diet at the time of this summary  Consults: None  Significant Diagnostic Studies: labs: per report  Treatments: surgery: Physical and occupational therapy  Discharge Exam: Blood pressure (!) 164/69, pulse 96, temperature 98.1 F (36.7 C), temperature source Oral, resp. rate 18, height 4\' 9"  (1.448 m), weight 89.4 kg (197 lb), SpO2 96 %. General appearance: alert and cooperative Resp: clear to auscultation bilaterally Cardio: regular rate and rhythm, S1, S2 normal, no murmur, click, rub or gallop GI: soft, non-tender; bowel sounds normal; no masses,  no organomegaly Incision/Wound: Well healed without erythema or exudate  Disposition: SNF  Allergies as of 10/04/2016      Reactions   Aricept [donepezil Hcl] Other (See Comments)   Nightmares   Sudafed [pseudoephedrine Hcl] Other (See Comments)   Hyperactive and agitated Hyperactive and agitated, congestion   Sugar-protein-starch Other (See Comments)   Loses voice, congestion   Ciprofloxacin Other (See Comments)   Stomach pains   Prednisone Other (See Comments)   Marked mentation/ mood change   Valium [diazepam] Other (See Comments)   Altered mental status changes-significant problems   Hydroxyzine Other (See Comments)   Hallucinations   Mupirocin Other (See Comments)   Flushing, severe coughing, gagging   Other    PT IS OF [employer]EHOVAH WITNESS FAITH. NO BLOOD PRODUCTS.    Acyclovir Rash   Penicillins Itching, Rash   Has patient had a PCN reaction causing immediate rash, facial/tongue/throat swelling, SOB or lightheadedness with hypotension: Yes Has patient had a PCN reaction causing severe rash involving mucus membranes or skin necrosis: No Has patient had a PCN reaction that required hospitalization No Has patient had a PCN reaction occurring within the last 10 years: No If all of the above answers are "NO", then may proceed with Cephalosporin use.      Medication List    STOP taking these medications   HYDROcodone-acetaminophen 5-325 MG tablet Commonly known as:  NORCO/VICODIN     TAKE these medications   acetaminophen 500 MG tablet Commonly known as:  TYLENOL Take 1,000 mg by mouth 2 (two) times daily as needed for mild pain.   aspirin 325 MG tablet Take 1 tablet (325 mg total) by mouth daily.   carboxymethylcellulose 0.5 % Soln Commonly known as:  REFRESH PLUS Place 1 drop into both eyes 2 (two) times daily as needed (dry eyes).   clopidogrel 75 MG tablet Commonly known as:  PLAVIX Take 1 tablet (75 mg total) by mouth daily.   doxycycline 100 MG capsule Commonly known as:  VIBRAMYCIN Take 1 capsule (100 mg total) by mouth 2 (two) times daily.  DULoxetine 30 MG capsule Commonly known as:  CYMBALTA Take 1 capsule by mouth daily.   HAIR/SKIN/NAILS Caps Take 1 tablet by mouth daily.   levothyroxine 112 MCG tablet Commonly known as:  SYNTHROID, LEVOTHROID TAKE 1 TABLET (112 MCG TOTAL) BY MOUTH DAILY.   LORazepam 0.5 MG tablet Commonly  known as:  ATIVAN Take 0.5 mg by mouth 3 (three) times daily.   memantine 10 MG tablet Commonly known as:  NAMENDA Take 10 mg by mouth See admin instructions. Take 1 tablet daily for 2 weeks then start 1 tablet twice daily   2 week period is over 09-26-16 and will start 1 tablet 2 times daily   methocarbamol 500 MG tablet Commonly known as:  ROBAXIN Take 1 tablet (500 mg total) by mouth every 6 (six) hours as needed for muscle spasms.   mirtazapine 30 MG tablet Commonly known as:  REMERON Take 30 mg by mouth at bedtime.   omega-3 acid ethyl esters 1 g capsule Commonly known as:  LOVAZA Take 1 capsule by mouth daily.   oxybutynin 5 MG 24 hr tablet Commonly known as:  DITROPAN-XL Take 1 tablet by mouth daily.   oxyCODONE 5 MG immediate release tablet Commonly known as:  Oxy IR/ROXICODONE Take 1-2 tablets (5-10 mg total) by mouth every 4 (four) hours as needed for breakthrough pain.   pravastatin 40 MG tablet Commonly known as:  PRAVACHOL Take 1 tablet (40 mg total) by mouth daily.   QUEtiapine 25 MG tablet Commonly known as:  SEROQUEL Take 25 mg by mouth at bedtime.   ranitidine 300 MG tablet Commonly known as:  ZANTAC Take 300 mg by mouth at bedtime.   sucralfate 1 g tablet Commonly known as:  CARAFATE TAKE 1 TABLET (1 GRAM) BY MOUTH tWICE DAILY            Durable Medical Equipment        Start     Ordered   10/03/16 1128  For home use only DME 3 n 1  Once     10/03/16 1128     Follow-up Information    Laticha Ferrucci, Homero Fellers, MD. Schedule an appointment as soon as possible for a visit on 10/15/2016.   Specialty:  Orthopedic Surgery Why:  Call [phone] tomorrow to make the appointment Contact information: [address] [place] [address] [phone]        [facility] Follow up.   Why:  physical therapy Contact information: [address] [place] [address] [phone]        [facility] -  Dme Follow up.   Why:  bedside commode Contact information: [address] [place] [address] [phone]           Signed: Loanne Drilling 10/04/2016, 1:21 PM

## 2016-10-04 NOTE — Progress Notes (Signed)
   Subjective: 2 Days Post-Op Procedure(s) (LRB): RIGHT TOTAL HIP ARTHROPLASTY ANTERIOR APPROACH (Right) Patient reports pain as moderate.   Plan is to go Skilled nursing facility after hospital stay.  Objective: Vital signs in last 24 hours: Temp:  [98.1 F (36.7 C)-98.7 F (37.1 C)] 98.1 F (36.7 C) (05/25 0715) Pulse Rate:  [74-96] 96 (05/25 0715) Resp:  [15-18] 18 (05/25 0715) BP: (116-164)/(42-73) 164/69 (05/25 0715) SpO2:  [93 %-96 %] 96 % (05/25 0715)  Intake/Output from previous day:  Intake/Output Summary (Last 24 hours) at 10/04/16 0806 Last data filed at 10/04/16 0600  Gross per 24 hour  Intake              960 ml  Output              575 ml  Net              385 ml    Intake/Output this shift: No intake/output data recorded.  Labs:  Recent Labs  10/03/16 0422 10/04/16 0423  HGB 9.5* 8.6*    Recent Labs  10/03/16 0422 10/04/16 0423  WBC 6.7 7.7  RBC 3.37* 3.12*  HCT 29.2* 26.7*  PLT 165 149*    Recent Labs  10/03/16 0422 10/04/16 0423  NA 137 136  K 4.2 3.9  CL 105 103  CO2 26 26  BUN 14 10  CREATININE 0.68 0.69  GLUCOSE 142* 154*  CALCIUM 8.2* 8.3*   No results for input(s): LABPT, INR in the last 72 hours.  EXAM General - Patient is Alert, Appropriate and Oriented Extremity - Neurologically intact Neurovascular intact No cellulitis present Compartment soft Dressing/Incision - clean, dry, no drainage Motor Function - intact, moving foot and toes well on exam.   Past Medical History:  Diagnosis Date  . Allergic rhinitis   . Anxiety    hospitalized 03/2016 per daughter   . Degenerative arthritis   . Depression   . Dyspnea    with exertion   . Finger infection    07/2016 for mrsa per daughter but has cleared   . GERD (gastroesophageal reflux disease)   . Hyperparathyroidism   . Hypertension   . Hypothyroid   . Insomnia    w/ sleep apnea NPSG 07/13/2009 AHI 3.5, RDI 29.9/hr  . LBP (low back pain)   . Macular edema    OD    . Memory difficulties 01/27/2014  . Obesity   . Pre-diabetes   . Prediabetes   . Sleep apnea   . Urinary incontinence     Assessment/Plan: 2 Days Post-Op Procedure(s) (LRB): RIGHT TOTAL HIP ARTHROPLASTY ANTERIOR APPROACH (Right) Principal Problem:   OA (osteoarthritis) of hip   D/C IV fluids  Progressing very slowly. Will need SNF  DVT Prophylaxis - Xarelto Weight Bearing As Tolerated right Leg  Jeramy Dimmick V 10/04/2016, 8:06 AM

## 2016-10-08 ENCOUNTER — Encounter: Payer: Self-pay | Admitting: Internal Medicine

## 2016-10-08 ENCOUNTER — Non-Acute Institutional Stay (SKILLED_NURSING_FACILITY): Payer: 59 | Admitting: Internal Medicine

## 2016-10-08 DIAGNOSIS — Z96641 Presence of right artificial hip joint: Secondary | ICD-10-CM | POA: Diagnosis not present

## 2016-10-08 DIAGNOSIS — G309 Alzheimer's disease, unspecified: Secondary | ICD-10-CM

## 2016-10-08 DIAGNOSIS — F329 Major depressive disorder, single episode, unspecified: Secondary | ICD-10-CM | POA: Diagnosis not present

## 2016-10-08 DIAGNOSIS — F0281 Dementia in other diseases classified elsewhere with behavioral disturbance: Secondary | ICD-10-CM | POA: Diagnosis not present

## 2016-10-08 DIAGNOSIS — D62 Acute posthemorrhagic anemia: Secondary | ICD-10-CM

## 2016-10-08 DIAGNOSIS — M1611 Unilateral primary osteoarthritis, right hip: Secondary | ICD-10-CM | POA: Diagnosis not present

## 2016-10-08 DIAGNOSIS — F32A Depression, unspecified: Secondary | ICD-10-CM

## 2016-10-08 DIAGNOSIS — K219 Gastro-esophageal reflux disease without esophagitis: Secondary | ICD-10-CM | POA: Diagnosis not present

## 2016-10-08 DIAGNOSIS — N3281 Overactive bladder: Secondary | ICD-10-CM | POA: Diagnosis not present

## 2016-10-08 DIAGNOSIS — E785 Hyperlipidemia, unspecified: Secondary | ICD-10-CM

## 2016-10-08 NOTE — Progress Notes (Signed)
: Provider:  Randon Goldsmith. Lyn Hollingshead, MD Location:  [facility] Room Number: [phone] Place of Service:  SNF ([phone])   PCP: Renaye Rakers, MD Patient Care Team: Renaye Rakers, MD as PCP - General (Family Medicine)  Extended Emergency Contact Information Primary Emergency Contact: Younts,Delores Address: [phone]          [place] [address] [place] Home Phone: [phone] Relation: Daughter Secondary Emergency Contact: Pellman,Jeffrey  [place] Mobile Phone: [phone] Relation: Son     Allergies: Aricept [donepezil hcl]; Sudafed [pseudoephedrine hcl]; Sugar-protein-starch; Ciprofloxacin; Prednisone; Valium [diazepam]; Hydroxyzine; Mupirocin; Other; Acyclovir; and Penicillins  Chief Complaint  Patient presents with  . New Admit To SNF    Admit to Facility    HPI: Patient is 81 y.o. female was admitted on 5/23 via the operating room at which time she underwent a right Total Hip Arthroplasty without complications. She was admitted to [facility] form 5/22-mor and remained in stable hemodynamic condition throughout her hospital stay. She progressed very slowly with physical therapy and it was felt that she would benefit from placement in a skilled nursing facility temporarily for rehab purposes. Her pain was still moderate on 5/25 but improving. Her hemoglobin is 8.9 and she is asymptomatic. Her incision is well healed without erythema or exudate. She may shower as of 5/26.  She is in stable condition tolerating a regular diet at the time of this summary. Pt is admitted to SNF for OT/PT. While at SNF pt will be followed for depression, tx with cymbalta and remeron, dementia, tx with namenda and OAB tx with oxybutynin.   Past Medical History:  Diagnosis Date  . Allergic rhinitis   . Anxiety    hospitalized 03/2016 per daughter   . Degenerative arthritis   . Depression   . Dyspnea    with exertion   . Finger infection    07/2016 for  mrsa per daughter but has cleared   . GERD (gastroesophageal reflux disease)   . Hyperparathyroidism   . Hypertension   . Hypothyroid   . Insomnia    w/ sleep apnea NPSG 07/13/2009 AHI 3.5, RDI 29.9/hr  . LBP (low back pain)   . Macular edema    OD  . Memory difficulties 01/27/2014  . Obesity   . Pre-diabetes   . Prediabetes   . Sleep apnea   . Urinary incontinence     Past Surgical History:  Procedure Laterality Date  . carpel tunnel Bilateral   . cataracts    . CESAREAN SECTION    . CHOLECYSTECTOMY    . eyelid surgery     . gas bubble in right eye for partial retinal detachment surgery     . I&D EXTREMITY Left 07/19/2016   Procedure: IRRIGATION AND DEBRIDEMENT LEFT LONG FINGER;  Surgeon: Betha Loa, MD;  Location: [facility];  Service: Orthopedics;  Laterality: Left;  . KNEE SURGERY Right    reports multiple surgery on knee  . LUMBAR DISC SURGERY    . PARATHYROIDECTOMY    . TEE WITHOUT CARDIOVERSION N/A 08/14/2016   Procedure: TRANSESOPHAGEAL ECHOCARDIOGRAM (TEE);  Surgeon: Chrystie Nose, MD;  Location: [facility] ENDOSCOPY;  Service: Cardiovascular;  Laterality: N/A;  . TOTAL HIP ARTHROPLASTY Right 10/02/2016   Procedure: RIGHT TOTAL HIP ARTHROPLASTY ANTERIOR APPROACH;  Surgeon: [name], MD;  Location: [facility];  Service: Orthopedics;  Laterality: Right;    Allergies as of 10/08/2016      Reactions  Aricept [donepezil Hcl] Other (See Comments)   Nightmares   Sudafed [pseudoephedrine Hcl] Other (See Comments)   Hyperactive and agitated Hyperactive and agitated, congestion   Sugar-protein-starch Other (See Comments)   Loses voice, congestion   Ciprofloxacin Other (See Comments)   Stomach pains   Prednisone Other (See Comments)   Marked mentation/ mood change   Valium [diazepam] Other (See Comments)   Altered mental status changes-significant problems   Hydroxyzine Other (See Comments)   Hallucinations   Mupirocin Other (See Comments)   Flushing, severe coughing, gagging     Other    PT IS OF [employer]ITNESS FAITH. NO BLOOD PRODUCTS.    Acyclovir Rash   Penicillins Itching, Rash   Has patient had a PCN reaction causing immediate rash, facial/tongue/throat swelling, SOB or lightheadedness with hypotension: Yes Has patient had a PCN reaction causing severe rash involving mucus membranes or skin necrosis: No Has patient had a PCN reaction that required hospitalization No Has patient had a PCN reaction occurring within the last 10 years: No If all of the above answers are "NO", then may proceed with Cephalosporin use.      Medication List       Accurate as of 10/08/16 11:09 AM. Always use your most recent med list.          acetaminophen 500 MG tablet Commonly known as:  TYLENOL Take 1,000 mg by mouth 2 (two) times daily as needed for mild pain.   aspirin 325 MG tablet Take 1 tablet (325 mg total) by mouth daily.   carboxymethylcellulose 0.5 % Soln Commonly known as:  REFRESH PLUS Place 1 drop into both eyes 2 (two) times daily as needed (dry eyes).   clopidogrel 75 MG tablet Commonly known as:  PLAVIX Take 1 tablet (75 mg total) by mouth daily.   doxycycline 100 MG capsule Commonly known as:  VIBRAMYCIN Take 1 capsule (100 mg total) by mouth 2 (two) times daily.   DULoxetine 30 MG capsule Commonly known as:  CYMBALTA Take 1 capsule by mouth daily.   HAIR/SKIN/NAILS Caps Take 1 tablet by mouth daily.   levothyroxine 112 MCG tablet Commonly known as:  SYNTHROID, LEVOTHROID TAKE 1 TABLET (112 MCG TOTAL) BY MOUTH DAILY.   LORazepam 0.5 MG tablet Commonly known as:  ATIVAN Take 0.5 mg by mouth 3 (three) times daily.   memantine 10 MG tablet Commonly known as:  NAMENDA Take 10 mg by mouth See admin instructions. Take 1 tablet daily for 2 weeks then start 1 tablet twice daily   2 week period is over 09-26-16 and will start 1 tablet 2 times daily   methocarbamol 500 MG tablet Commonly known as:  ROBAXIN Take 1 tablet (500 mg total) by  mouth every 6 (six) hours as needed for muscle spasms.   mirtazapine 30 MG tablet Commonly known as:  REMERON Take 30 mg by mouth at bedtime.   omega-3 acid ethyl esters 1 g capsule Commonly known as:  LOVAZA Take 1 capsule by mouth daily.   oxybutynin 5 MG 24 hr tablet Commonly known as:  DITROPAN-XL Take 1 tablet by mouth daily.   oxyCODONE 5 MG immediate release tablet Commonly known as:  Oxy IR/ROXICODONE Take 1-2 tablets (5-10 mg total) by mouth every 4 (four) hours as needed for breakthrough pain.   pravastatin 40 MG tablet Commonly known as:  PRAVACHOL Take 1 tablet (40 mg total) by mouth daily.   QUEtiapine 25 MG tablet Commonly known as:  SEROQUEL Take  25 mg by mouth at bedtime.   ranitidine 300 MG tablet Commonly known as:  ZANTAC Take 300 mg by mouth at bedtime.   sucralfate 1 g tablet Commonly known as:  CARAFATE TAKE 1 TABLET (1 GRAM) BY MOUTH tWICE DAILY       No orders of the defined types were placed in this encounter.   Immunization History  Administered Date(s) Administered  . Pneumococcal Polysaccharide-23 05/14/2007, 04/14/2013  . Td 05/15/1998    Social History  Substance Use Topics  . Smoking status: Never Smoker  . Smokeless tobacco: Never Used  . Alcohol use No    Family history is   Family History  Problem Relation Age of Onset  . Cancer Mother   . Seizures Mother        grand mal  . Cancer Father        prostate  . Arthritis/Rheumatoid Sister   . Cancer Paternal Grandmother   . Cancer Maternal Grandmother       Review of Systems  DATA OBTAINED: from patient, nurse GENERAL:  no fevers, fatigue, appetite changes SKIN: No itching, or rash EYES: No eye pain, redness, discharge EARS: No earache, tinnitus, change in hearing NOSE: No congestion, drainage or bleeding  MOUTH/THROAT: No mouth or tooth pain, No sore throat RESPIRATORY: No cough, wheezing, SOB CARDIAC: No chest pain, palpitations, lower extremity edema  GI:  No abdominal pain, No N/V/D or constipation, No heartburn or reflux  GU: No dysuria, frequency or urgency, or incontinence  MUSCULOSKELETAL: No unrelieved bone/joint pain NEUROLOGIC: No headache, dizziness or focal weakness PSYCHIATRIC: No c/o anxiety or sadness   Vitals:   10/08/16 1033  BP: 131/73  Pulse: 89  Resp: 18  Temp: 97.7 F (36.5 C)    SpO2 Readings from Last 1 Encounters:  10/04/16 94%   Body mass index is 43.63 kg/m.     Physical Exam  GENERAL APPEARANCE: Alert, conversant,  No acute distress.  SKIN: No diaphoresis rash HEAD: Normocephalic, atraumatic  EYES: Conjunctiva/lids clear. Pupils round, reactive. EOMs intact.  EARS: External exam WNL, canals clear. Hearing grossly normal.  NOSE: No deformity or discharge.  MOUTH/THROAT: Lips w/o lesions  RESPIRATORY: Breathing is even, unlabored. Lung sounds are clear   CARDIOVASCULAR: Heart RRR no murmurs, rubs or gallops. No peripheral edema.   GASTROINTESTINAL: Abdomen is soft, non-tender, not distended w/ normal bowel sounds. GENITOURINARY: Bladder non tender, not distended  MUSCULOSKELETAL: No abnormal joints or musculature NEUROLOGIC:  Cranial nerves 2-12 grossly intact. Moves all extremities  PSYCHIATRIC: Mood and affect appropriate to situation, no behavioral issues  Patient Active Problem List   Diagnosis Date Noted  . OA (osteoarthritis) of hip 10/02/2016  . Anxiety   . Abdominal discomfort   . Hypercalcemia   . Word finding difficulty   . TIA (transient ischemic attack) 08/11/2016  . MRSA (methicillin resistant Staphylococcus aureus) infection 07/22/2016  . Infection of finger 07/21/2016  . Cellulitis of hand 07/19/2016  . Prediabetes 07/19/2016  . MDD (major depressive disorder), recurrent episode, severe (HCC) 04/01/2016  . Memory difficulties 01/27/2014  . Solitary pulmonary nodule 01/19/2014  . Other specified hypothyroidism 11/23/2013  . Depression 11/23/2013  . Essential hypertension,  benign 11/23/2013  . Low urine output 11/23/2013  . Other malaise and fatigue 11/23/2013  . Cough 05/12/2013  . Severe obesity (BMI >= 40) (HCC) 01/08/2013  . ALLERGIC RHINITIS 08/21/2009  . INSOMNIA WITH SLEEP APNEA UNSPECIFIED 08/18/2009  . Hypothyroidism 02/24/2007  . DEPRESSION 02/24/2007  . Essential  hypertension 02/24/2007  . GERD 02/24/2007  . LOW BACK PAIN 02/24/2007  . HYPERPARATHYROIDISM, HX OF 02/24/2007      Labs reviewed: Basic Metabolic Panel:    Component Value Date/Time   NA 136 10/04/2016 0423   NA 136 (A) 10/04/2016   K 3.9 10/04/2016 0423   CL 103 10/04/2016 0423   CO2 26 10/04/2016 0423   GLUCOSE 154 (H) 10/04/2016 0423   BUN 10 10/04/2016 0423   BUN 10 10/04/2016   CREATININE 0.69 10/04/2016 0423   CALCIUM 8.3 (L) 10/04/2016 0423   CALCIUM 10.3 02/24/2007 2252   PROT 7.0 09/24/2016 1120   PROT 6.6 04/14/2014 1511   ALBUMIN 3.9 09/24/2016 1120   ALBUMIN 3.9 04/14/2014 1511   AST 30 09/24/2016 1120   ALT 22 09/24/2016 1120   ALKPHOS 127 (H) 09/24/2016 1120   BILITOT 0.4 09/24/2016 1120   GFRNONAA >60 10/04/2016 0423   GFRAA >60 10/04/2016 0423     Recent Labs  07/20/16 0038  09/24/16 1120 10/03/16 10/03/16 0422 10/04/16 10/04/16 0423  NA 137  < > 138 137 137 136* 136  K 3.6  < > 4.1 4.2 4.2  --  3.9  CL 106  < > 103  --  105  --  103  CO2 24  < > 25  --  26  --  26  GLUCOSE 156*  < > 129*  --  142*  --  154*  BUN 19  < > 15 14 14 10 10   CREATININE 0.84  < > 0.87 0.7 0.68 0.7 0.69  CALCIUM 8.6*  < > 9.3  --  8.2*  --  8.3*  MG 1.7  --   --   --   --   --   --   PHOS 3.2  --   --   --   --   --   --   < > = values in this interval not displayed. Liver Function Tests:  Recent Labs  08/11/16 0351 08/14/16 08/14/16 0408 09/24/16 1120  AST 28 27 27 30   ALT 15 19 19 22   ALKPHOS 94 98 98 127*  BILITOT 0.9  --  0.5 0.4  PROT 6.2*  --  6.0* 7.0  ALBUMIN 3.8  --  3.4* 3.9    Recent Labs  02/28/16 0229 08/10/16 2127  LIPASE 23 21    No results for input(s): AMMONIA in the last 8760 hours. CBC:  Recent Labs  02/28/16 0229  07/19/16 1725  09/24/16 1120 10/03/16 10/03/16 0422 10/04/16 10/04/16 0423  WBC 11.6*  < > 10.6*  < > 7.8 6.7 6.7 7.7 7.7  NEUTROABS 7.8*  --  6.9  --   --   --   --   --   --   HGB 14.6  < > 12.9  < > 13.0 9.5* 9.5*  --  8.6*  HCT 44.0  < > 40.1  < > 41.1 29* 29.2*  --  26.7*  MCV 86.1  < > 85.3  < > 86.5  --  86.6  --  85.6  PLT 262  < > 200  < > 239 165 165  --  149*  < > = values in this interval not displayed. Lipid  Recent Labs  08/11/16 0350  CHOL 187  HDL 58  LDLCALC 90  TRIG 194*    Cardiac Enzymes:  Recent Labs  10/17/15 1219  CKTOTAL 156   BNP: No results for input(s):  BNP in the last 8760 hours. No results found for: [facility] Results  Component Value Date   HGBA1C 6.7 (H) 09/24/2016   Lab Results  Component Value Date   TSH 2.662 07/20/2016   Lab Results  Component Value Date   VITAMINB12 695 01/27/2014   No results found for: FOLATE No results found for: IRON, TIBC, FERRITIN  Imaging and Procedures obtained prior to SNF admission: No results found.   Not all labs, radiology exams or other studies done during hospitalization come through on my EPIC note; however they are reviewed by me.    Assessment and Plan  R HIP OA/ R HIP ARTHROPLASTY -ASA 325 mg daily for prophylaxis SNF - admitted for OT/PT;cont ASA as prophylaxis; pt sent out on Doxy 100 mg BID for an unspecified time; no info in notes; will cont for 7 full fays  ABLA ;Hb 13 -> 8.9; no transfusion SNF - f/u CBC  DEPRESSION SNF - not reported as unstable; cont cymbalta30 mg daily and remeron 30 mg qHS  DEMENTIA SNF - cont namenda 10 mg BID  OAB SNF - not stated as uncontrolled; cont oxybutynin 24 hr 5 mg daily  GERD SNF - Cont zantac 300 mg qHS and carafate 1 gm BID  HLD SNF - cont pravachol 40 mg daily   Time spent > 45 min;> 50% of time with patient was spent  reviewing records, labs, tests and studies, counseling and developing plan of care  Thurston Holenne D. Lyn HollingsheadAlexander, MD

## 2016-10-09 LAB — BASIC METABOLIC PANEL
BUN: 10 (ref 4–21)
Creatinine: 0.5 (ref 0.5–1.1)
Glucose: 123
Potassium: 3.6 (ref 3.4–5.3)
Sodium: 142 (ref 137–147)

## 2016-10-09 LAB — CBC AND DIFFERENTIAL
HCT: 27 — AB (ref 36–46)
HEMOGLOBIN: 8.6 — AB (ref 12.0–16.0)
PLATELETS: 256 (ref 150–399)
WBC: 7

## 2016-10-12 ENCOUNTER — Encounter: Payer: Self-pay | Admitting: Internal Medicine

## 2016-10-12 DIAGNOSIS — E785 Hyperlipidemia, unspecified: Secondary | ICD-10-CM | POA: Insufficient documentation

## 2016-10-12 DIAGNOSIS — D62 Acute posthemorrhagic anemia: Secondary | ICD-10-CM | POA: Insufficient documentation

## 2016-10-12 DIAGNOSIS — N3281 Overactive bladder: Secondary | ICD-10-CM | POA: Insufficient documentation

## 2016-10-12 DIAGNOSIS — Z96641 Presence of right artificial hip joint: Secondary | ICD-10-CM | POA: Insufficient documentation

## 2016-10-12 DIAGNOSIS — F03918 Unspecified dementia, unspecified severity, with other behavioral disturbance: Secondary | ICD-10-CM | POA: Insufficient documentation

## 2016-10-12 DIAGNOSIS — F0391 Unspecified dementia with behavioral disturbance: Secondary | ICD-10-CM | POA: Insufficient documentation

## 2016-10-14 NOTE — Addendum Note (Signed)
Addendum  created 10/14/16 1445 by Jalina Blowers, MD   Sign clinical note    

## 2016-10-14 NOTE — Addendum Note (Signed)
Addendum  created 10/14/16 1212 by Quynh Basso, MD   Sign clinical note    

## 2016-10-14 NOTE — Anesthesia Postprocedure Evaluation (Signed)
Anesthesia Post Note  Patient: Jody OsierJessie D Porter  Procedure(s) Performed: Procedure(s) (LRB): RIGHT TOTAL HIP ARTHROPLASTY ANTERIOR APPROACH (Right)     Anesthesia Post Evaluation  Last Vitals:  Vitals:   10/04/16 0715 10/04/16 1348  BP: (!) 164/69 117/64  Pulse: 96 91  Resp: 18 18  Temp: 36.7 C 37.1 C    Last Pain:  Vitals:   10/04/16 1348  TempSrc: Oral  PainSc:                  Edilia Ghuman S

## 2016-10-14 NOTE — Anesthesia Postprocedure Evaluation (Signed)
Anesthesia Post Note  Patient: Jody OsierJessie D Porter  Procedure(s) Performed: Procedure(s) (LRB): IRRIGATION AND DEBRIDEMENT LEFT LONG FINGER (Left)     Anesthesia Post Evaluation  Last Vitals:  Vitals:   07/21/16 2136 07/22/16 0517  BP: (!) 148/62 (!) 179/69  Pulse: 88 82  Resp: 19 19  Temp: 37.6 C 36.8 C    Last Pain:  Vitals:   07/22/16 0517  TempSrc: Oral  PainSc:                  Drue Camera S

## 2016-10-23 ENCOUNTER — Non-Acute Institutional Stay (SKILLED_NURSING_FACILITY): Payer: 59 | Admitting: Internal Medicine

## 2016-10-23 ENCOUNTER — Encounter: Payer: Self-pay | Admitting: Internal Medicine

## 2016-10-23 DIAGNOSIS — D62 Acute posthemorrhagic anemia: Secondary | ICD-10-CM | POA: Diagnosis not present

## 2016-10-23 DIAGNOSIS — E785 Hyperlipidemia, unspecified: Secondary | ICD-10-CM

## 2016-10-23 DIAGNOSIS — F0281 Dementia in other diseases classified elsewhere with behavioral disturbance: Secondary | ICD-10-CM

## 2016-10-23 DIAGNOSIS — M1611 Unilateral primary osteoarthritis, right hip: Secondary | ICD-10-CM | POA: Diagnosis not present

## 2016-10-23 DIAGNOSIS — N3281 Overactive bladder: Secondary | ICD-10-CM

## 2016-10-23 DIAGNOSIS — F32A Depression, unspecified: Secondary | ICD-10-CM

## 2016-10-23 DIAGNOSIS — Z96641 Presence of right artificial hip joint: Secondary | ICD-10-CM

## 2016-10-23 DIAGNOSIS — K219 Gastro-esophageal reflux disease without esophagitis: Secondary | ICD-10-CM | POA: Diagnosis not present

## 2016-10-23 DIAGNOSIS — G309 Alzheimer's disease, unspecified: Secondary | ICD-10-CM

## 2016-10-23 DIAGNOSIS — F329 Major depressive disorder, single episode, unspecified: Secondary | ICD-10-CM

## 2016-10-23 NOTE — Progress Notes (Signed)
Location:  [facility] Room Number: 101P Place of Service:  SNF (31) Randon Goldsmith. Lyn Hollingshead, MD  PCP: Renaye Rakers, MD Patient Care Team: Renaye Rakers, MD as PCP - General [facility])  Extended Emergency Contact Information Primary Emergency Contact: Younts,Delores Address: [phone]          [place] [address] [place] Home Phone: [phone] Relation: Daughter Secondary Emergency Contact: Robarge,Jeffrey  [place] Mobile Phone: [phone] Relation: Son  Allergies  Allergen Reactions  . Aricept [Donepezil Hcl] Other (See Comments)    Nightmares  . Sudafed [Pseudoephedrine Hcl] Other (See Comments)    Hyperactive and agitated Hyperactive and agitated, congestion   . Sugar-Protein-Starch Other (See Comments)    Loses voice, congestion  . Ciprofloxacin Other (See Comments)    Stomach pains  . Prednisone Other (See Comments)    Marked mentation/ mood change  . Valium [Diazepam] Other (See Comments)    Altered mental status changes-significant problems  . Hydroxyzine Other (See Comments)    Hallucinations   . Mupirocin Other (See Comments)    Flushing, severe coughing, gagging  . Other     PT IS OF JEHOVAH WITNESS FAITH. NO BLOOD PRODUCTS.   Marland Kitchen Acyclovir Rash  . Penicillins Itching and Rash    Has patient had a PCN reaction causing immediate rash, facial/tongue/throat swelling, SOB or lightheadedness with hypotension: Yes Has patient had a PCN reaction causing severe rash involving mucus membranes or skin necrosis: No Has patient had a PCN reaction that required hospitalization No Has patient had a PCN reaction occurring within the last 10 years: No If all of the above answers are "NO", then may proceed with Cephalosporin use.     Chief Complaint  Patient presents with  . Discharge Note    Discharged from SNF    HPI:  81 y.o. female was admitted on 5/23 via the operating room at which time she  underwent a right Total Hip Arthroplasty without complications. She was admitted to [facility] form 5/22-mor and remained in stable hemodynamic condition throughout her hospital stay. She progressed very slowly with physical therapy and it was felt that she would benefit from placement in a skilled nursing facility temporarily for rehab purposes. Her pain was still moderate on 5/25 but improving. Her hemoglobin is 8.9 and she is asymptomatic. Her incision is well healed without erythema or exudate. She may shower as of 5/26. She is in stable condition tolerating a regular diet at the time of this summary. Pt was admitted to SNF for OT/PT and is now ready to be d/c to home.    Past Medical History:  Diagnosis Date  . Allergic rhinitis   . Anxiety    hospitalized 03/2016 per daughter   . Degenerative arthritis   . Depression   . Dyspnea    with exertion   . Finger infection    07/2016 for mrsa per daughter but has cleared   . GERD (gastroesophageal reflux disease)   . Hyperparathyroidism   . Hypertension   . Hypothyroid   . Insomnia    w/ sleep apnea NPSG 07/13/2009 AHI 3.5, RDI 29.9/hr  . LBP (low back pain)   . Macular edema    OD  . Memory difficulties 01/27/2014  . Obesity   . Pre-diabetes   . Prediabetes   . Sleep apnea   . Urinary incontinence     Past Surgical History:  Procedure Laterality Date  . carpel  tunnel Bilateral   . cataracts    . CESAREAN SECTION    . CHOLECYSTECTOMY    . eyelid surgery     . gas bubble in right eye for partial retinal detachment surgery     . I&D EXTREMITY Left 07/19/2016   Procedure: IRRIGATION AND DEBRIDEMENT LEFT LONG FINGER;  Surgeon: Betha LoaKevin Kuzma, MD;  Location: [facility];  Service: Orthopedics;  Laterality: Left;  . KNEE SURGERY Right    reports multiple surgery on knee  . LUMBAR DISC SURGERY    . PARATHYROIDECTOMY    . TEE WITHOUT CARDIOVERSION N/A 08/14/2016   Procedure: TRANSESOPHAGEAL ECHOCARDIOGRAM (TEE);  Surgeon: Chrystie NoseKenneth C Hilty, MD;   [facility];  Service: Cardiovascular;  Laterality: N/A;  . TOTAL HIP ARTHROPLASTY Right 10/02/2016   Procedure: RIGHT TOTAL HIP ARTHROPLASTY ANTERIOR APPROACH;  Surgeon: Ollen GrossAluisio, Frank, MD;  Location: [facility];  Service: Orthopedics;  Laterality: Right;     reports that she has never smoked. She has never used smokeless tobacco. She reports that she does not drink alcohol or use drugs. Social History   Social History  . Marital status: Widowed    Spouse name: N/A  . Number of children: 4  . Years of education: 11th   Occupational History  . retired/disabled d/t knee injury    Social History Main Topics  . Smoking status: Never Smoker  . Smokeless tobacco: Never Used  . Alcohol use No  . Drug use: No  . Sexual activity: Not on file   Other Topics Concern  . Not on file   Social History Narrative   Pt is widowed and retired. Lives alone. Has children.    Pertinent  Health Maintenance Due  Topic Date Due  . DEXA SCAN  01/12/2003  . PNA vac Low Risk Adult (2 of 2 - PCV13) 04/14/2014  . INFLUENZA VACCINE  12/11/2016    Medications: Allergies as of 10/23/2016      Reactions   Aricept [donepezil Hcl] Other (See Comments)   Nightmares   Sudafed [pseudoephedrine Hcl] Other (See Comments)   Hyperactive and agitated Hyperactive and agitated, congestion   Sugar-protein-starch Other (See Comments)   Loses voice, congestion   Ciprofloxacin Other (See Comments)   Stomach pains   Prednisone Other (See Comments)   Marked mentation/ mood change   Valium [diazepam] Other (See Comments)   Altered mental status changes-significant problems   Hydroxyzine Other (See Comments)   Hallucinations   Mupirocin Other (See Comments)   Flushing, severe coughing, gagging   Other    PT IS OF JEHOVAH WITNESS FAITH. NO BLOOD PRODUCTS.    Acyclovir Rash   Penicillins Itching, Rash   Has patient had a PCN reaction causing immediate rash, facial/tongue/throat swelling, SOB or  lightheadedness with hypotension: Yes Has patient had a PCN reaction causing severe rash involving mucus membranes or skin necrosis: No Has patient had a PCN reaction that required hospitalization No Has patient had a PCN reaction occurring within the last 10 years: No If all of the above answers are "NO", then may proceed with Cephalosporin use.      Medication List       Accurate as of 10/23/16  4:51 PM. Always use your most recent med list.          acetaminophen 500 MG tablet Commonly known as:  TYLENOL Take 1,000 mg by mouth 2 (two) times daily as needed for mild pain.   BIOTIN 5000 5 MG Caps Generic drug:  Biotin  Take 1 capsule by mouth daily.   carboxymethylcellulose 0.5 % Soln Commonly known as:  REFRESH PLUS Place 1 drop into both eyes 2 (two) times daily as needed (dry eyes).   clopidogrel 75 MG tablet Commonly known as:  PLAVIX Take 1 tablet (75 mg total) by mouth daily.   DULoxetine 30 MG capsule Commonly known as:  CYMBALTA Take 1 capsule by mouth daily.   levothyroxine 112 MCG tablet Commonly known as:  SYNTHROID, LEVOTHROID TAKE 1 TABLET (112 MCG TOTAL) BY MOUTH DAILY.   LORazepam 0.5 MG tablet Commonly known as:  ATIVAN Take 0.5 mg by mouth 3 (three) times daily.   memantine 10 MG tablet Commonly known as:  NAMENDA Take 10 mg by mouth 2 (two) times daily.   methocarbamol 500 MG tablet Commonly known as:  ROBAXIN Take 500 mg by mouth 3 (three) times daily.   methocarbamol 500 MG tablet Commonly known as:  ROBAXIN Take 1 tablet (500 mg total) by mouth every 6 (six) hours as needed for muscle spasms.   mirtazapine 30 MG tablet Commonly known as:  REMERON Take 30 mg by mouth at bedtime.   omega-3 acid ethyl esters 1 g capsule Commonly known as:  LOVAZA Take 1 capsule by mouth daily.   oxybutynin 5 MG 24 hr tablet Commonly known as:  DITROPAN-XL Take 1 tablet by mouth daily.   oxyCODONE 5 MG immediate release tablet Commonly known as:  Oxy  IR/ROXICODONE Take 5-10 mg by mouth every 6 (six) hours as needed for moderate pain or severe pain. Take 5 mg for moderate pain and 10 mg for severe pain   pravastatin 40 MG tablet Commonly known as:  PRAVACHOL Take 1 tablet (40 mg total) by mouth daily.   QUEtiapine 25 MG tablet Commonly known as:  SEROQUEL Take 25 mg by mouth at bedtime.   ranitidine 300 MG tablet Commonly known as:  ZANTAC Take 300 mg by mouth at bedtime.   sucralfate 1 g tablet Commonly known as:  CARAFATE TAKE 1 TABLET (1 GRAM) BY MOUTH tWICE DAILY        Vitals:   10/23/16 1107  BP: 125/63  Pulse: 88  Resp: 16  Temp: 97.9 F (36.6 C)  SpO2: 94%  Weight: 200 lb (90.7 kg)  Height: 4\' 9"  (1.448 m)   Body mass index is 43.28 kg/m.  Physical Exam  GENERAL APPEARANCE: Alert, conversant. No acute distress.  HEENT: Unremarkable. RESPIRATORY: Breathing is even, unlabored. Lung sounds are clear   CARDIOVASCULAR: Heart RRR no murmurs, rubs or gallops. No peripheral edema.  GASTROINTESTINAL: Abdomen is soft, non-tender, not distended w/ normal bowel sounds.  NEUROLOGIC: Cranial nerves 2-12 grossly intact. Moves all extremities   Labs reviewed: Basic Metabolic Panel:  Recent Labs  95/62/13 0038  09/24/16 1120  10/03/16 0422 10/04/16 10/04/16 0423 10/09/16  NA 137  < > 138  < > 137 136* 136 142  K 3.6  < > 4.1  < > 4.2  --  3.9 3.6  CL 106  < > 103  --  105  --  103  --   CO2 24  < > 25  --  26  --  26  --   GLUCOSE 156*  < > 129*  --  142*  --  154*  --   BUN 19  < > 15  < > 14 10 10 10   CREATININE 0.84  < > 0.87  < > 0.68 0.7 0.69 0.5  CALCIUM  8.6*  < > 9.3  --  8.2*  --  8.3*  --   MG 1.7  --   --   --   --   --   --   --   PHOS 3.2  --   --   --   --   --   --   --   < > = values in this interval not displayed. No results found for: [facility] Liver Function Tests:  Recent Labs  08/11/16 0351 08/14/16 08/14/16 0408 09/24/16 1120  AST 28 27 27 30   ALT 15 19 19 22   ALKPHOS 94 98  98 127*  BILITOT 0.9  --  0.5 0.4  PROT 6.2*  --  6.0* 7.0  ALBUMIN 3.8  --  3.4* 3.9    Recent Labs  02/28/16 0229 08/10/16 2127  LIPASE 23 21   No results for input(s): AMMONIA in the last 8760 hours. CBC:  Recent Labs  02/28/16 0229  07/19/16 1725  09/24/16 1120  10/03/16 0422 10/04/16 10/04/16 0423 10/09/16  WBC 11.6*  < > 10.6*  < > 7.8  < > 6.7 7.7 7.7 7.0  NEUTROABS 7.8*  --  6.9  --   --   --   --   --   --   --   HGB 14.6  < > 12.9  < > 13.0  < > 9.5*  --  8.6* 8.6*  HCT 44.0  < > 40.1  < > 41.1  < > 29.2*  --  26.7* 27*  MCV 86.1  < > 85.3  < > 86.5  --  86.6  --  85.6  --   PLT 262  < > 200  < > 239  < > 165  --  149* 256  < > = values in this interval not displayed. Lipid  Recent Labs  08/11/16 0350  CHOL 187  HDL 58  LDLCALC 90  TRIG 194*   Cardiac Enzymes: No results for input(s): CKTOTAL, CKMB, CKMBINDEX, TROPONINI in the last 8760 hours. BNP: No results for input(s): BNP in the last 8760 hours. CBG:  Recent Labs  07/22/16 1153 08/14/16 0828 10/02/16 0824  GLUCAP 150* 120* 133*    Procedures and Imaging Studies During Stay: Dg Pelvis Portable  Result Date: 10/02/2016 CLINICAL DATA:  Right hip replacement. EXAM: PORTABLE PELVIS 1-2 VIEWS COMPARISON:  PET-CT 12/22/2013.  CT 12/15/2013 . FINDINGS: Total right hip replacement.  Hardware intact.  Anatomic alignment. IMPRESSION: Total right hip replacement with anatomic alignment. Electronically Signed   By: Maisie Fus  Register   On: 10/02/2016 12:51   Dg C-arm 1-60 Min-no Report  Result Date: 10/02/2016 Fluoroscopy was utilized by the requesting physician.  No radiographic interpretation.    Assessment/Plan:   Primary osteoarthritis of right hip  History of total right hip arthroplasty  Acute blood loss as cause of postoperative anemia  OAB (overactive bladder)  Alzheimer's dementia with behavioral disturbance, unspecified timing of dementia onset  Depression, unspecified depression  type  Hyperlipidemia, unspecified hyperlipidemia type  Gastroesophageal reflux disease without esophagitis   Patient is being discharged with the following home health services:  OT/PT  Patient is being discharged with the following durable medical equipment:  none  Patient has been advised to f/u with their PCP in 1-2 weeks to bring them up to date on their rehab stay.  Social services at facility was responsible for arranging this appointment.  Pt was provided with a 30 day  supply of prescriptions for medications and refills must be obtained from their PCP.  For controlled substances, a more limited supply may be provided adequate until PCP appointment only.  Medications were reconciled.  Time spent > 30 min;> 50% of time with patient was spent reviewing records, labs, tests and studies, counseling and developing plan of care  Randon Goldsmith. Lyn Hollingshead, MD

## 2016-10-27 ENCOUNTER — Emergency Department (HOSPITAL_COMMUNITY): Payer: Medicare (Managed Care)

## 2016-10-27 ENCOUNTER — Emergency Department (HOSPITAL_COMMUNITY)
Admission: EM | Admit: 2016-10-27 | Discharge: 2016-10-31 | Disposition: A | Discharge status: Home / Self Care | Payer: Medicare (Managed Care) | Attending: Physician Assistant | Admitting: Physician Assistant

## 2016-10-27 ENCOUNTER — Encounter (HOSPITAL_COMMUNITY): Payer: Self-pay

## 2016-10-27 DIAGNOSIS — F0391 Unspecified dementia with behavioral disturbance: Secondary | ICD-10-CM | POA: Diagnosis not present

## 2016-10-27 DIAGNOSIS — F419 Anxiety disorder, unspecified: Secondary | ICD-10-CM | POA: Insufficient documentation

## 2016-10-27 DIAGNOSIS — Z79899 Other long term (current) drug therapy: Secondary | ICD-10-CM | POA: Insufficient documentation

## 2016-10-27 DIAGNOSIS — I10 Essential (primary) hypertension: Secondary | ICD-10-CM | POA: Insufficient documentation

## 2016-10-27 DIAGNOSIS — E039 Hypothyroidism, unspecified: Secondary | ICD-10-CM | POA: Diagnosis not present

## 2016-10-27 DIAGNOSIS — Z96641 Presence of right artificial hip joint: Secondary | ICD-10-CM | POA: Insufficient documentation

## 2016-10-27 DIAGNOSIS — F329 Major depressive disorder, single episode, unspecified: Secondary | ICD-10-CM | POA: Diagnosis present

## 2016-10-27 DIAGNOSIS — E785 Hyperlipidemia, unspecified: Secondary | ICD-10-CM | POA: Diagnosis not present

## 2016-10-27 DIAGNOSIS — N3281 Overactive bladder: Secondary | ICD-10-CM | POA: Diagnosis not present

## 2016-10-27 DIAGNOSIS — F32A Depression, unspecified: Secondary | ICD-10-CM

## 2016-10-27 LAB — RAPID URINE DRUG SCREEN, HOSP PERFORMED
AMPHETAMINES: NOT DETECTED
BENZODIAZEPINES: NOT DETECTED
Barbiturates: NOT DETECTED
COCAINE: NOT DETECTED
OPIATES: NOT DETECTED
TETRAHYDROCANNABINOL: NOT DETECTED

## 2016-10-27 LAB — URINALYSIS, ROUTINE W REFLEX MICROSCOPIC
Bilirubin Urine: NEGATIVE
Glucose, UA: NEGATIVE mg/dL
HGB URINE DIPSTICK: NEGATIVE
Ketones, ur: NEGATIVE mg/dL
NITRITE: NEGATIVE
PH: 9 — AB (ref 5.0–8.0)
Protein, ur: NEGATIVE mg/dL
SPECIFIC GRAVITY, URINE: 1.01 (ref 1.005–1.030)

## 2016-10-27 LAB — CBC WITH DIFFERENTIAL/PLATELET
BASOS PCT: 0 %
Basophils Absolute: 0 10*3/uL (ref 0.0–0.1)
EOS ABS: 0.1 10*3/uL (ref 0.0–0.7)
EOS PCT: 1 %
HCT: 35.2 % — ABNORMAL LOW (ref 36.0–46.0)
HEMOGLOBIN: 10.9 g/dL — AB (ref 12.0–15.0)
LYMPHS ABS: 1.7 10*3/uL (ref 0.7–4.0)
Lymphocytes Relative: 18 %
MCH: 25.3 pg — AB (ref 26.0–34.0)
MCHC: 31 g/dL (ref 30.0–36.0)
MCV: 81.7 fL (ref 78.0–100.0)
MONO ABS: 0.7 10*3/uL (ref 0.1–1.0)
MONOS PCT: 8 %
Neutro Abs: 6.6 10*3/uL (ref 1.7–7.7)
Neutrophils Relative %: 73 %
PLATELETS: 349 10*3/uL (ref 150–400)
RBC: 4.31 MIL/uL (ref 3.87–5.11)
RDW: 15.7 % — AB (ref 11.5–15.5)
WBC: 9.1 10*3/uL (ref 4.0–10.5)

## 2016-10-27 LAB — COMPREHENSIVE METABOLIC PANEL
ALT: 13 U/L — ABNORMAL LOW (ref 14–54)
AST: 31 U/L (ref 15–41)
Albumin: 3.5 g/dL (ref 3.5–5.0)
Alkaline Phosphatase: 165 U/L — ABNORMAL HIGH (ref 38–126)
Anion gap: 15 (ref 5–15)
BUN: 13 mg/dL (ref 6–20)
CHLORIDE: 105 mmol/L (ref 101–111)
CO2: 19 mmol/L — AB (ref 22–32)
Calcium: 9.7 mg/dL (ref 8.9–10.3)
Creatinine, Ser: 1.05 mg/dL — ABNORMAL HIGH (ref 0.44–1.00)
GFR, EST AFRICAN AMERICAN: 57 mL/min — AB (ref >60)
GFR, EST NON AFRICAN AMERICAN: 50 mL/min — AB (ref >60)
Glucose, Bld: 157 mg/dL — ABNORMAL HIGH (ref 65–99)
POTASSIUM: 4 mmol/L (ref 3.5–5.1)
SODIUM: 139 mmol/L (ref 135–145)
Total Bilirubin: 0.5 mg/dL (ref 0.3–1.2)
Total Protein: 6.7 g/dL (ref 6.5–8.1)

## 2016-10-27 LAB — ETHANOL

## 2016-10-27 MED ORDER — MEMANTINE HCL 10 MG PO TABS
10.0000 mg | ORAL_TABLET | Freq: Two times a day (BID) | ORAL | Status: DC
  Administered 2016-10-27 – 2016-10-31 (×8): 10 mg via ORAL
  Filled 2016-10-27 (×8): qty 1

## 2016-10-27 MED ORDER — LORAZEPAM 2 MG/ML IJ SOLN
1.0000 mg | Freq: Once | INTRAMUSCULAR | Status: AC
  Administered 2016-10-27: 1 mg via INTRAVENOUS
  Filled 2016-10-27: qty 1

## 2016-10-27 MED ORDER — LORAZEPAM 0.5 MG PO TABS
0.5000 mg | ORAL_TABLET | Freq: Three times a day (TID) | ORAL | Status: DC
  Administered 2016-10-27 – 2016-10-31 (×12): 0.5 mg via ORAL
  Filled 2016-10-27 (×12): qty 1

## 2016-10-27 MED ORDER — CLOPIDOGREL BISULFATE 75 MG PO TABS
75.0000 mg | ORAL_TABLET | Freq: Every day | ORAL | Status: DC
  Administered 2016-10-27 – 2016-10-31 (×5): 75 mg via ORAL
  Filled 2016-10-27 (×6): qty 1

## 2016-10-27 MED ORDER — OXYCODONE-ACETAMINOPHEN 5-325 MG PO TABS
1.0000 | ORAL_TABLET | Freq: Once | ORAL | Status: AC
  Administered 2016-10-27: 1 via ORAL
  Filled 2016-10-27: qty 1

## 2016-10-27 MED ORDER — QUETIAPINE FUMARATE 25 MG PO TABS
25.0000 mg | ORAL_TABLET | Freq: Every day | ORAL | Status: DC
  Administered 2016-10-27 – 2016-10-30 (×4): 25 mg via ORAL
  Filled 2016-10-27 (×4): qty 1

## 2016-10-27 MED ORDER — ACETAMINOPHEN 500 MG PO TABS
1000.0000 mg | ORAL_TABLET | Freq: Two times a day (BID) | ORAL | Status: DC | PRN
  Administered 2016-10-29: 1000 mg via ORAL
  Filled 2016-10-27: qty 2

## 2016-10-27 NOTE — ED Notes (Signed)
PT assisted in using bedpan by this nurse and Darrel, ED tech

## 2016-10-27 NOTE — ED Notes (Signed)
Patient transported to CT 

## 2016-10-27 NOTE — ED Notes (Signed)
[facility]

## 2016-10-27 NOTE — ED Notes (Signed)
Telepsych at bedside with PT and children

## 2016-10-27 NOTE — ED Triage Notes (Signed)
Patient here with son whom she lives with. Patient states that she is not taking her meds for her depression. Patient alert and oriented, states that she became more emotional yesterday and has had increased crying and restlessness. Denies suicidal/homicidal thoughts. Hyperventilating on arrival

## 2016-10-27 NOTE — Progress Notes (Signed)
CSW reviewed pt's chart.  Patient was assessed by TTS and pt meets inpatient criteria per, Julian Hy. Okonkwo, NP.   CSW faxed ref[facility] out [facility] the following:  [facility], [facility], [facility], [facility], [facility], [facility], [facility], [place]   Gerlene Glassburn T. Kaylyn LimSutter, MSW, LCSWA Clinical Social Work Disposition [phone]

## 2016-10-27 NOTE — BH Assessment (Addendum)
Assessment Note  Jody Porter is an 81 y.o. female who presents to [facility] accompanied by her son. Her son brought her to the ED after a severe panic attack. Pt reports crying and yelling "no, no, no" uncontrollably. She is unable to identify triggers for panic attack. Pt demonstrated difficulties expressing herself through speech. She states she cannot find the words and sometimes says random words or statements that are not understandable. Pt endorses AVH. She reports seeing a person on her right and hearing voices but cannot understand what they are saying. Pt states "it was going to kill me." She states someone was going to kill her with rope or thread. She also reports her leg was making noises but was unable to elaborate. Son and daughter were present during assessment with pt's permission. Her children state she started experiencing speech difficulties and severe panic attacks in March, 2018. She was hospitalized at [facility]. Also, she reports receiving a hip replacement recently. They report witnessing panic attacks a few times per week but this is the second severe panic attack. "We can't get her to stop crying." After discharge from [facility], she was referred to Dr. Donell Beers but only seen him once. She is receiving her medications from her primary care physician. Son reports pt has not taken her medications in a few days and is taking a lot of Benadryl lately. Son organizes pt's medications in a weekly pill organizer and pt takes them on her own. Pt lives with son and can return home upon discharge. Pt denies SI, HI and substance abuse.   Per Leighton Ruff, NP pt meets criteria for inpatient admission. TTS will seek placement.   Diagnosis: Psychosis  Past Medical History:  Past Medical History:  Diagnosis Date  . Allergic rhinitis   . Anxiety    hospitalized 03/2016 per daughter   . Degenerative arthritis   . Depression   . Dyspnea    with exertion   . Finger infection     07/2016 for mrsa per daughter but has cleared   . GERD (gastroesophageal reflux disease)   . Hyperparathyroidism   . Hypertension   . Hypothyroid   . Insomnia    w/ sleep apnea NPSG 07/13/2009 AHI 3.5, RDI 29.9/hr  . LBP (low back pain)   . Macular edema    OD  . Memory difficulties 01/27/2014  . Obesity   . Pre-diabetes   . Prediabetes   . Sleep apnea   . Urinary incontinence     Past Surgical History:  Procedure Laterality Date  . carpel tunnel Bilateral   . cataracts    . CESAREAN SECTION    . CHOLECYSTECTOMY    . eyelid surgery     . gas bubble in right eye for partial retinal detachment surgery     . I&D EXTREMITY Left 07/19/2016   Procedure: IRRIGATION AND DEBRIDEMENT LEFT LONG FINGER;  Surgeon: Betha Loa, MD;  Location: [facility];  Service: Orthopedics;  Laterality: Left;  . KNEE SURGERY Right    reports multiple surgery on knee  . LUMBAR DISC SURGERY    . PARATHYROIDECTOMY    . TEE WITHOUT CARDIOVERSION N/A 08/14/2016   Procedure: TRANSESOPHAGEAL ECHOCARDIOGRAM (TEE);  Surgeon: Chrystie Nose, MD;  Location: [facility] ENDOSCOPY;  Service: Cardiovascular;  Laterality: N/A;  . TOTAL HIP ARTHROPLASTY Right 10/02/2016   Procedure: RIGHT TOTAL HIP ARTHROPLASTY ANTERIOR APPROACH;  Surgeon: [name], MD;  Location: [facility];  Service: Orthopedics;  Laterality: Right;  Family History:  Family History  Problem Relation Age of Onset  . Cancer Mother   . Seizures Mother        grand mal  . Cancer Father        prostate  . Arthritis/Rheumatoid Sister   . Cancer Paternal Grandmother   . Cancer Maternal Grandmother     Social History:  reports that she has never smoked. She has never used smokeless tobacco. She reports that she does not drink alcohol or use drugs.  Additional Social History:  Alcohol / Drug Use Pain Medications: See MAR  Prescriptions: See Mar  Over the Counter: See MAR  History of alcohol / drug use?: No history of alcohol / drug abuse  CIWA:  CIWA-Ar BP: 127/75 Pulse Rate: (!) 104 COWS:    Allergies:  Allergies  Allergen Reactions  . Aricept [Donepezil Hcl] Other (See Comments)    Nightmares  . Sudafed [Pseudoephedrine Hcl] Other (See Comments)    Hyperactive and agitated Hyperactive and agitated, congestion   . Sugar-Protein-Starch Other (See Comments)    Loses voice, congestion  . Ciprofloxacin Other (See Comments)    Stomach pains  . Prednisone Other (See Comments)    Marked mentation/ mood change  . Valium [Diazepam] Other (See Comments)    Altered mental status changes-significant problems  . Hydroxyzine Other (See Comments)    Hallucinations   . Mupirocin Other (See Comments)    Flushing, severe coughing, gagging  . Other     PT IS OF JEHOVAH WITNESS FAITH. NO BLOOD PRODUCTS.   Marland Kitchen Acyclovir Rash  . Penicillins Itching and Rash    Has patient had a PCN reaction causing immediate rash, facial/tongue/throat swelling, SOB or lightheadedness with hypotension: Yes Has patient had a PCN reaction causing severe rash involving mucus membranes or skin necrosis: No Has patient had a PCN reaction that required hospitalization No Has patient had a PCN reaction occurring within the last 10 years: No If all of the above answers are "NO", then may proceed with Cephalosporin use.     Home Medications:  (Not in a hospital admission)  OB/GYN Status:  No LMP recorded. Patient is postmenopausal.  General Assessment Data Location of Assessment: [facility] ED TTS Assessment: In system Is this a Tele or Face-to-Face Assessment?: Tele Assessment Is this an Initial Assessment or a Re-assessment for this encounter?: Initial Assessment Marital status: Single Is patient pregnant?: No Pregnancy Status: No Living Arrangements: Children Can pt return to current living arrangement?: Yes Admission Status: Voluntary Is patient capable of signing voluntary admission?: Yes Referral Source: Self/Family/Friend Insurance type:  Medicare  Medical Screening Exam [facility] Walk-in ONLY) Medical Exam completed: Yes  Crisis Care Plan Living Arrangements: Children Name of Psychiatrist: Dr. Donell Beers  Name of Therapist: None   Education Status Is patient currently in school?: No  Risk to self with the past 6 months Suicidal Ideation: No Has patient been a risk to self within the past 6 months prior to admission? : No Suicidal Intent: No Has patient had any suicidal intent within the past 6 months prior to admission? : No Is patient at risk for suicide?: No Suicidal Plan?: No Has patient had any suicidal plan within the past 6 months prior to admission? : No Access to Means: No Previous Attempts/Gestures: No Intentional Self Injurious Behavior: None Family Suicide History: No Recent stressful life event(s): Recent negative physical changes Persecutory voices/beliefs?: Yes Depression: Yes Depression Symptoms: Tearfulness, Feeling angry/irritable, Loss of interest in usual pleasures Substance abuse  history and/or treatment for substance abuse?: No Suicide prevention information given to non-admitted patients: Not applicable  Risk to Others within the past 6 months Homicidal Ideation: No Does patient have any lifetime risk of violence toward others beyond the six months prior to admission? : No Thoughts of Harm to Others: No Current Homicidal Intent: No Current Homicidal Plan: No Access to Homicidal Means: No History of harm to others?: No Assessment of Violence: None Noted Does patient have access to weapons?: No Criminal Charges Pending?: No Does patient have a court date: No Is patient on probation?: No  Psychosis Hallucinations: Auditory, Visual Delusions: Persecutory  Mental Status Report Appearance/Hygiene: Disheveled Eye Contact: Poor Motor Activity: Psychomotor retardation Speech: Word salad Level of Consciousness: Alert Mood: Depressed Affect: Depressed Anxiety Level: Panic Attacks Most  recent panic attack: Today  Thought Processes: Tangential Judgement: Impaired Orientation: Person, Place Obsessive Compulsive Thoughts/Behaviors: None  Cognitive Functioning Concentration: Decreased Memory: Recent Impaired IQ: Average Insight: Poor Impulse Control: Fair Appetite: Poor Weight Loss:  (Unknown ) Weight Gain: 0 Sleep: Increased Total Hours of Sleep: 7 Vegetative Symptoms: Decreased grooming  ADLScreening [facility] Assessment Services) Patient's cognitive ability adequate to safely complete daily activities?: Yes Patient able to express need for assistance with ADLs?: Yes Independently performs ADLs?: Yes (appropriate for developmental age)  Prior Inpatient Therapy Prior Inpatient Therapy: Yes Prior Therapy Dates: 07/2016 Prior Therapy Facilty/Provider(s): [place]  Reason for Treatment: Psychosis   Prior Outpatient Therapy Prior Outpatient Therapy: No Does patient have an ACCT team?: No Does patient have Intensive In-House Services?  : No Does patient have [facility] services? : No Does patient have P4CC services?: No  ADL Screening (condition at time of admission) Patient's cognitive ability adequate to safely complete daily activities?: Yes Is the patient deaf or have difficulty hearing?: No Does the patient have difficulty seeing, even when wearing glasses/contacts?: No Does the patient have difficulty concentrating, remembering, or making decisions?: Yes Patient able to express need for assistance with ADLs?: Yes Does the patient have difficulty dressing or bathing?: No Independently performs ADLs?: Yes (appropriate for developmental age) Does the patient have difficulty walking or climbing stairs?: Yes Weakness of Legs: None Weakness of Arms/Hands: None  Home Assistive Devices/Equipment Home Assistive Devices/Equipment: None  Therapy Consults (therapy consults require a physician order) PT Evaluation Needed: No OT Evalulation Needed: No SLP  Evaluation Needed: No Abuse/Neglect Assessment (Assessment to be complete while patient is alone) Physical Abuse: Denies Verbal Abuse: Denies Sexual Abuse: Denies Exploitation of patient/patient's resources: Denies Self-Neglect: Denies Values / Beliefs Cultural Requests During Hospitalization: None Spiritual Requests During Hospitalization: None Consults Spiritual Care Consult Needed: No Social Work Consult Needed: No Merchant navy officer (For Healthcare) Does Patient Have a Medical Advance Directive?: Yes Does patient want to make changes to medical advance directive?: No - Patient declined Type of Advance Directive: Out of facility DNR (pink MOST or yellow form) Copy of Healthcare Power of Attorney in Chart?: No - copy requested Nutrition Screen- [facility] Patient's home diet: Regular Has the patient recently lost weight without trying?: No Has the patient been eating poorly because of a decreased appetite?: Yes Malnutrition Screening Tool Score: 1  Additional Information 1:1 In Past 12 Months?: No CIRT Risk: No Elopement Risk: No Does patient have medical clearance?: Yes     Disposition:  Disposition Initial Assessment Completed for this Encounter: Yes Disposition of Patient: Inpatient treatment program Type of inpatient treatment program: Adult  On Site Evaluation by:   Reviewed with Physician:  Daisy FloroCandace L Tersea Aulds MSW, LCSWA  10/27/2016 2:27 PM

## 2016-10-27 NOTE — ED Provider Notes (Signed)
[facility]-EM[facility] DEPT Provider Note   CSN: [id number] Arrival date & time: 10/27/16  1011     History   Chief Complaint Chief Complaint  Patient presents with  . Depression    HPI Jody Porter is a 81 y.o. female who presents with progressively worsening anxiety. Patient's son states that patient recently came home from the hospital on 10/24/16 after an admission for hospice hip replacement. Patient had initially been doing well and is acting appropriately. He reports that yesterday she started getting very anxious but would not disclose about what. Son states that this morning when they woke up, and patient was very tearful and screaming, prompting emergency department visit. Patient states that she has been taking her medications appropriately but son states that her pillbox had the pills from yesterday and this morning still in it. Son states that patient has started taking Benadryl after getting home from the hospital and doesn't know she is taking too much of it. Patient states she is "scared, worried "but doesn't know what about. She denies any SI/HI. But she states that she is hearing voices and in seeing hallucinations" her favorite thing, little children laughing." Patient is unclear how long this has been going on. Patient is currently on Seroquel, Remeron, Cymbalta. Patient denies any chest pain, shortness of breath, dysuria, hematuria, abdominal pain, nausea/vomiting, fevers.   The history is provided by the patient.    Past Medical History:  Diagnosis Date  . Allergic rhinitis   . Anxiety    hospitalized 03/2016 per daughter   . Degenerative arthritis   . Depression   . Dyspnea    with exertion   . Finger infection    07/2016 for mrsa per daughter but has cleared   . GERD (gastroesophageal reflux disease)   . Hyperparathyroidism   . Hypertension   . Hypothyroid   . Insomnia    w/ sleep apnea NPSG 07/13/2009 AHI 3.5, RDI 29.9/hr  . LBP (low back pain)   . Macular  edema    OD  . Memory difficulties 01/27/2014  . Obesity   . Pre-diabetes   . Prediabetes   . Sleep apnea   . Urinary incontinence     Patient Active Problem List   Diagnosis Date Noted  . History of total right hip arthroplasty 10/12/2016  . Acute blood loss as cause of postoperative anemia 10/12/2016  . Dementia with behavioral disturbance 10/12/2016  . OAB (overactive bladder) 10/12/2016  . HLD (hyperlipidemia) 10/12/2016  . OA (osteoarthritis) of hip 10/02/2016  . Anxiety   . Abdominal discomfort   . Hypercalcemia   . Word finding difficulty   . TIA (transient ischemic attack) 08/11/2016  . MRSA (methicillin resistant Staphylococcus aureus) infection 07/22/2016  . Infection of finger 07/21/2016  . Cellulitis of hand 07/19/2016  . Prediabetes 07/19/2016  . MDD (major depressive disorder), recurrent episode, severe (HCC) 04/01/2016  . Memory difficulties 01/27/2014  . Solitary pulmonary nodule 01/19/2014  . Other specified hypothyroidism 11/23/2013  . Depression 11/23/2013  . Essential hypertension, benign 11/23/2013  . Low urine output 11/23/2013  . Other malaise and fatigue 11/23/2013  . Cough 05/12/2013  . Severe obesity (BMI >= 40) (HCC) 01/08/2013  . ALLERGIC RHINITIS 08/21/2009  . INSOMNIA WITH SLEEP APNEA UNSPECIFIED 08/18/2009  . Hypothyroidism 02/24/2007  . DEPRESSION 02/24/2007  . Essential hypertension 02/24/2007  . GERD 02/24/2007  . LOW BACK PAIN 02/24/2007  . HYPERPARATHYROIDISM, HX OF 02/24/2007    Past Surgical History:  Procedure Laterality Date  .  carpel tunnel Bilateral   . cataracts    . CESAREAN SECTION    . CHOLECYSTECTOMY    . eyelid surgery     . gas bubble in right eye for partial retinal detachment surgery     . I&D EXTREMITY Left 07/19/2016   Procedure: IRRIGATION AND DEBRIDEMENT LEFT LONG FINGER;  Surgeon: Betha Loa, MD;  Location: [facility];  Service: Orthopedics;  Laterality: Left;  . KNEE SURGERY Right    reports multiple surgery  on knee  . LUMBAR DISC SURGERY    . PARATHYROIDECTOMY    . TEE WITHOUT CARDIOVERSION N/A 08/14/2016   Procedure: TRANSESOPHAGEAL ECHOCARDIOGRAM (TEE);  Surgeon: Chrystie Nose, MD;  Location: [facility] ENDOSCOPY;  Service: Cardiovascular;  Laterality: N/A;  . TOTAL HIP ARTHROPLASTY Right 10/02/2016   Procedure: RIGHT TOTAL HIP ARTHROPLASTY ANTERIOR APPROACH;  Surgeon: [name], MD;  Location: [facility] ORS;  Service: Orthopedics;  Laterality: Right;    OB History    No data available       Home Medications    Prior to Admission medications   Medication Sig Start Date End Date Taking? Authorizing Provider  acetaminophen (TYLENOL) 500 MG tablet Take 1,000 mg by mouth 2 (two) times daily as needed for mild pain.   Yes [provider]  Biotin (BIOTIN 5000) 5 MG CAPS Take 1 capsule by mouth daily.   Yes [provider]  carboxymethylcellulose (REFRESH PLUS) 0.5 % SOLN Place 1 drop into both eyes 2 (two) times daily as needed (dry eyes).   Yes [provider]  clopidogrel (PLAVIX) 75 MG tablet Take 1 tablet (75 mg total) by mouth daily. 08/13/16  Yes Richarda Overlie, MD  DULoxetine (CYMBALTA) 30 MG capsule Take 1 capsule by mouth daily.  06/20/16  Yes [provider]  levothyroxine (SYNTHROID, LEVOTHROID) 112 MCG tablet TAKE 1 TABLET (112 MCG TOTAL) BY MOUTH DAILY. 09/19/14  Yes Sharon Seller, NP  memantine (NAMENDA) 10 MG tablet Take 10 mg by mouth 2 (two) times daily.  06/02/16  Yes [provider]  methocarbamol (ROBAXIN) 500 MG tablet Take 1 tablet (500 mg total) by mouth every 6 (six) hours as needed for muscle spasms. 10/04/16  Yes Aluisio, Homero Fellers, MD  methocarbamol (ROBAXIN) 500 MG tablet Take 500 mg by mouth 3 (three) times daily.   Yes [provider]  mirtazapine (REMERON) 30 MG tablet Take 30 mg by mouth at bedtime.    Yes [provider]  omega-3 acid ethyl esters (LOVAZA) 1 g capsule Take 1 capsule by mouth daily.    Yes [provider]  oxybutynin (DITROPAN-XL) 5 MG 24 hr tablet Take 1 tablet by mouth daily. 06/23/16  Yes [provider]  oxyCODONE (OXY IR/ROXICODONE) 5 MG immediate release tablet Take 5-10 mg by mouth every 6 (six) hours as needed for moderate pain or severe pain. Take 5 mg for moderate pain and 10 mg for severe pain   Yes [provider]  QUEtiapine (SEROQUEL) 25 MG tablet Take 25 mg by mouth at bedtime.   Yes [provider]  ranitidine (ZANTAC) 300 MG tablet Take 300 mg by mouth at bedtime.    Yes [provider]  sucralfate (CARAFATE) 1 g tablet TAKE 1 TABLET (1 GRAM) BY MOUTH tWICE DAILY 09/17/16  Yes [provider]  LORazepam (ATIVAN) 0.5 MG tablet Take 0.5 mg by mouth 3 (three) times daily.  02/29/16   [provider]  pravastatin (PRAVACHOL) 40 MG tablet Take 1 tablet (  40 mg total) by mouth daily. Patient not taking: Reported on 10/27/2016 08/13/16   Richarda Overlie, MD    Family History Family History  Problem Relation Age of Onset  . Cancer Mother   . Seizures Mother        grand mal  . Cancer Father        prostate  . Arthritis/Rheumatoid Sister   . Cancer Paternal Grandmother   . Cancer Maternal Grandmother     Social History Social History  Substance Use Topics  . Smoking status: Never Smoker  . Smokeless tobacco: Never Used  . Alcohol use No     Allergies   Aricept [donepezil hcl]; Sudafed [pseudoephedrine hcl]; Sugar-protein-starch; Ciprofloxacin; Prednisone; Valium [diazepam]; Hydroxyzine; Mupirocin; Other; Acyclovir; and Penicillins   Review of Systems Review of Systems  Constitutional: Negative for fever.  Respiratory: Negative for cough and shortness of breath.   Cardiovascular: Negative for chest pain.  Gastrointestinal: Negative for abdominal pain, nausea and vomiting.  Genitourinary: Negative for dysuria and hematuria.  Skin: Negative for rash.  Neurological: Negative for weakness and numbness.    Psychiatric/Behavioral: Positive for hallucinations. Negative for suicidal ideas. The patient is nervous/anxious.      Physical Exam Updated Vital Signs BP (!) 132/91   Pulse (!) 109   Temp 99.1 F (37.3 C) (Oral)   Resp (!) 21   SpO2 98%   Physical Exam  Constitutional: She is oriented to person, place, and time. She appears well-developed and well-nourished.  Very anxious and intermittently tearful throughout exam.   HENT:  Head: Normocephalic and atraumatic.  Mouth/Throat: Oropharynx is clear and moist and mucous membranes are normal.  Eyes: Conjunctivae, EOM and lids are normal. Pupils are equal, round, and reactive to light.  Neck: Full passive range of motion without pain.  Full flexion/extension and lateral movement of neck fully intact. No bony midline tenderness. No deformities or crepitus.   Cardiovascular: Regular rhythm, normal heart sounds and normal pulses.  Tachycardia present.  Exam reveals no gallop and no friction rub.   No murmur heard. Pulses:      Radial pulses are 2+ on the right side, and 2+ on the left side.       Dorsalis pedis pulses are 2+ on the right side, and 2+ on the left side.  Pulmonary/Chest: Effort normal and breath sounds normal.  Abdominal: Soft. Normal appearance. There is no tenderness. There is no rigidity and no guarding.  Musculoskeletal: Normal range of motion.  Neurological: She is alert and oriented to person, place, and time. GCS eye subscore is 4. GCS verbal subscore is 5. GCS motor subscore is 6.  Follows commands, Moves all extremities  5/5 strength to BUE and BLE  Sensation intact throughout   Skin: Skin is warm and dry. Capillary refill takes less than 2 seconds.  Well-healed surgical incision to the right hip. No surrounding erythema, warmth or active drainage. Multiple healed surgical scars to the bilateral knees.  Psychiatric: Her speech is normal. Her mood appears anxious. Thought content is paranoid. She expresses no  homicidal and no suicidal ideation.  Will intermittently interject saying "no, no, no, no." Patient yelling "I'm so scared," when prompted she does not know what she is scared. Patient will often wander on tangents and start discussing something that is not related to current conversation but she is redirectable.  Nursing note and vitals reviewed.    ED Treatments / Results  Labs (all labs ordered are listed, but only abnormal results  are displayed) Labs Reviewed  COMPREHENSIVE METABOLIC PANEL - Abnormal; Notable for the following:       Result Value   CO2 19 (*)    Glucose, Bld 157 (*)    Creatinine, Ser 1.05 (*)    ALT 13 (*)    Alkaline Phosphatase 165 (*)    GFR calc non Af Amer 50 (*)    GFR calc Af Amer 57 (*)    All other components within normal limits  CBC WITH DIFFERENTIAL/PLATELET - Abnormal; Notable for the following:    Hemoglobin 10.9 (*)    HCT 35.2 (*)    MCH 25.3 (*)    RDW 15.7 (*)    All other components within normal limits  URINALYSIS, ROUTINE W REFLEX MICROSCOPIC - Abnormal; Notable for the following:    APPearance HAZY (*)    pH 9.0 (*)    Leukocytes, UA SMALL (*)    Bacteria, UA RARE (*)    Squamous Epithelial / LPF 0-5 (*)    All other components within normal limits  ETHANOL  RAPID URINE DRUG SCREEN, HOSP PERFORMED    EKG  EKG Interpretation None       Radiology Ct Head Wo Contrast  Result Date: 10/27/2016 CLINICAL DATA:  81 y/o  F; altered mental status. EXAM: CT HEAD WITHOUT CONTRAST TECHNIQUE: Contiguous axial images were obtained from the base of the skull through the vertex without intravenous contrast. COMPARISON:  08/11/2016 MRI head. 08/10/2016 CT head. 10/17/2015 CT head. FINDINGS: Brain: No evidence of acute infarction, hemorrhage, hydrocephalus, extra-axial collection or mass lesion/mass effect. Stable mild chronic microvascular ischemic changes and parenchymal volume loss of the brain for age. Stable lucency in the left lentiform  nucleus which may represent a prominent perivascular space or old lacunar infarct. Stable 6 mm calcification overlying the left cavernous sinus is stable (series 5, image 29), possibly a small meningioma or dural plaque. Vascular: No hyperdense vessel or unexpected calcification. Skull: Stable left frontal bone sclerotic focus without bony expansion or destructive changes probably representing bone island. No displaced calvarial fracture. Sinuses/Orbits: No acute finding. Other: Bilateral intra-ocular lens replacement. IMPRESSION: 1. No acute intracranial abnormality identified. 2. Stable mild chronic microvascular ischemic changes and mild parenchymal volume loss of the brain. Electronically Signed   By: Mitzi Hansen M.D.   On: 10/27/2016 16:39    Procedures Procedures (including critical care time)  Medications Ordered in ED Medications  acetaminophen (TYLENOL) tablet 1,000 mg (not administered)  clopidogrel (PLAVIX) tablet 75 mg (75 mg Oral Given 10/27/16 1700)  LORazepam (ATIVAN) tablet 0.5 mg (0.5 mg Oral Given 10/27/16 1701)  memantine (NAMENDA) tablet 10 mg (not administered)  QUEtiapine (SEROQUEL) tablet 25 mg (not administered)  LORazepam (ATIVAN) injection 1 mg (1 mg Intravenous Given 10/27/16 1337)  oxyCODONE-acetaminophen (PERCOCET/ROXICET) 5-325 MG per tablet 1 tablet (1 tablet Oral Given 10/27/16 1701)  LORazepam (ATIVAN) injection 1 mg (1 mg Intravenous Given 10/27/16 1853)     Initial Impression / Assessment and Plan / ED Course  I have reviewed the triage vital signs and the nursing notes.  Pertinent labs & imaging results that were available during my care of the patient were reviewed by me and considered in my medical decision making (see chart for details).     81 year old female who presents with increasing anxiety, worsening depressive thoughts, abnormal behavior. Brought in by son with concerns of her behavior. Son and she has not been taking her medication  properly. Son is also concerned the patient  has been taking some Benadryl. No history of falls, traumas. Patient states that she is so worried but is unable to tell me about what. She just says that she is very anxious. No SI no HI. She is having auditory and visual hallucinations. Offered TTS services to patient. Patient is agreeable to talk to them. Will run medical clearance labs including, UA, UDS, CBC, CMP, EKG. Given abnormal behavior, will also check CT head for further evaluation. Ativan given in the department help patient with anxiety.  Labs reviewed. Urine shows small leukocytes. CBC appears consistent with prior. Slight bump in creatinine on CMP but otherwise unremarkable. UDS is negative. CT head is pending.  TTS is recommending inpatient evaluation for patient. Given current symptoms, feel this is reasonable.  CT head is negative for any acute infarct or abnormality. Discussed results with patient and family. Patient is agreeable to staying overnight. Patient is complaining of some moderate right hip pain from movement during imaging. Will plan to give her analgesics in the department. Patient placed in psych hold.   Final Clinical Impressions(s) / ED Diagnoses   Final diagnoses:  Anxiety  Depression, unspecified depression type    New Prescriptions New Prescriptions   No medications on file     Rosana HoesLayden, Jazalyn Mondor A, PA-C 10/27/16 2103    Abelino DerrickMackuen, Courteney Lyn, MD 10/29/16 2330

## 2016-10-27 NOTE — ED Notes (Signed)
PT assisted to restroom by this nurse. PT is able to have a BM. PT assisted back to bedside. PT prefers to sit up in a chair at bedside. PT's family at bedside. PT and family aware that PT is not to transfer without nursing assistance. They agree to alert nursing staff if PT would like to transfer. PT sitting up eating food provided by family

## 2016-10-27 NOTE — ED Notes (Signed)
Returns from CT. PT complaining of right hip pain post moving to CT scanner and back. EDP made aware

## 2016-10-27 NOTE — ED Notes (Signed)
PT confirms that she does not have thoughts of harming herself or anyone else.

## 2016-10-28 ENCOUNTER — Emergency Department (HOSPITAL_COMMUNITY): Payer: Medicare (Managed Care)

## 2016-10-28 NOTE — ED Notes (Signed)
Grilled Ham and [employer]Cheese Sandwich, w/ Chicken Noddle Soup, and Vanilla Ice Cream, w/ Bottle Water and Coffee for [employer]unch.

## 2016-10-28 NOTE — Progress Notes (Signed)
Patient's information was re-faxed to [facility] for review.    Jody ShanksLauren Stephinie Battisti, LCSW Clinical Social Work [phone]

## 2016-10-28 NOTE — [facility] Counselor (Signed)
TTS reassessment:  Pt was tearful and anxious during assessment. She appears to be confused but is oriented to self. She states that she feels "empty, forgotten, and can not enjoy life." She states that she "needs help with something but doesn't know what is missing". Pt states that she has been inpatient in the past. She currently lives with her son who is supportive. Pt states that her son is "bipolar and is supportive some days and not others." Pt denies SI, HI or AVH but still needs inpatient treatment. TTS is seeking inpatient placement and has faxed her referrals to several geriatric facilities.   [address]Corrion Stirewalt Pine Lakes AdditionLPC, LCAS

## 2016-10-28 NOTE — ED Notes (Signed)
Took patient to the bathroom patient is back in bed resting

## 2016-10-28 NOTE — ED Notes (Signed)
Confirmation received that fax was sent successfully.

## 2016-10-28 NOTE — ED Triage Notes (Signed)
PT sitting on side of bed ,tearful asking for ativan.  Pt upset when pill was given instead of IV ativan.

## 2016-10-28 NOTE — ED Notes (Signed)
Cheryl from [facility] called back requesting Urinalysis and urine drug screen results.  Printed and faxed.

## 2016-10-28 NOTE — ED Notes (Signed)
Ordered lunch tray for pt  

## 2016-10-28 NOTE — ED Notes (Signed)
Spoke with Elnita Maxwellheryl  From [facility].  EKG faxed per request.

## 2016-10-28 NOTE — ED Triage Notes (Signed)
PT ambulatory to BR with one assist.

## 2016-10-28 NOTE — ED Notes (Signed)
Patient ate 25% of Lunch and drink her Coffee.

## 2016-10-29 MED ORDER — ALUM & MAG HYDROXIDE-SIMETH 200-200-20 MG/5ML PO SUSP
15.0000 mL | Freq: Four times a day (QID) | ORAL | Status: DC | PRN
  Administered 2016-10-29 – 2016-10-31 (×4): 15 mL via ORAL
  Filled 2016-10-29 (×4): qty 30

## 2016-10-29 NOTE — ED Notes (Signed)
Meal given

## 2016-10-29 NOTE — Progress Notes (Signed)
CSW [facility] from [facility], Nurse, Asher MuirJamie, who related that patient's family was at the ED and were concerned that they had not received information about the OOP costs for pt's insurance.  Pt had earlier been offered a bed at [facility] but was asked about her financial situation by the nurse and indicated that she could only pay $100.  Patient is likely to not have a complete understanding of her financial and legal status due to her dementia, so her answer was not reliable.  CSW reviewed chart notes and spoke to [place] and [place] at [facility].  [facility] is still willing to [facility] but wanted to be certain patient's family understood their would be an co-pay for hospitalization of approximately $600-700 dollars,  CSW agreed to communicate with pt's daughter, who is her POA, to relate this information.  [facility]W [facility] back and spoke with patient's daughter, Gabriel RungDelores Younts, [phone] who indicated that she would be able to pay patient's co-pay and asked that this writer pursue placement with [facility].  CSW explained that [facility]'s financial staff person would not be in the office until tomorrow and would need to re-certify pt prior to a formal acceptance.    CSW agreed to re-fax referral packet to [facility].  CSW will advise day shift Disposition CSW, Jolan, to follow up with [facility] in the am to receive acceptance information.  CSW will also asked that pt's daughter be contacted with bed offer information. Pt's daughter told this Clinical research associatewriter that she  normally visits the ED in the morning between 8:15 and 9 AM and would need to be reached via the [facility] rather than her personal cell phone during that time period.    Timmothy EulerJean T. Kaylyn LimSutter, MSW, LCSWA Clinical Social Work Disposition [phone]

## 2016-10-29 NOTE — ED Notes (Signed)
[facility] reports [facility] if pt willing to pay deductible out of pocket. Pt reports she thinks she can pay $100. [facility] of [facility] amount, reports they will follow up with [facility] and call back with amount.

## 2016-10-29 NOTE — Progress Notes (Signed)
CSW spoke with MaliJasmine at [facility] who states the [employer]pt's insurance is out of their network, therefore the pt would have to pay out of pocket. CSW spoke with the pt's RN who states the pt has advised her she is unable to pay more than $100 out of pocket. [name] reports the out of pocket cost would be close to $600 or $700. TTS to continue seeking placement.   Princess BruinsAquicha Italia Wolfert, MSW, LCSWA CSW Disposition [phone]

## 2016-10-29 NOTE — ED Notes (Signed)
Family visiting with pt, wanded by security.

## 2016-10-29 NOTE — ED Notes (Signed)
Upon entering pt room, she was found to be tearful stating "Im scared and Im not getting any help". This Clinical research associatewriter assured pt that she was in a safe environment and that we were working to find her placement. Pt nodded and agreed.

## 2016-10-29 NOTE — ED Notes (Signed)
Family reports that Jody Porter is pt POA and handles her finances and pt agrees.  [phone] to be contacted for deductible payment for [facility].

## 2016-10-29 NOTE — Progress Notes (Signed)
Per Delorise ShinerGrace at [facility], pt was declined but she is unsure why. Brayton CavesJessie reports she will follow up and call back to provide clarity on the reason for the denial.   Princess BruinsAquicha Amelie Caracci, MSW, LCSWA CSW Disposition [phone]

## 2016-10-29 NOTE — ED Triage Notes (Signed)
Pt requesting  Med for reflux.

## 2016-10-29 NOTE — BH Assessment (Signed)
[facility] Assessment Progress Note  Pt reassessed this morning. Pt continues to struggle with uncontrollable crying and demonstrated such during the reassessment. Pt repeatedly said, "I'm lost and scared. I don't know what I need help with, but I need help". Pt was unable to indicate what she was scared of, just that she was scared. When asked if she was feeling any better than the last couple of days, pt cried, "no, no, no". Clinician advised pt that IP treatment was being sought out for her, pt cried, "please, please, please help me". IP treatment still recommended for pt.   Johny ShockSamantha M. Ladona Ridgelaylor, MS, NCC, LPCA Counselor

## 2016-10-29 NOTE — ED Notes (Addendum)
Pt doing TTS at this time. 

## 2016-10-29 NOTE — Progress Notes (Signed)
CSW spoke with Helmut MusterAlicia at [facility] to follow up on patient's review. Helmut Musterlicia stated that they were not able to verify the patient's insurance. CSW faxed insurance verification to [facility]. CSW will follow up.   Baldo DaubJolan Sasuke Yaffe, MSW, LCSWA CSW Disposition [phone]

## 2016-10-29 NOTE — Progress Notes (Signed)
CSW spoke with Leavy CellaJasmine at [facility] regarding patient's review for placement. [name] stated that [facility] is still reviewing patient's chart due to not being able to verify insurance coverage. [name] stated that [facility] will contact CSW when placement is confirmed or denied. CSW will continue to follow.   Baldo DaubJolan Isac Lincks MSW, LCSWA CSW Disposition [phone]

## 2016-10-29 NOTE — ED Notes (Signed)
Car[facility]Jean [facility] able to speak with daughter of pt on phone who states they will pay the deductible out of pocket for pt to go to [facility]. [facility] reports it will be tomorrow before they contact the daughter for payment.

## 2016-10-29 NOTE — ED Triage Notes (Signed)
Pt ambulated to BR with use of walker and one person assist the patient tol. well.

## 2016-10-29 NOTE — ED Notes (Signed)
Pt ambulated to the bathroom and back to room

## 2016-10-29 NOTE — ED Notes (Signed)
Pt given graham crackers and peanut butter.

## 2016-10-30 MED ORDER — ONDANSETRON 4 MG PO TBDP
4.0000 mg | ORAL_TABLET | Freq: Once | ORAL | Status: AC
  Administered 2016-10-30: 4 mg via ORAL
  Filled 2016-10-30: qty 1

## 2016-10-30 NOTE — B[facility] Counselor (Signed)
Pt was reassessed this AM.  Pt presented to the ED on 10/27/16 with altered mental status, reporting auditory hallucination and that people were trying to kill her.  Today, Pt denied these experiences, stating that "It's never been the case that someone was trying to kill me."  She also stated that her auditory hallucinations have abated.  "I just want to go home."

## 2016-10-30 NOTE — ED Notes (Signed)
Pt ambulated to bathroom using walker with RN standing by.

## 2016-10-30 NOTE — ED Notes (Signed)
[facility] that [facility] will require up front out of pocket payment. CSW to look at different hospitals that accept her insurance. They attempted to call daughter, but no answer.

## 2016-10-30 NOTE — ED Notes (Signed)
Regular Diet Ordered for Lunch. 

## 2016-10-30 NOTE — ED Provider Notes (Signed)
Patient is resting comfortably at this time.  Nursing states there are no issues, currently.   Mancel BaleWentz, Torrey Ballinas, MD 10/30/16 1153

## 2016-10-30 NOTE — Progress Notes (Signed)
CSW followed up on the following inpt facilities in order to discuss treatment:  Per [name] states the pt was declined medically due to dementia.   Attempted to contact intake [facility] but no ans, HIPPA compliant voicemail was left for the intake coordinator.   [facility], no ans, unable to leave a voicemail due to no voicemail system set up. Line continues to ring, will attempt to call back.  Attempted to contact [name], no ans, HIPPA compliant voicemail was left for intake coordinator.   CSW will continue seeking inpt treatment.  Princess BruinsAquicha Hiyab Porter, MSW, LCSWA CSW Disposition [phone]

## 2016-10-30 NOTE — ED Notes (Signed)
Pt. Ambulatory with walker and tech stand-by assistance to restroom at this time. Pt. States she doesn't feel like eating and that she just wants to sleep. Pt. Encouraged to eat.

## 2016-10-30 NOTE — Progress Notes (Signed)
[facility] with [facility] and advised the pt is willing to pay her deductible out of pocket per chart. [name] states if the pt is not approved, she would have to pay $1500 up front due to the pt's insurance being out of network for their facility however they are provided some benefits but there is a chance her insurance will not approve the coverage and it is based solely on the insurance.  [name] reports the pt will have to pay $1500/ day if her insurance does not approve the treatment. [name] reports it is up to the pt's insurance to approve or deny the claim and they will not know ahead of time therefore they would expect the pt to pay in advance in the event the insurance does not cover the cost.   CSW attempted to contact the pt's daughter, [name], [phone] but did not receive an answer. Unable to leave a voicemail due to no voicemail system set up. CSW will continue seeking placement at other facilities in the meantime.   Jody BruinsAquicha Jakyla Porter, MSW, LCSWA CSW Disposition [phone]

## 2016-10-30 NOTE — ED Notes (Signed)
Pt. On tele-psych machine with [facility].

## 2016-10-30 NOTE — Progress Notes (Signed)
CSW spoke with Andrey CampanileSandy at [facility] regarding questions for review of placement.   CSW spoke with nurse and patient to confirm that patient can return to family  and that her family will be able to provide transportation at discharge from [facility].   CSW relayed information to WorthingtonSandy at [facility] and she stated that she would have the information reviewed and will contact CSW with update.    Baldo DaubJolan Sony Schlarb MSW, LCSWA CSW Disposition [phone]

## 2016-10-30 NOTE — ED Notes (Signed)
Regular Diet was ordered for Dinner. 

## 2016-10-30 NOTE — ED Notes (Signed)
Patient was given a pack of cookies.

## 2016-10-30 NOTE — ED Notes (Signed)
Pt ambulated to and from bathroom with walker, tolerated well.

## 2016-10-30 NOTE — ED Notes (Signed)
Daughter here to visit.  

## 2016-10-30 NOTE — ED Notes (Signed)
Pt. States she is feeling nauseous. Made EDP aware. Verbal order for 4mg  ODT zofran.

## 2016-10-30 NOTE — ED Notes (Signed)
Pt ate half of spaghetti, no salad or bread.

## 2016-10-30 NOTE — ED Notes (Signed)
Pt. Assisted to restroom with standy-by assist  With walker.

## 2016-10-31 DIAGNOSIS — Z96641 Presence of right artificial hip joint: Secondary | ICD-10-CM

## 2016-10-31 DIAGNOSIS — F0391 Unspecified dementia with behavioral disturbance: Secondary | ICD-10-CM

## 2016-10-31 DIAGNOSIS — F329 Major depressive disorder, single episode, unspecified: Secondary | ICD-10-CM | POA: Diagnosis not present

## 2016-10-31 DIAGNOSIS — D62 Acute posthemorrhagic anemia: Secondary | ICD-10-CM

## 2016-10-31 DIAGNOSIS — F419 Anxiety disorder, unspecified: Secondary | ICD-10-CM

## 2016-10-31 DIAGNOSIS — N3281 Overactive bladder: Secondary | ICD-10-CM

## 2016-10-31 DIAGNOSIS — E785 Hyperlipidemia, unspecified: Secondary | ICD-10-CM | POA: Diagnosis not present

## 2016-10-31 NOTE — ED Notes (Signed)
Pt is tearful and states, "No one wants me".

## 2016-10-31 NOTE — Discharge Instructions (Signed)
It was our pleasure to provide your ER care today - we hope that you feel better.  Follow up with your primary care doctor in the coming week.    Should you wish the explore extended care facility options - discuss with your primary care doctor.  Return to ER if worse, new symptoms, fevers, trouble breathing, or other medical emergency.

## 2016-10-31 NOTE — Progress Notes (Signed)
CSW spoke with pt's daughter Pleas PatriciaJenny Younts ([phone]) regarding D/C.   Daughter is concerned about pt's progressive symptoms, however she agrees with D/C. Daughter stated that pt's son will have to pick her up due to daughter working 12 hours a day.   CSW attempted to contact pt's son at number provided by the daughter, [phone] but no voicemail was able to be left.   CSW will continue to follow.   Baldo DaubJolan Gethsemane Fischler MSW, LCSWA CSW Disposition [phone]

## 2016-10-31 NOTE — Discharge Planning (Signed)
Pt currently active with [facility] for RN/PT services.  Resumption of care requested. Avie [facility]aren Nussbaum [facility] notified.  No DME needs identified at this time.  EDCM provided list of Private Duty Agencies to pt son as requested.

## 2016-10-31 NOTE — ED Notes (Signed)
Pt very anxious and tearful. Keep stating, "Someone help me. Why is this happening? No, No, No." Pt reassured and offered medication. Pt willing to take.

## 2016-10-31 NOTE — ED Notes (Signed)
TTS at the bedside. 

## 2016-10-31 NOTE — ED Provider Notes (Signed)
BH team indicates pt psychiatrically clear for d/c, that sw has made arrangements for family member to pick up in ED/assist.   Pt alert, content, no distress.   Vitals:   10/31/16 0055 10/31/16 0750  BP: (!) 154/63 (!) 152/75  Pulse: 80 88  Resp: 17 18  Temp:  98 F (36.7 C)      Jody Porter, Jody Shadd, MD 10/31/16 1308

## 2016-10-31 NOTE — BH Assessment (Signed)
CSW attempted to contact patient's family (son and daughter).   Called all numbers listed in patient's chart. However all phone numbers were either disconnected or voicemails were full.    CSW will follow up at a later time.   Baldo DaubJolan Shaquon Gropp MSW, LCSWA CSW Disposition [phone]

## 2016-10-31 NOTE — Consult Note (Signed)
Telepsych Consultation   Reason for Consult:  Panic attack Referring Physician:  EDP Patient Identification: Jody Porter MRN:  [id number] Principal Diagnosis: <principal problem not specified> Diagnosis:   Patient Active Problem List   Diagnosis Date Noted  . History of total right hip arthroplasty [Z96.641] 10/12/2016  . Acute blood loss as cause of postoperative anemia [D62] 10/12/2016  . Dementia with behavioral disturbance [F03.91] 10/12/2016  . OAB (overactive bladder) [N32.81] 10/12/2016  . HLD (hyperlipidemia) [E78.5] 10/12/2016  . OA (osteoarthritis) of hip [M16.9] 10/02/2016  . Anxiety [F41.9]   . Abdominal discomfort [R10.9]   . Hypercalcemia [E83.52]   . Word finding difficulty [R47.89]   . TIA (transient ischemic attack) [G45.9] 08/11/2016  . MRSA (methicillin resistant Staphylococcus aureus) infection [A49.02] 07/22/2016  . Infection of finger [L08.9] 07/21/2016  . Cellulitis of hand [L03.119] 07/19/2016  . Prediabetes [R73.03] 07/19/2016  . MDD (major depressive disorder), recurrent episode, severe (HCC) [F33.2] 04/01/2016  . Memory difficulties [R41.3] 01/27/2014  . Solitary pulmonary nodule [R91.1] 01/19/2014  . Other specified hypothyroidism [E03.8] 11/23/2013  . Depression [F32.9] 11/23/2013  . Essential hypertension, benign [I10] 11/23/2013  . Low urine output [R34] 11/23/2013  . Other malaise and fatigue [R53.81, R53.83] 11/23/2013  . Cough [R05] 05/12/2013  . Severe obesity (BMI >= 40) (HCC) [E66.01] 01/08/2013  . ALLERGIC RHINITIS [J30.9] 08/21/2009  . INSOMNIA WITH SLEEP APNEA UNSPECIFIED [G47.00, G47.30] 08/18/2009  . Hypothyroidism [E03.9] 02/24/2007  . DEPRESSION [F32.9] 02/24/2007  . Essential hypertension [I10] 02/24/2007  . GERD [K21.9] 02/24/2007  . LOW BACK PAIN [M54.5] 02/24/2007  . HYPERPARATHYROIDISM, HX OF [id number], Z86.2] 02/24/2007    Total Time spent with patient: 30 minutes  Subjective:   Jody Porter is a 81 y.o.  female patient admitted with panic attack and depression.  HPI:  Per tele assessment note on chart written by Charleston Ropes, LCSW, on 10-27-16: Jody Porter is an 81 y.o. female who presents to [facility] accompanied by her son. Her son brought her to the ED after a severe panic attack. Pt reports crying and yelling "no, no, no" uncontrollably. She is unable to identify triggers for panic attack. Pt demonstrated difficulties expressing herself through speech. She states she cannot find the words and sometimes says random words or statements that are not understandable. Pt endorses [facility]. She reports seeing a person on her right and hearing voices but cannot understand what they are saying. Pt states "it was going to kill me." She states someone was going to kill her with rope or thread. She also reports her leg was making noises but was unable to elaborate. Son and daughter were present during assessment with pt's permission. Her children state she started experiencing speech difficulties and severe panic attacks in March, 2018. She was hospitalized at [facility]. Also, she reports receiving a hip replacement recently. They report witnessing panic attacks a few times per week but this is the second severe panic attack. "We can't get her to stop crying." After discharge from [facility], she was referred to Dr. Donell Beers but only seen him once. She is receiving her medications from her primary care physician. Son reports pt has not taken her medications in a few days and is taking a lot of Benadryl lately. Son organizes pt's medications in a weekly pill organizer and pt takes them on her own. Pt lives with son and can return home upon discharge. Pt denies SI, HI and substance abuse.   Today during tele psych consult: Pt was  seen and chart reviewed. Pt was tearful,  anxious and stuttering. Pt was alert and oriented x 3, Pt was sitting on the bedside rocking back and forth and shaking her head. Pt kept  repeating, " I am scared, Nobody is helping me, I can't get any help, I don't know what is going on, I am scared, I want to go home."  Pt denies suicidal/homicidal ideation, denies auditory/visual hallucinations and does not appear to be responding to internal stimuli. Pt stated she lives with her son and wants to go home. Pt has a diagnosis of Alzheimer's Dementia with Behavioral disturbance and is taking Namenda for this. Pt underwent hip surgery in May and spent three weeks in a rehabilitation facility, discharged home on June 13. On June 17 her son, with whom she lives, brought her to the ED stating she wasn't taking her medications and had a panic attack. Pt has had multiple changes in her living situation recently. This coupled with a recent surgery could possibly be contributing to her confusion and anxiety. Pt's son arranges her medications in a pill box but Pt is taking them on her own. Pt's family would need to take closer responsibility for Pt's medication administration and keep all medications locked up. Pt may need a higher level of care at this time due to the reality that her behavior could possibly indicate a progression of her alzheimer's dementia.   Discussed Pt with treatment team who recommend that Pt be discharged home with family. TTS weill have hospital social work assist family with placement options. Pt's family has been made aware of disposition and are willing to take her home.    Past Psychiatric History: MDD, Depression  Risk to Self: Suicidal Ideation: No Suicidal Intent: No Is patient at risk for suicide?: No Suicidal Plan?: No Access to Means: No Intentional Self Injurious Behavior: None Risk to Others: Homicidal Ideation: No Thoughts of Harm to Others: No Current Homicidal Intent: No Current Homicidal Plan: No Access to Homicidal Means: No History of harm to others?: No Assessment of Violence: None Noted Does patient have access to weapons?: No Criminal Charges  Pending?: No Does patient have a court date: No Prior Inpatient Therapy: Prior Inpatient Therapy: Yes Prior Therapy Dates: 07/2016 Prior Therapy Facilty/Provider(s): [place]  Reason for Treatment: Psychosis  Prior Outpatient Therapy: Prior Outpatient Therapy: No Does patient have an ACCT team?: No Does patient have Intensive In-House Services?  : No Does patient have [facility] services? : No Does patient have P4CC services?: No  Past Medical History:  Past Medical History:  Diagnosis Date  . Allergic rhinitis   . Anxiety    hospitalized 03/2016 per daughter   . Degenerative arthritis   . Depression   . Dyspnea    with exertion   . Finger infection    07/2016 for mrsa per daughter but has cleared   . GERD (gastroesophageal reflux disease)   . Hyperparathyroidism   . Hypertension   . Hypothyroid   . Insomnia    w/ sleep apnea NPSG 07/13/2009 AHI 3.5, RDI 29.9/hr  . LBP (low back pain)   . Macular edema    OD  . Memory difficulties 01/27/2014  . Obesity   . Pre-diabetes   . Prediabetes   . Sleep apnea   . Urinary incontinence     Past Surgical History:  Procedure Laterality Date  . carpel tunnel Bilateral   . cataracts    . CESAREAN SECTION    . CHOLECYSTECTOMY    .  eyelid surgery     . gas bubble in right eye for partial retinal detachment surgery     . I&D EXTREMITY Left 07/19/2016   Procedure: IRRIGATION AND DEBRIDEMENT LEFT LONG FINGER;  Surgeon: Betha Loa, MD;  Location: [facility] ORS;  Service: Orthopedics;  Laterality: Left;  . KNEE SURGERY Right    reports multiple surgery on knee  . LUMBAR DISC SURGERY    . PARATHYROIDECTOMY    . TEE WITHOUT CARDIOVERSION N/A 08/14/2016   Procedure: TRANSESOPHAGEAL ECHOCARDIOGRAM (TEE);  Surgeon: Chrystie Nose, MD;  Location: [facility] ENDOSCOPY;  Service: Cardiovascular;  Laterality: N/A;  . TOTAL HIP ARTHROPLASTY Right 10/02/2016   Procedure: RIGHT TOTAL HIP ARTHROPLASTY ANTERIOR APPROACH;  Surgeon: [name], MD;  Location: [facility]  ORS;  Service: Orthopedics;  Laterality: Right;   Family History:  Family History  Problem Relation Age of Onset  . Cancer Mother   . Seizures Mother        grand mal  . Cancer Father        prostate  . Arthritis/Rheumatoid Sister   . Cancer Paternal Grandmother   . Cancer Maternal Grandmother    Family Psychiatric  History: Unknown Social History:  History  Alcohol Use No     History  Drug Use No    Social History   Social History  . Marital status: Widowed    Spouse name: N/A  . Number of children: 4  . Years of education: 11th   Occupational History  . retired/disabled d/t knee injury    Social History Main Topics  . Smoking status: Never Smoker  . Smokeless tobacco: Never Used  . Alcohol use No  . Drug use: No  . Sexual activity: Not Asked   Other Topics Concern  . None   Social History Narrative   Pt is widowed and retired. Lives alone. Has children.   Additional Social History:    Allergies:   Allergies  Allergen Reactions  . Aricept [Donepezil Hcl] Other (See Comments)    Nightmares  . Sudafed [Pseudoephedrine Hcl] Other (See Comments)    Hyperactive and agitated Hyperactive and agitated, congestion   . Sugar-Protein-Starch Other (See Comments)    Loses voice, congestion  . Ciprofloxacin Other (See Comments)    Stomach pains  . Prednisone Other (See Comments)    Marked mentation/ mood change  . Valium [Diazepam] Other (See Comments)    Altered mental status changes-significant problems  . Hydroxyzine Other (See Comments)    Hallucinations   . Mupirocin Other (See Comments)    Flushing, severe coughing, gagging  . Other     PT IS OF JEHOVAH WITNESS FAITH. NO BLOOD PRODUCTS.   Marland Kitchen Acyclovir Rash  . Penicillins Itching and Rash    Has patient had a PCN reaction causing immediate rash, facial/tongue/throat swelling, SOB or lightheadedness with hypotension: Yes Has patient had a PCN reaction causing severe rash involving mucus membranes or  skin necrosis: No Has patient had a PCN reaction that required hospitalization No Has patient had a PCN reaction occurring within the last 10 years: No If all of the above answers are "NO", then may proceed with Cephalosporin use.     Labs: No results found for this or any previous visit (from the past 48 hour(s)).  Current Facility-Administered Medications  Medication Dose Route Frequency Provider Last Rate Last Dose  . acetaminophen (TYLENOL) tablet 1,000 mg  1,000 mg Oral BID PRN Graciella Freer A, PA-C   1,000 mg at 10/29/16 2211  .  alum & mag hydroxide-simeth (MAALOX/MYLANTA) 200-200-20 MG/5ML suspension 15 mL  15 mL Oral Q6H PRN Tilden Fossa, MD   15 mL at 10/30/16 1747  . clopidogrel (PLAVIX) tablet 75 mg  75 mg Oral Daily Graciella Freer A, PA-C   75 mg at 10/30/16 0949  . LORazepam (ATIVAN) tablet 0.5 mg  0.5 mg Oral TID Graciella Freer A, PA-C   0.5 mg at 10/30/16 2205  . memantine (NAMENDA) tablet 10 mg  10 mg Oral BID Graciella Freer A, PA-C   10 mg at 10/30/16 2205  . QUEtiapine (SEROQUEL) tablet 25 mg  25 mg Oral QHS Graciella Freer A, PA-C   25 mg at 10/30/16 2205   Current Outpatient Prescriptions  Medication Sig Dispense Refill  . acetaminophen (TYLENOL) 500 MG tablet Take 1,000 mg by mouth 2 (two) times daily as needed for mild pain.    . Biotin (BIOTIN 5000) 5 MG CAPS Take 1 capsule by mouth daily.    . carboxymethylcellulose (REFRESH PLUS) 0.5 % SOLN Place 1 drop into both eyes 2 (two) times daily as needed (dry eyes).    . clopidogrel (PLAVIX) 75 MG tablet Take 1 tablet (75 mg total) by mouth daily. 30 tablet 1  . DULoxetine (CYMBALTA) 30 MG capsule Take 1 capsule by mouth daily.   3  . levothyroxine (SYNTHROID, LEVOTHROID) 112 MCG tablet TAKE 1 TABLET (112 MCG TOTAL) BY MOUTH DAILY. 30 tablet 0  . memantine (NAMENDA) 10 MG tablet Take 10 mg by mouth 2 (two) times daily.   2  . methocarbamol (ROBAXIN) 500 MG tablet Take 1 tablet (500 mg total) by mouth every 6 (six)  hours as needed for muscle spasms. 60 tablet 1  . methocarbamol (ROBAXIN) 500 MG tablet Take 500 mg by mouth 3 (three) times daily.    . mirtazapine (REMERON) 30 MG tablet Take 30 mg by mouth at bedtime.     Marland Kitchen omega-3 acid ethyl esters (LOVAZA) 1 g capsule Take 1 capsule by mouth daily.     Marland Kitchen oxybutynin (DITROPAN-XL) 5 MG 24 hr tablet Take 1 tablet by mouth daily.  3  . oxyCODONE (OXY IR/ROXICODONE) 5 MG immediate release tablet Take 5-10 mg by mouth every 6 (six) hours as needed for moderate pain or severe pain. Take 5 mg for moderate pain and 10 mg for severe pain    . QUEtiapine (SEROQUEL) 25 MG tablet Take 25 mg by mouth at bedtime.    . ranitidine (ZANTAC) 300 MG tablet Take 300 mg by mouth at bedtime.     . sucralfate (CARAFATE) 1 g tablet TAKE 1 TABLET (1 GRAM) BY MOUTH tWICE DAILY  0  . LORazepam (ATIVAN) 0.5 MG tablet Take 0.5 mg by mouth 3 (three) times daily.   3  . pravastatin (PRAVACHOL) 40 MG tablet Take 1 tablet (40 mg total) by mouth daily. (Patient not taking: Reported on 10/27/2016) 30 tablet 1    Musculoskeletal: Unable to assess: camera  Psychiatric Specialty Exam: Physical Exam  Review of Systems  Psychiatric/Behavioral: Positive for depression and memory loss (Alzheimer's dementia). Negative for hallucinations, substance abuse and suicidal ideas. The patient is nervous/anxious. The patient does not have insomnia.     Blood pressure (!) 152/75, pulse 88, temperature 98 F (36.7 C), temperature source Oral, resp. rate 18, height 4\' 9"  (1.448 m), weight 90.7 kg (200 lb), SpO2 100 %.Body mass index is 43.28 kg/m.  General Appearance: Casual  Eye Contact:  Poor  Speech:  mumbling  Volume:  Normal  Mood:  Anxious and Depressed  Affect:  Tearful  Thought Process:  Disorganized  Orientation:  Full (Time, Place, and Person)  Thought Content:  Rumination  Suicidal Thoughts:  No  Homicidal Thoughts:  No  Memory:  Immediate;   Poor Recent;   Poor Remote;   Poor   Judgement:  Impaired  Insight:  Lacking  Psychomotor Activity:  Increased  Concentration:  Concentration: Fair and Attention Span: Fair  Recall:  FiservFair  Fund of Knowledge:  Fair  Language:  Good  Akathisia:  No  Handed:  Right  AIMS (if indicated):     Assets:  ArchitectCommunication Skills Financial Resources/Insurance Housing Social Support  ADL's:  Intact  Cognition:  Impaired,  Moderate  Sleep:        Treatment Plan Summary: Discharge Home with family  Follow up with Social Work for placement options Follow up with PCP for medication management and any new or existing medical concerns Take all medications as prescribed Family to administer all medications and keep them away from patient, this includes over the counter medications   Disposition: No evidence of imminent risk to self or others at present.   Patient does not meet criteria for psychiatric inpatient admission. Supportive therapy provided about ongoing stressors. Discussed crisis plan, support from social network, calling 911, coming to the Emergency Department, and calling Suicide Hotline.  Laveda AbbeLaurie Britton Parks, NP 10/31/2016 9:23 AM

## 2016-10-31 NOTE — ED Notes (Signed)
Pt is in stable condition upon d/c and is escorted from ED via wheelchair. 

## 2016-10-31 NOTE — ED Notes (Signed)
MD to evaluate pt's MAR

## 2016-12-23 ENCOUNTER — Other Ambulatory Visit: Payer: Self-pay | Admitting: Gastroenterology

## 2017-01-03 NOTE — Progress Notes (Signed)
01/02/2017- number not in service.   01/03/2017- number not in service to complete endoscopy call.

## 2017-01-17 ENCOUNTER — Ambulatory Visit (HOSPITAL_COMMUNITY): Admission: RE | Admit: 2017-01-17 | Payer: Medicare Other | Source: Ambulatory Visit | Admitting: Gastroenterology

## 2017-01-17 ENCOUNTER — Encounter (HOSPITAL_COMMUNITY): Admission: RE | Payer: Self-pay | Source: Ambulatory Visit

## 2017-01-17 SURGERY — ESOPHAGOGASTRODUODENOSCOPY (EGD) WITH PROPOFOL
Anesthesia: Monitor Anesthesia Care

## 2017-06-13 DEATH — deceased

## 2018-09-23 IMAGING — DX DG PORTABLE PELVIS
1 series · 1 of 1 positions shown · non-contrast
Comparison: PET-CT 12/22/2013.  CT 12/15/2013 .

CLINICAL DATA: Right hip replacement.

EXAM:
PORTABLE PELVIS 1-2 VIEWS

[pelvis ap]
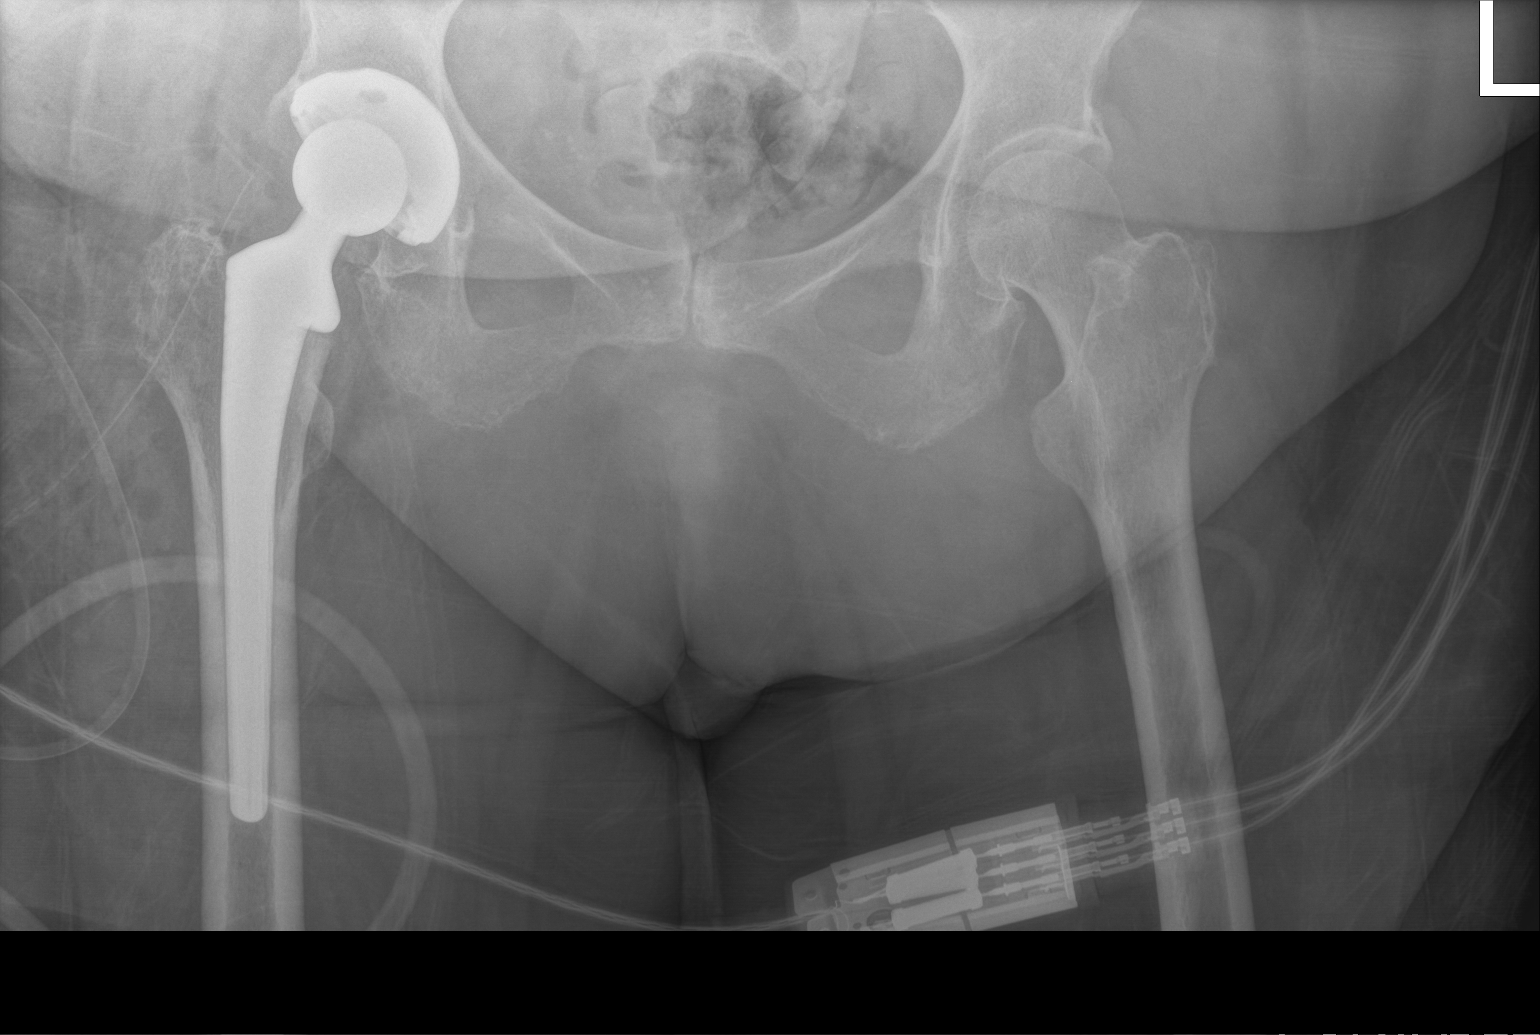

[1 of 1 positions shown; findings below may reference images not displayed]

FINDINGS: Total right hip replacement.  Hardware intact.  Anatomic alignment.
IMPRESSION: Total right hip replacement with anatomic alignment.
